# Patient Record
Sex: Female | Born: 1960 | Race: White | Hispanic: No | Smoking: Current every day smoker
Health system: Southern US, Community
[De-identification: ages and names within clinical notes are randomized; demographics above are authoritative.]

## PROBLEM LIST (undated history)

## (undated) DIAGNOSIS — J449 Chronic obstructive pulmonary disease, unspecified: Secondary | ICD-10-CM

## (undated) DIAGNOSIS — I639 Cerebral infarction, unspecified: Secondary | ICD-10-CM

## (undated) DIAGNOSIS — S2239XA Fracture of one rib, unspecified side, initial encounter for closed fracture: Secondary | ICD-10-CM

## (undated) HISTORY — PX: ESOPHAGEAL DILATION: SHX303

---

## 1997-09-11 ENCOUNTER — Emergency Department (HOSPITAL_COMMUNITY): Admission: EM | Admit: 1997-09-11 | Discharge: 1997-09-11 | Payer: Self-pay | Admitting: Emergency Medicine

## 1999-05-05 ENCOUNTER — Emergency Department (HOSPITAL_COMMUNITY): Admission: EM | Admit: 1999-05-05 | Discharge: 1999-05-05 | Payer: Self-pay | Admitting: Emergency Medicine

## 1999-08-30 ENCOUNTER — Ambulatory Visit (HOSPITAL_BASED_OUTPATIENT_CLINIC_OR_DEPARTMENT_OTHER): Admission: RE | Admit: 1999-08-30 | Discharge: 1999-08-30 | Payer: Self-pay | Admitting: *Deleted

## 1999-10-04 ENCOUNTER — Ambulatory Visit (HOSPITAL_BASED_OUTPATIENT_CLINIC_OR_DEPARTMENT_OTHER): Admission: RE | Admit: 1999-10-04 | Discharge: 1999-10-04 | Payer: Self-pay | Admitting: Anesthesiology

## 1999-12-14 ENCOUNTER — Emergency Department (HOSPITAL_COMMUNITY): Admission: EM | Admit: 1999-12-14 | Discharge: 1999-12-14 | Payer: Self-pay | Admitting: Emergency Medicine

## 2002-08-12 ENCOUNTER — Emergency Department (HOSPITAL_COMMUNITY): Admission: EM | Admit: 2002-08-12 | Discharge: 2002-08-12 | Payer: Self-pay | Admitting: Emergency Medicine

## 2003-12-07 ENCOUNTER — Emergency Department (HOSPITAL_COMMUNITY): Admission: EM | Admit: 2003-12-07 | Discharge: 2003-12-07 | Payer: Self-pay | Admitting: Emergency Medicine

## 2005-11-11 ENCOUNTER — Ambulatory Visit (HOSPITAL_COMMUNITY): Admission: RE | Admit: 2005-11-11 | Discharge: 2005-11-11 | Payer: Self-pay | Admitting: Family Medicine

## 2005-12-02 ENCOUNTER — Encounter: Admission: RE | Admit: 2005-12-02 | Discharge: 2005-12-02 | Payer: Self-pay | Admitting: Gastroenterology

## 2005-12-02 IMAGING — CT CT NECK W/ CM
2 of 4 series · 6 of 14 positions shown, 7 images · IV contrast ([ID] OMNI 300)
Comparison: None

CLINICAL DATA: Dysphagia

NECK CT WITH CONTRAST
TECHNIQUE: Multidetector CT imaging of the neck was performed following the
standard protocol during administration of intravenous contrast.
Contrast:  100 cc Omnipaque 300

[Series 2: neck · axial · 0.39mm/px · z∈[+4,+162]mm · 4 of 71 slices shown, 5 images]
[im 15/71  soft-tissue]
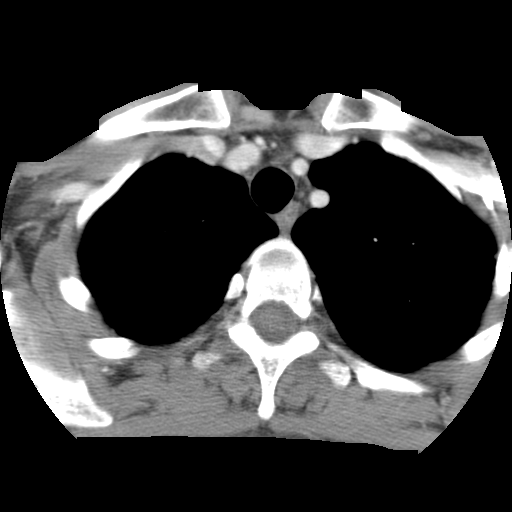
[im 15/71  bone]
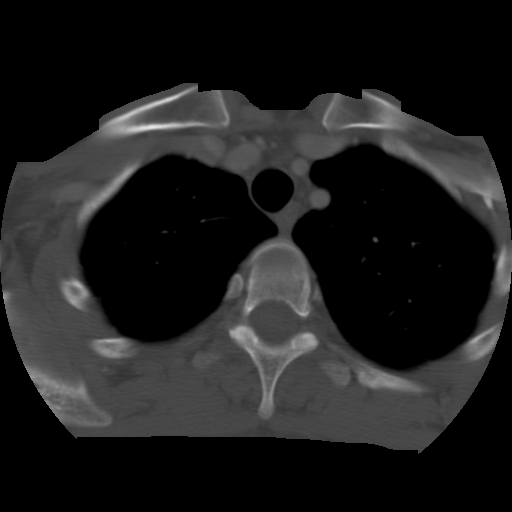
[im 29/71  bone]
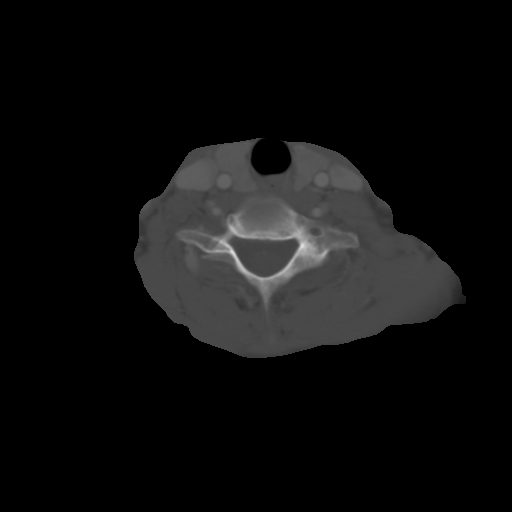
[im 43/71  bone]
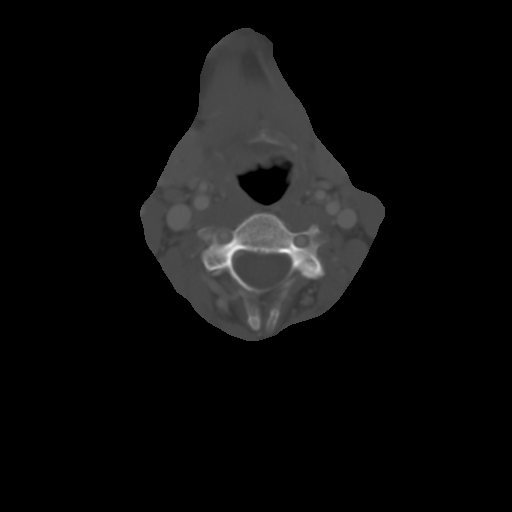
[im 57/71  bone]
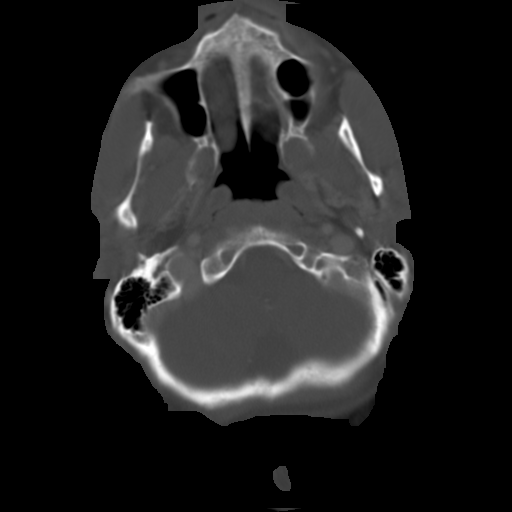

[Series 103: reformatted · sagittal · 0.53mm/px · 2 of 48 slices shown]
[im 16/48  bone]
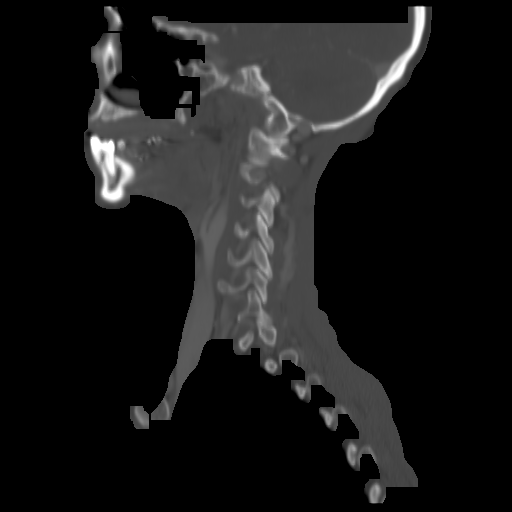
[im 32/48  bone]
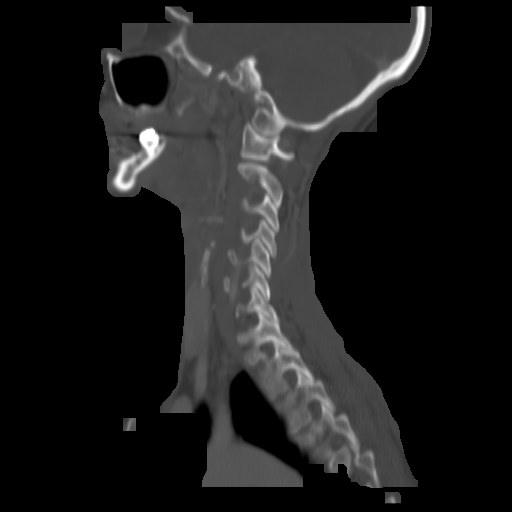

[6 of 14 positions shown; findings below may reference images not displayed]

FINDINGS: The airway is widely patent. The thyroid gland is normal. The upper
esophagus is unremarkable. No cervical adenopathy. Vascular structures are
unremarkable. Lung apices are clear.

IMPRESSION

Negative CT of the neck.

## 2010-04-30 ENCOUNTER — Emergency Department (HOSPITAL_COMMUNITY): Admission: EM | Admit: 2010-04-30 | Discharge: 2010-04-30 | Payer: Self-pay | Admitting: Emergency Medicine

## 2010-04-30 IMAGING — CR DG TOE 4TH 2+V*R*
4 series · 4 of 4 positions shown · non-contrast
Comparison: None.

CLINICAL DATA: Injury and pain.

RIGHT TOE - 2+ VIEW

[t toes ap right]
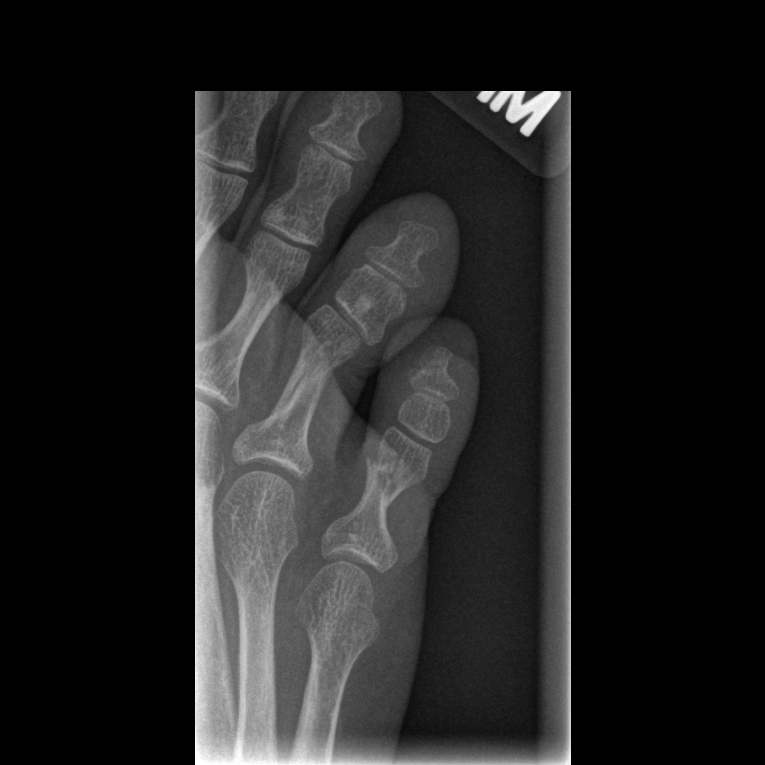

[t toes oblique right (1 of 2)]
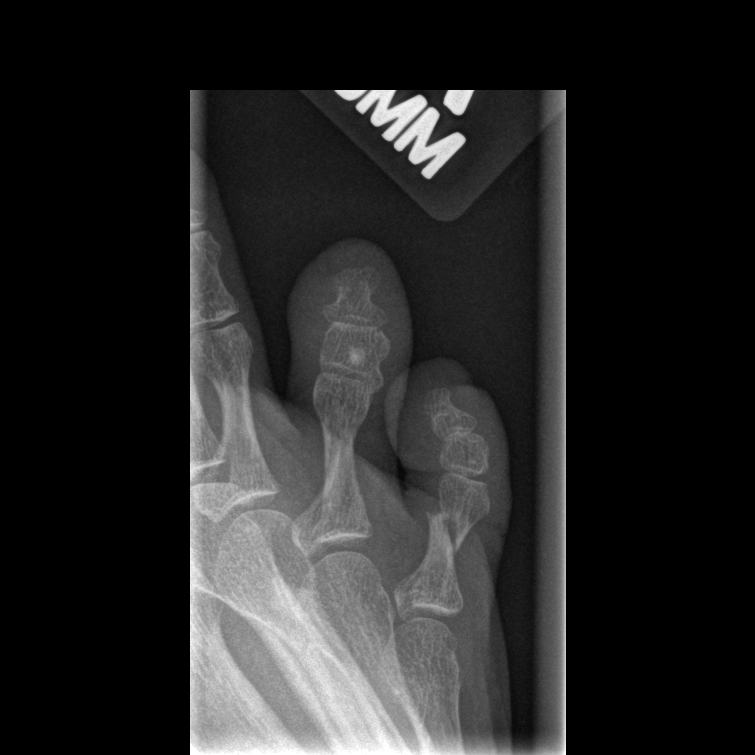

[t toes oblique right (2 of 2)]
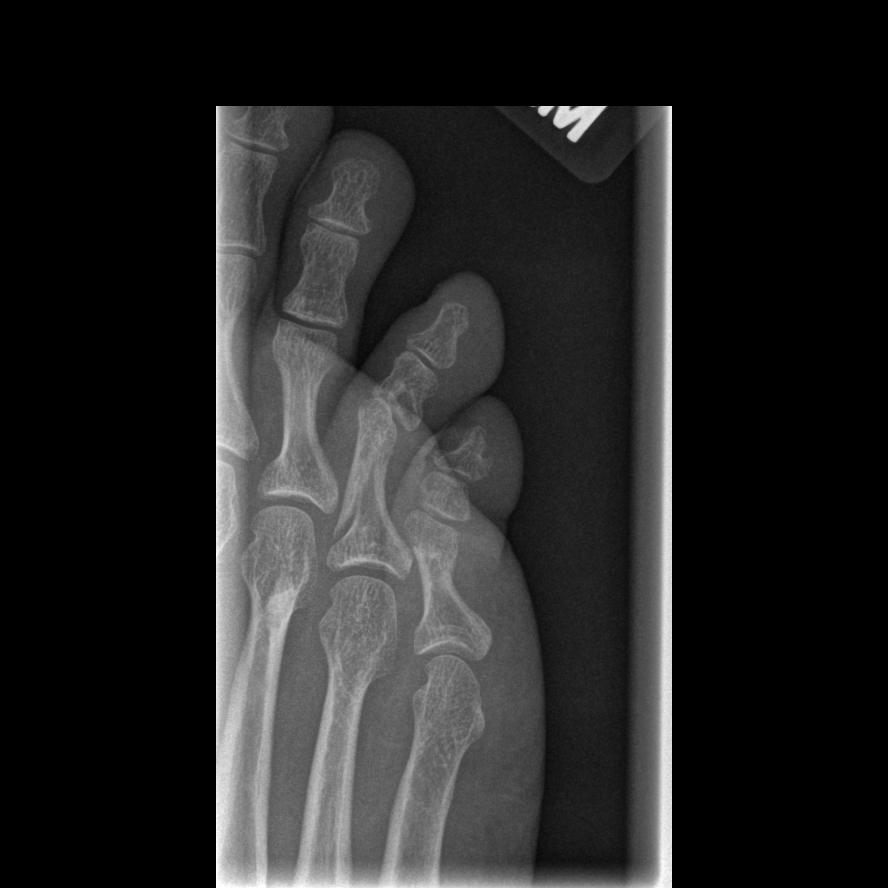

[t toes lateral right]
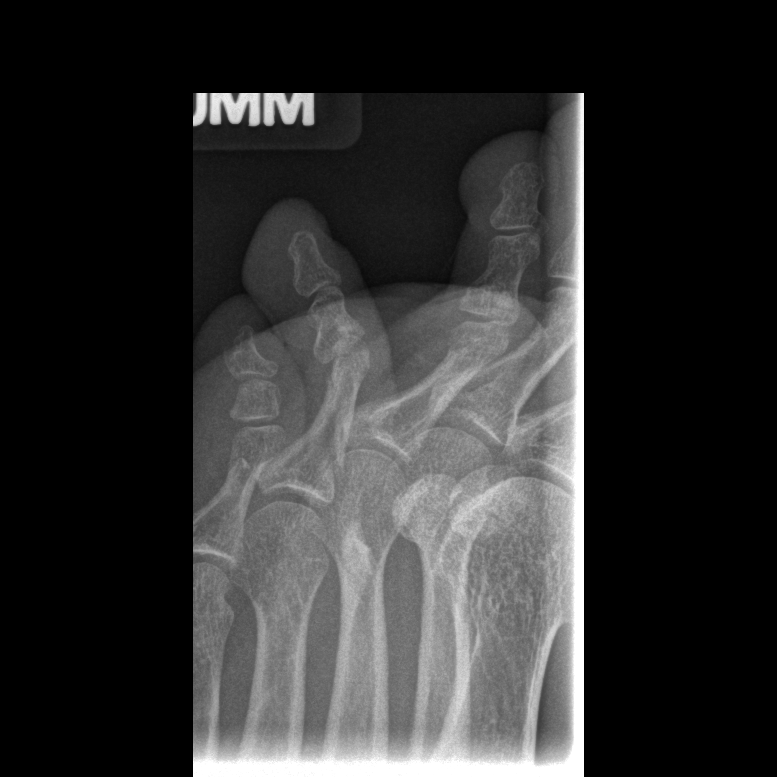

[4 of 4 positions shown; findings below may reference images not displayed]

FINDINGS: There is oblique fracture of the diaphysis of the
proximal phalanx of the right fourth toe.  There is slight medial
displacement and valgus angulation of the distal fracture fragment.
No comminution or dislocation is seen.

There is oblique fracture of the proximal phalanx of the fifth toe.
There is lateral displacement and valgus angulation of the distal
fracture fragment.  No comminution or dislocation is evident.
IMPRESSION: Fractures of the proximal phalanges of the fourth and fifth right
toes.

## 2010-06-27 ENCOUNTER — Encounter: Payer: Self-pay | Admitting: Family Medicine

## 2010-06-27 ENCOUNTER — Encounter: Payer: Self-pay | Admitting: Gastroenterology

## 2011-04-17 ENCOUNTER — Other Ambulatory Visit: Payer: Self-pay

## 2011-04-17 ENCOUNTER — Emergency Department (HOSPITAL_COMMUNITY): Payer: Self-pay

## 2011-04-17 ENCOUNTER — Encounter: Payer: Self-pay | Admitting: Emergency Medicine

## 2011-04-17 ENCOUNTER — Emergency Department (HOSPITAL_COMMUNITY)
Admission: EM | Admit: 2011-04-17 | Discharge: 2011-04-17 | Disposition: A | Discharge status: Home / Self Care | Payer: Self-pay | Attending: Emergency Medicine | Admitting: Emergency Medicine

## 2011-04-17 DIAGNOSIS — R059 Cough, unspecified: Secondary | ICD-10-CM | POA: Insufficient documentation

## 2011-04-17 DIAGNOSIS — R079 Chest pain, unspecified: Secondary | ICD-10-CM | POA: Insufficient documentation

## 2011-04-17 DIAGNOSIS — F17200 Nicotine dependence, unspecified, uncomplicated: Secondary | ICD-10-CM | POA: Insufficient documentation

## 2011-04-17 DIAGNOSIS — K219 Gastro-esophageal reflux disease without esophagitis: Secondary | ICD-10-CM | POA: Insufficient documentation

## 2011-04-17 DIAGNOSIS — S2239XA Fracture of one rib, unspecified side, initial encounter for closed fracture: Secondary | ICD-10-CM | POA: Insufficient documentation

## 2011-04-17 DIAGNOSIS — J029 Acute pharyngitis, unspecified: Secondary | ICD-10-CM | POA: Insufficient documentation

## 2011-04-17 DIAGNOSIS — X58XXXA Exposure to other specified factors, initial encounter: Secondary | ICD-10-CM | POA: Insufficient documentation

## 2011-04-17 DIAGNOSIS — R0989 Other specified symptoms and signs involving the circulatory and respiratory systems: Secondary | ICD-10-CM | POA: Insufficient documentation

## 2011-04-17 DIAGNOSIS — R05 Cough: Secondary | ICD-10-CM | POA: Insufficient documentation

## 2011-04-17 LAB — GLUCOSE, CAPILLARY

## 2011-04-17 IMAGING — CR DG CHEST 2V
2 series · 2 of 2 positions shown · non-contrast
Comparison: None.

CLINICAL DATA: Cough and left-sided chest pain.  Chest congestion.
Fever.

CHEST - 2 VIEW

[w chest pa]
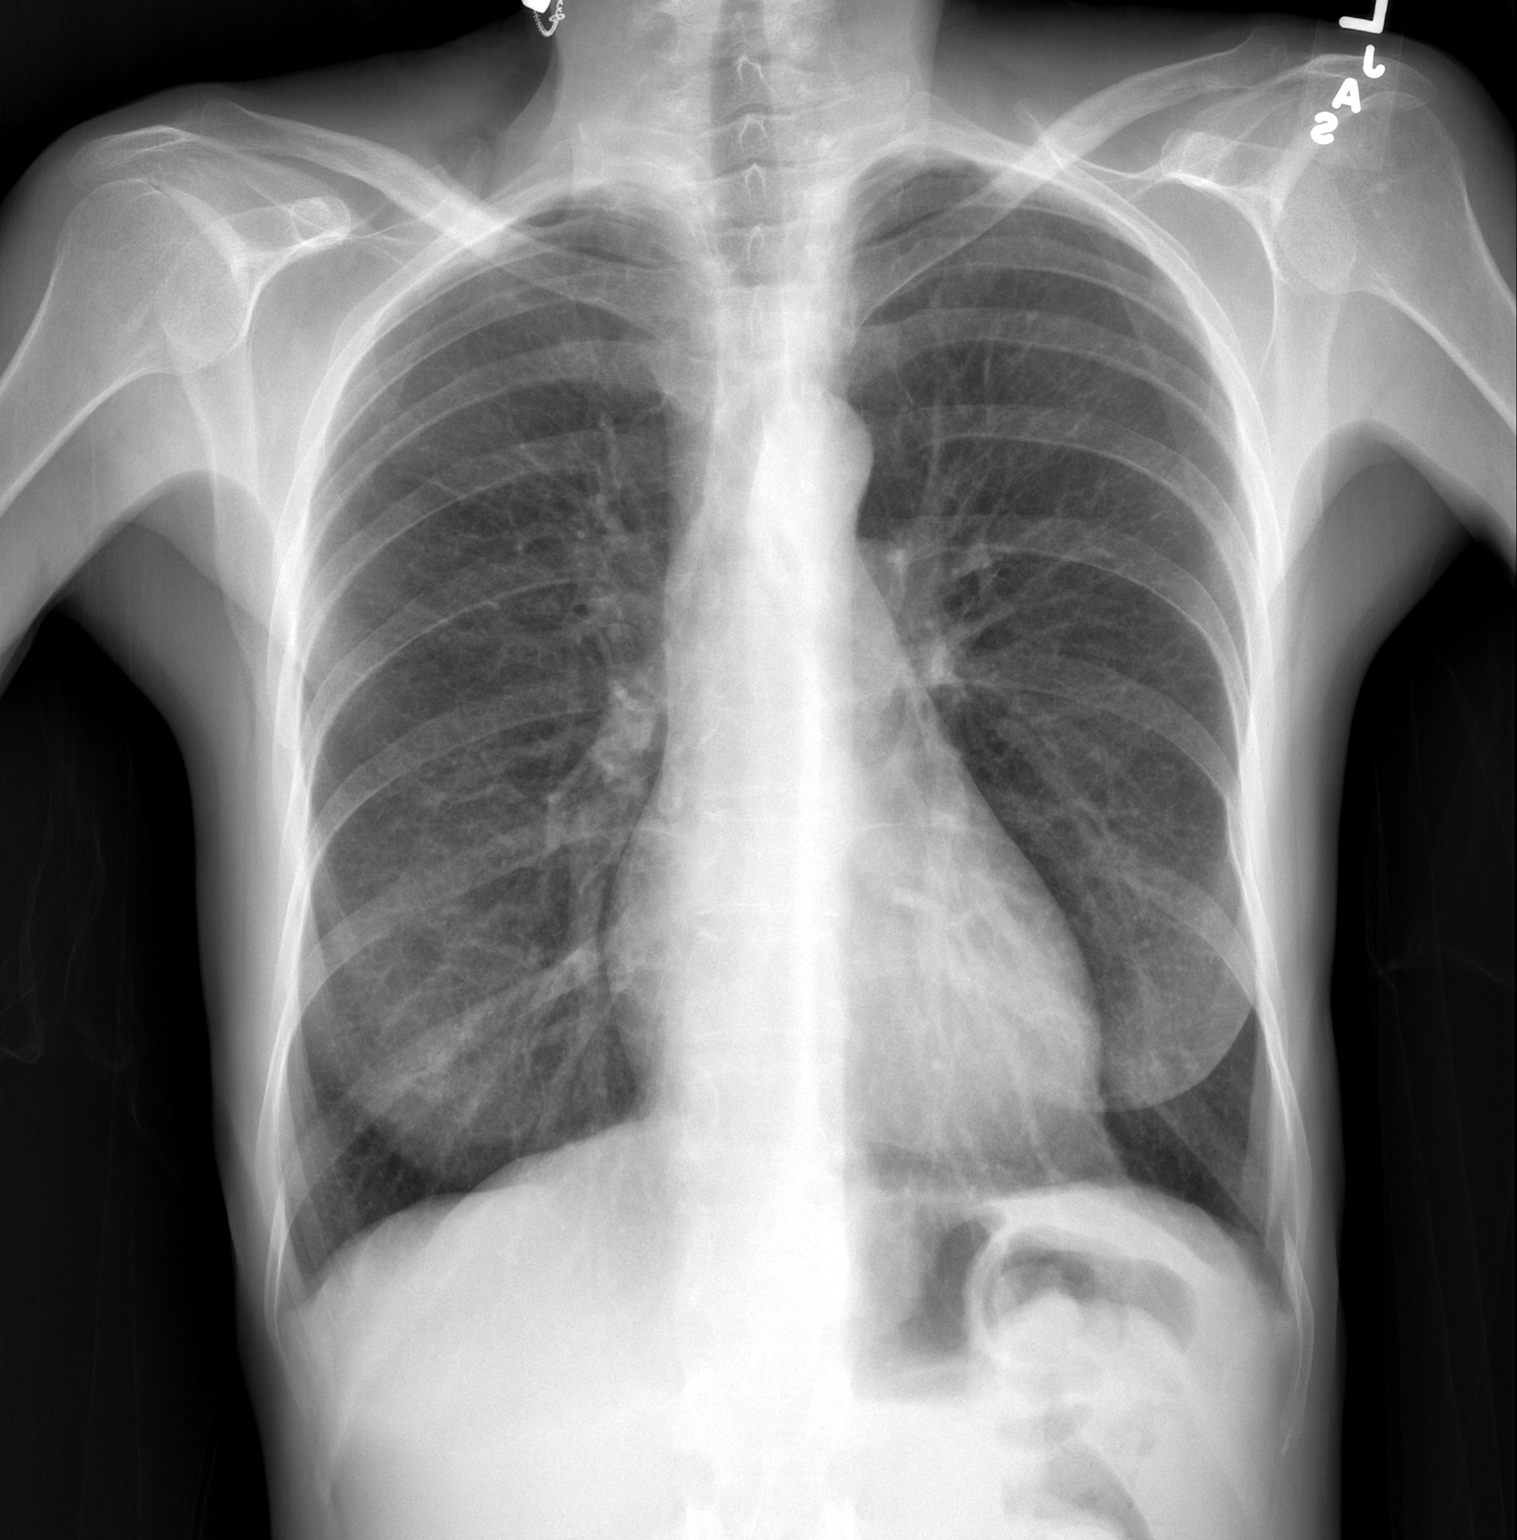

[w chest lat]
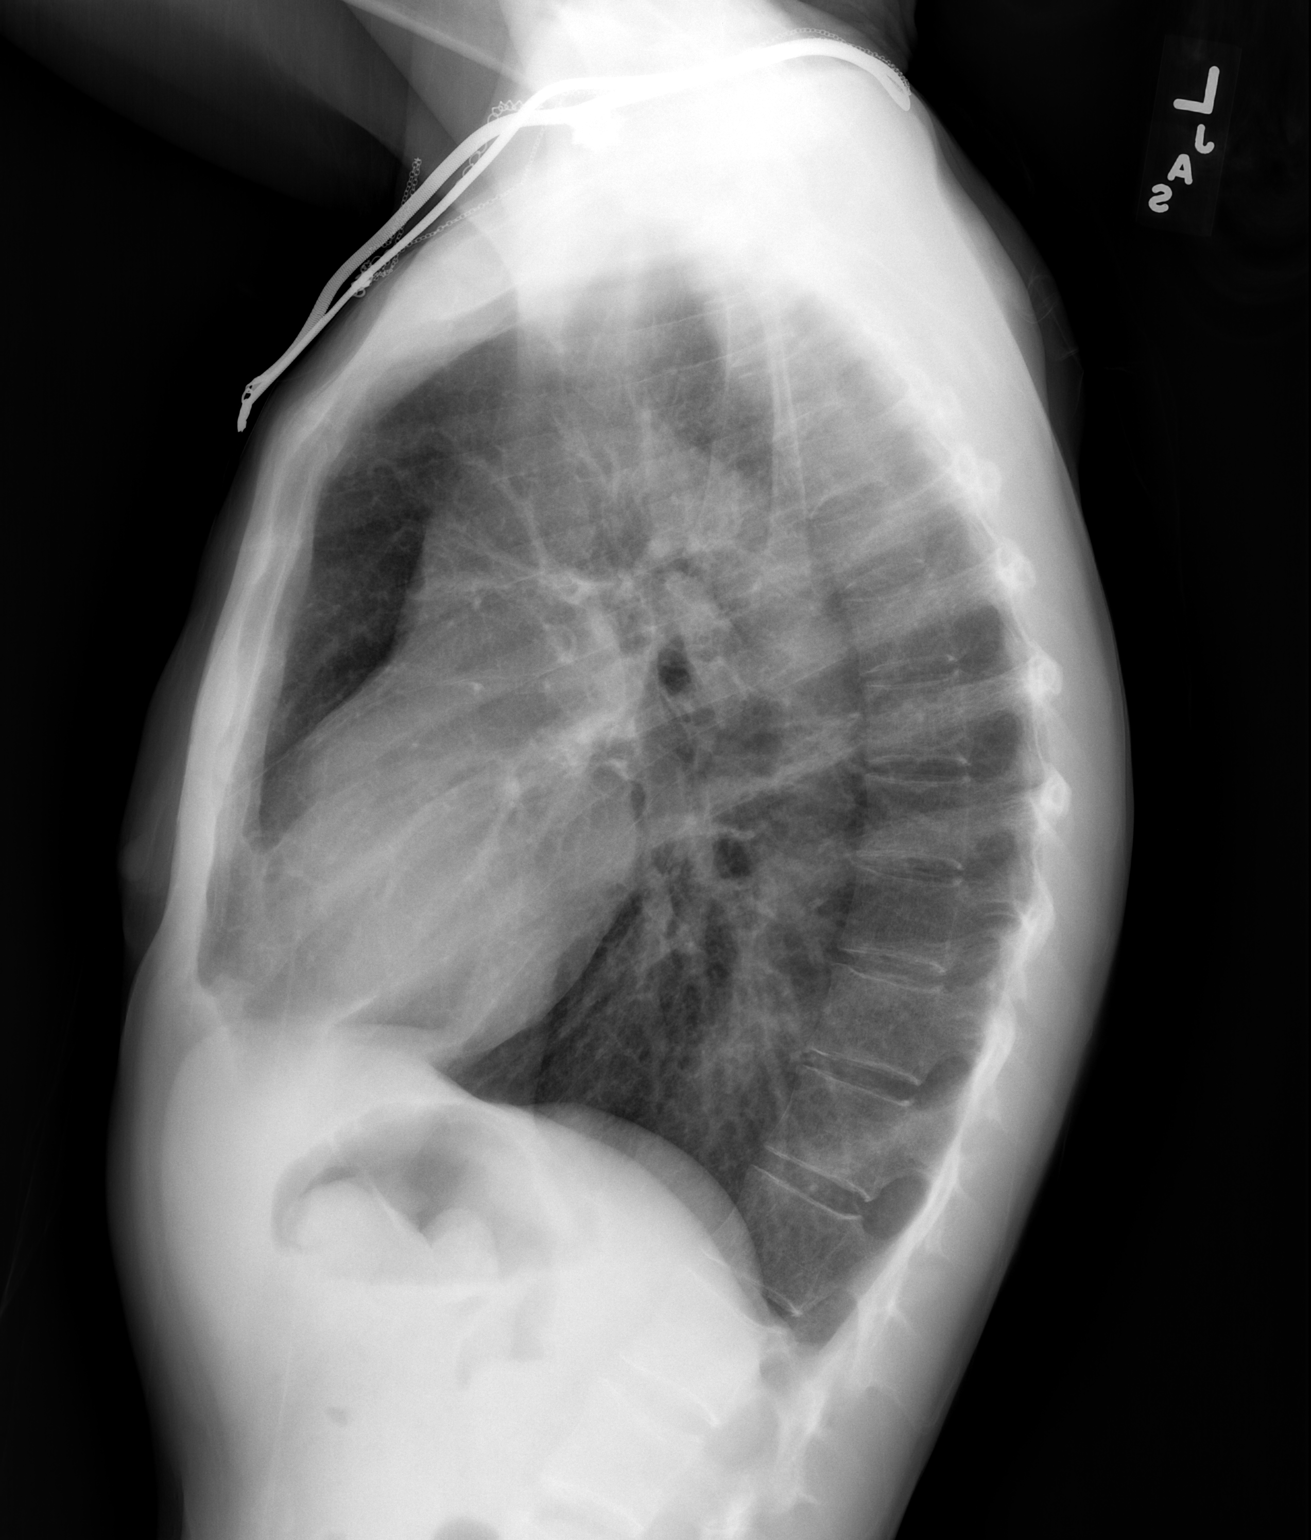

[2 of 2 positions shown; findings below may reference images not displayed]

FINDINGS: The heart size and vascularity are normal and the lungs
are clear although somewhat hyperinflated.

There is a deformity of the lateral aspect of the left eighth rib
which could be due to an acute or old rib fracture but I do not see
any callus formation.

No pneumothorax.
IMPRESSION: Deformity of the left eighth rib laterally which may represent an
acute fracture.

Hyperinflated lungs.

## 2011-04-17 MED ORDER — HYDROCODONE-ACETAMINOPHEN 5-325 MG PO TABS: 2.0000 | ORAL_TABLET | ORAL | Status: AC | PRN

## 2011-04-17 MED ORDER — OMEPRAZOLE 20 MG PO CPDR: 20.0000 mg | DELAYED_RELEASE_CAPSULE | Freq: Every day | ORAL | Status: DC

## 2011-04-17 NOTE — Discharge Instructions (Signed)
Below is a list of medical providers for people who are not currently connected with a physician.  Please utilize it to find a doctor.   RESOURCE GUIDE  Dental Problems  Patients with Medicaid: [facility] [phone]                                           [phone] [address] Phone:  [phone]                                                  Phone:  [phone]  If unable to pay or uninsured, contact:  [facility] or [facility]. to become qualified for the adult dental clinic.  Chronic Pain Problems Contact [facility]  [phone] Patients need to be referred by their primary care doctor.  Insufficient Money for Medicine Contact [employer]:  call "211" or [facility] [phone].  No Primary Care Doctor Call [facility]  [phone] Other agencies that provide inexpensive medical care    [facility]  [phone]    [facility]  [phone]    [facility]  [phone]    [facility]  [phone]    [facility]  [phone]    [facility]  [phone]  Psychological Services [facility]  [phone] [facility] Services  [phone] [facility]   [phone] (emergency services [phone])  Substance Abuse Resources [facility]  [phone] [facility] [phone] [facility] [phone] Floydene Flock [phone] Residential & Outpatient Substance Abuse Program  [phone]  Abuse/Neglect [facility] Child Abuse Hotline [phone] [facility] Child Abuse Hotline (657)595-7708 (After Hours)  Emergency Shelter [facility] 458 600 2011  Maternity Homes [facility] at the [place] [facility] [place] [phone] [facility] [phone]  MRSA Hotline #:   405-324-5944    [facility] Resources  [facility] [place]      [employer]                          [place] Dept. [address] [place]                       [address]      [address]  [place]                                                Cristobal Goldmann Phone:  [phone]                                   Phone:  [phone]  Phone:  [place] Phone:  [place] [phone] (After Hours)

## 2011-04-17 NOTE — ED Provider Notes (Signed)
History     CSN: [id number] Arrival date & time: 04/17/2011  1:03 PM   First MD Initiated Contact with Patient 04/17/11 1636      Chief Complaint  Patient presents with  . Sore Throat    (Consider location/radiation/quality/duration/timing/severity/associated sxs/prior treatment) Patient is a 50 y.o. female presenting with pharyngitis. The history is provided by the patient.  Sore Throat The current episode started more than 2 days ago. The problem has been gradually worsening. Associated symptoms include chest pain. Pertinent negatives include no shortness of breath. The symptoms are aggravated by coughing and smoking. The symptoms are relieved by nothing.    History reviewed. No pertinent past medical history.  Past Surgical History  Procedure Date  . Esophageal dilation     No family history on file.  History  Substance Use Topics  . Smoking status: Current Everyday Smoker  . Smokeless tobacco: Not on file  . Alcohol Use: Yes    OB History    Grav Para Term Preterm Abortions TAB SAB Ect Mult Living                  Review of Systems  Constitutional: Negative for fever.  Eyes: Negative.   Respiratory: Positive for cough. Negative for shortness of breath.   Cardiovascular: Positive for chest pain.  Genitourinary: Negative.   All other systems reviewed and are negative.    Allergies  Review of patient's allergies indicates no known allergies.  Home Medications   Current Outpatient Rx  Name Route Sig Dispense Refill  . THERA M PLUS PO TABS Oral Take 1 tablet by mouth daily.      Marland Kitchen VITAMIN B-12 100 MCG PO TABS Oral Take 50 mcg by mouth daily.        BP 102/73  Pulse 79  Temp(Src) 98.9 F (37.2 C) (Oral)  Resp 16  SpO2 100%  Physical Exam  Nursing note and vitals reviewed. Constitutional: She is oriented to person, place, and time. She appears well-developed and well-nourished. No distress.  HENT:  Head: Normocephalic and atraumatic.  Eyes:  Pupils are equal, round, and reactive to light.  Neck: Normal range of motion.  Cardiovascular: Normal rate and intact distal pulses.          Date: 04/17/2011  Rate: 85  Rhythm: normal sinus rhythm  QRS Axis: normal  Intervals: normal  ST/T Wave abnormalities: normal  Conduction Disutrbances:none  Narrative Interpretation: Normal EKG  Old EKG Reviewed: none available     Pulmonary/Chest: No respiratory distress.  Abdominal: Normal appearance. She exhibits no distension.  Musculoskeletal: Normal range of motion.  Neurological: She is alert and oriented to person, place, and time. No cranial nerve deficit.  Skin: Skin is warm and dry. No rash noted.  Psychiatric: She has a normal mood and affect. Her behavior is normal.    ED Course  Procedures (including critical care time)  Labs Reviewed  GLUCOSE, CAPILLARY - Abnormal; Notable for the following:    Glucose-Capillary 128 (*)    All other components within normal limits  POCT CBG MONITORING   Dg Chest 2 View  04/17/2011  *RADIOLOGY REPORT*  Clinical Data: Cough and left-sided chest pain.  Chest congestion. Fever.  CHEST - 2 VIEW  Comparison: None.  Findings: The heart size and vascularity are normal and the lungs are clear although somewhat hyperinflated.  There is a deformity of the lateral aspect of the left eighth rib which could be due to an acute or old rib  fracture but I do not see any callus formation.  No pneumothorax.  IMPRESSION: Deformity of the left eighth rib laterally which may represent an acute fracture.  Hyperinflated lungs.  Original Report Authenticated By: Gwynn Burly, M.D.     1. GERD (gastroesophageal reflux disease)   2. Rib fracture       MDM          Nelia Shi, MD 04/17/11 [phone]

## 2011-04-17 NOTE — ED Notes (Signed)
Pt reports sore throat, chest pain and shortness of breath.

## 2011-08-10 ENCOUNTER — Emergency Department (HOSPITAL_COMMUNITY)
Admission: EM | Admit: 2011-08-10 | Discharge: 2011-08-10 | Disposition: A | Discharge status: Home Health | Payer: Self-pay | Attending: Emergency Medicine | Admitting: Emergency Medicine

## 2011-08-10 ENCOUNTER — Emergency Department (HOSPITAL_COMMUNITY): Payer: Self-pay

## 2011-08-10 ENCOUNTER — Encounter (HOSPITAL_COMMUNITY): Payer: Self-pay | Admitting: *Deleted

## 2011-08-10 DIAGNOSIS — Z720 Tobacco use: Secondary | ICD-10-CM

## 2011-08-10 DIAGNOSIS — R059 Cough, unspecified: Secondary | ICD-10-CM | POA: Insufficient documentation

## 2011-08-10 DIAGNOSIS — R42 Dizziness and giddiness: Secondary | ICD-10-CM | POA: Insufficient documentation

## 2011-08-10 DIAGNOSIS — R0602 Shortness of breath: Secondary | ICD-10-CM | POA: Insufficient documentation

## 2011-08-10 DIAGNOSIS — R5381 Other malaise: Secondary | ICD-10-CM | POA: Insufficient documentation

## 2011-08-10 DIAGNOSIS — R6883 Chills (without fever): Secondary | ICD-10-CM | POA: Insufficient documentation

## 2011-08-10 DIAGNOSIS — R05 Cough: Secondary | ICD-10-CM | POA: Insufficient documentation

## 2011-08-10 DIAGNOSIS — F17200 Nicotine dependence, unspecified, uncomplicated: Secondary | ICD-10-CM | POA: Insufficient documentation

## 2011-08-10 DIAGNOSIS — N390 Urinary tract infection, site not specified: Secondary | ICD-10-CM | POA: Insufficient documentation

## 2011-08-10 DIAGNOSIS — J40 Bronchitis, not specified as acute or chronic: Secondary | ICD-10-CM | POA: Insufficient documentation

## 2011-08-10 HISTORY — DX: Fracture of one rib, unspecified side, initial encounter for closed fracture: S22.39XA

## 2011-08-10 LAB — URINE MICROSCOPIC-ADD ON

## 2011-08-10 LAB — POCT I-STAT, CHEM 8
Calcium, Ion: 1.25 mmol/L (ref 1.12–1.32)
Chloride: 107 mEq/L (ref 96–112)
HCT: 34 % — ABNORMAL LOW (ref 36.0–46.0)
Potassium: 3.3 mEq/L — ABNORMAL LOW (ref 3.5–5.1)

## 2011-08-10 LAB — URINALYSIS, ROUTINE W REFLEX MICROSCOPIC
Leukocytes, UA: NEGATIVE
Nitrite: POSITIVE — AB
Specific Gravity, Urine: 1.028 (ref 1.005–1.030)
pH: 5.5 (ref 5.0–8.0)

## 2011-08-10 IMAGING — CR DG CHEST 2V
2 series · 2 of 2 positions shown · non-contrast
Comparison: Chest x-ray [DATE].

CLINICAL DATA: Cough, shortness of breath.  History of smoking.

CHEST - 2 VIEW

[w chest pa]
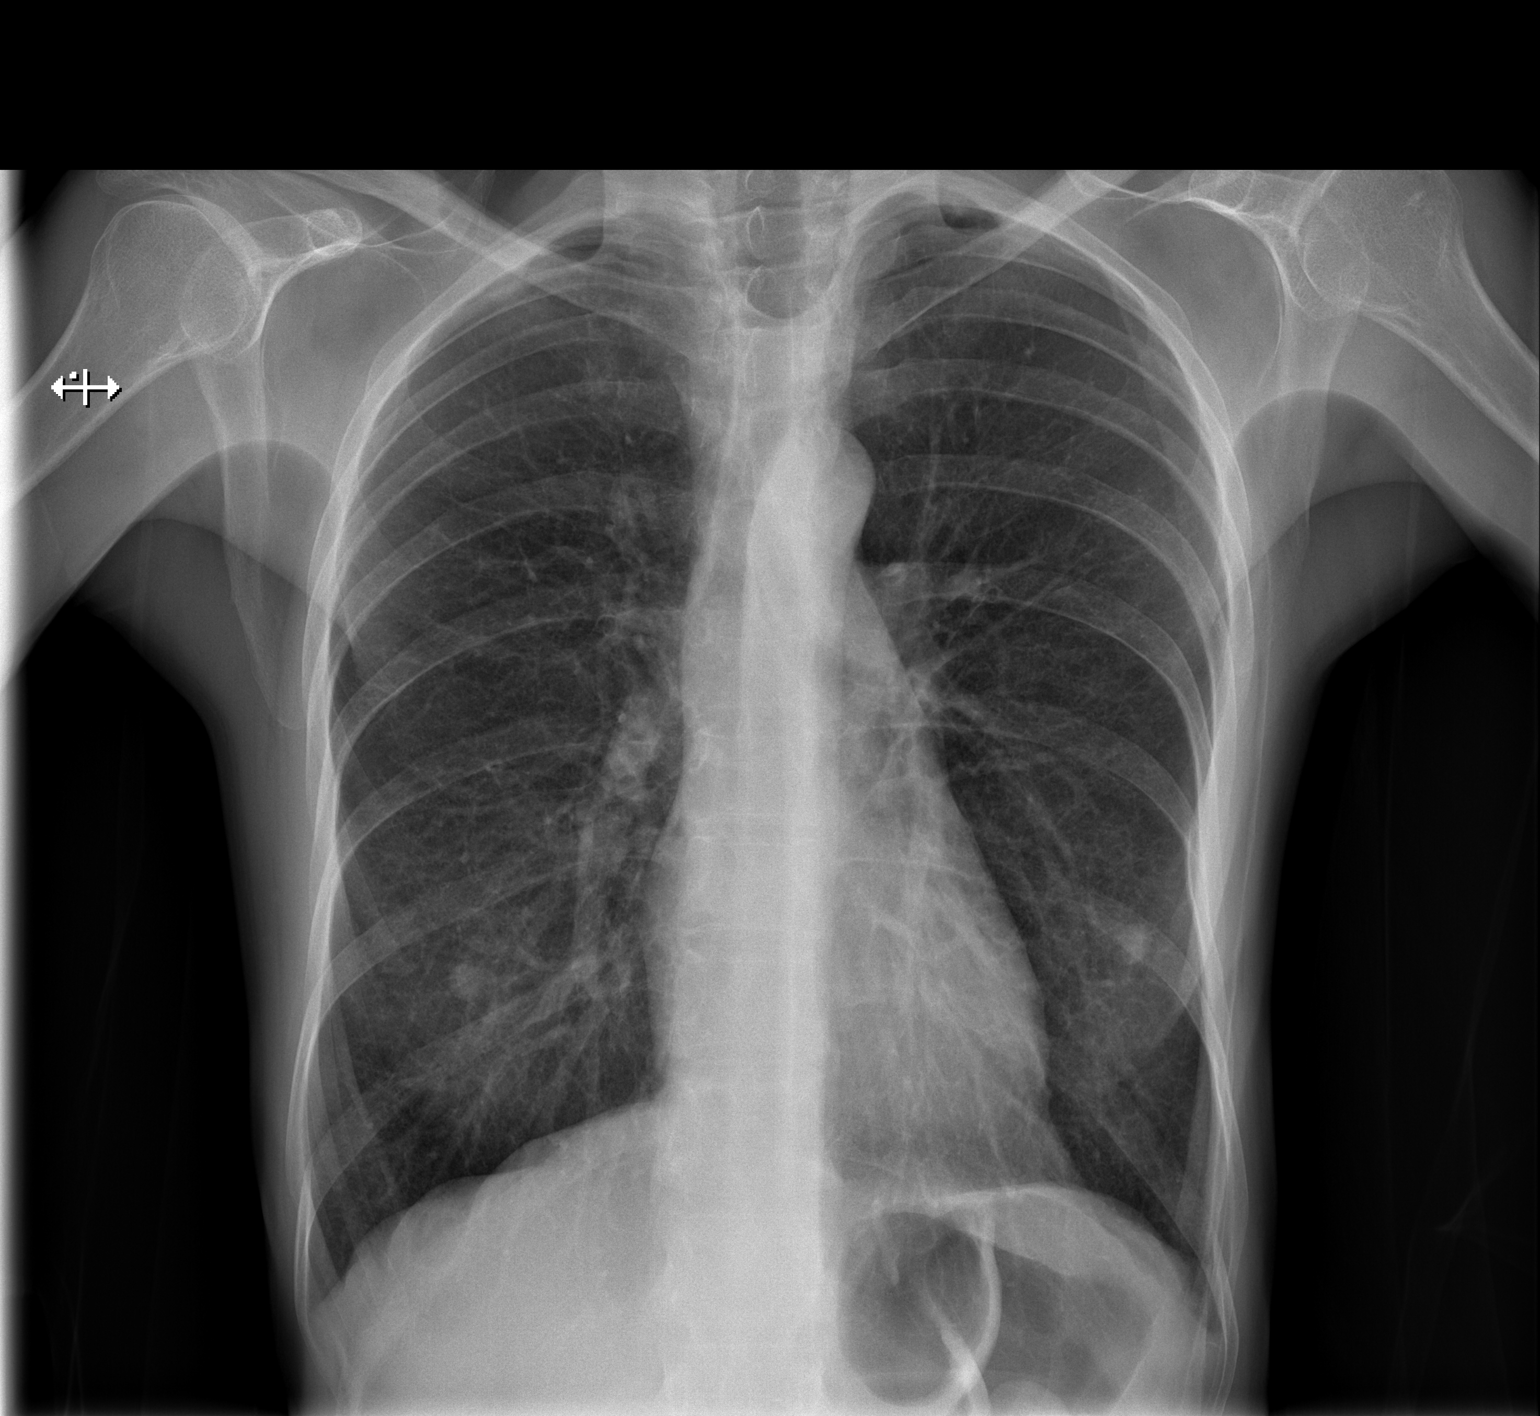

[w chest lat]
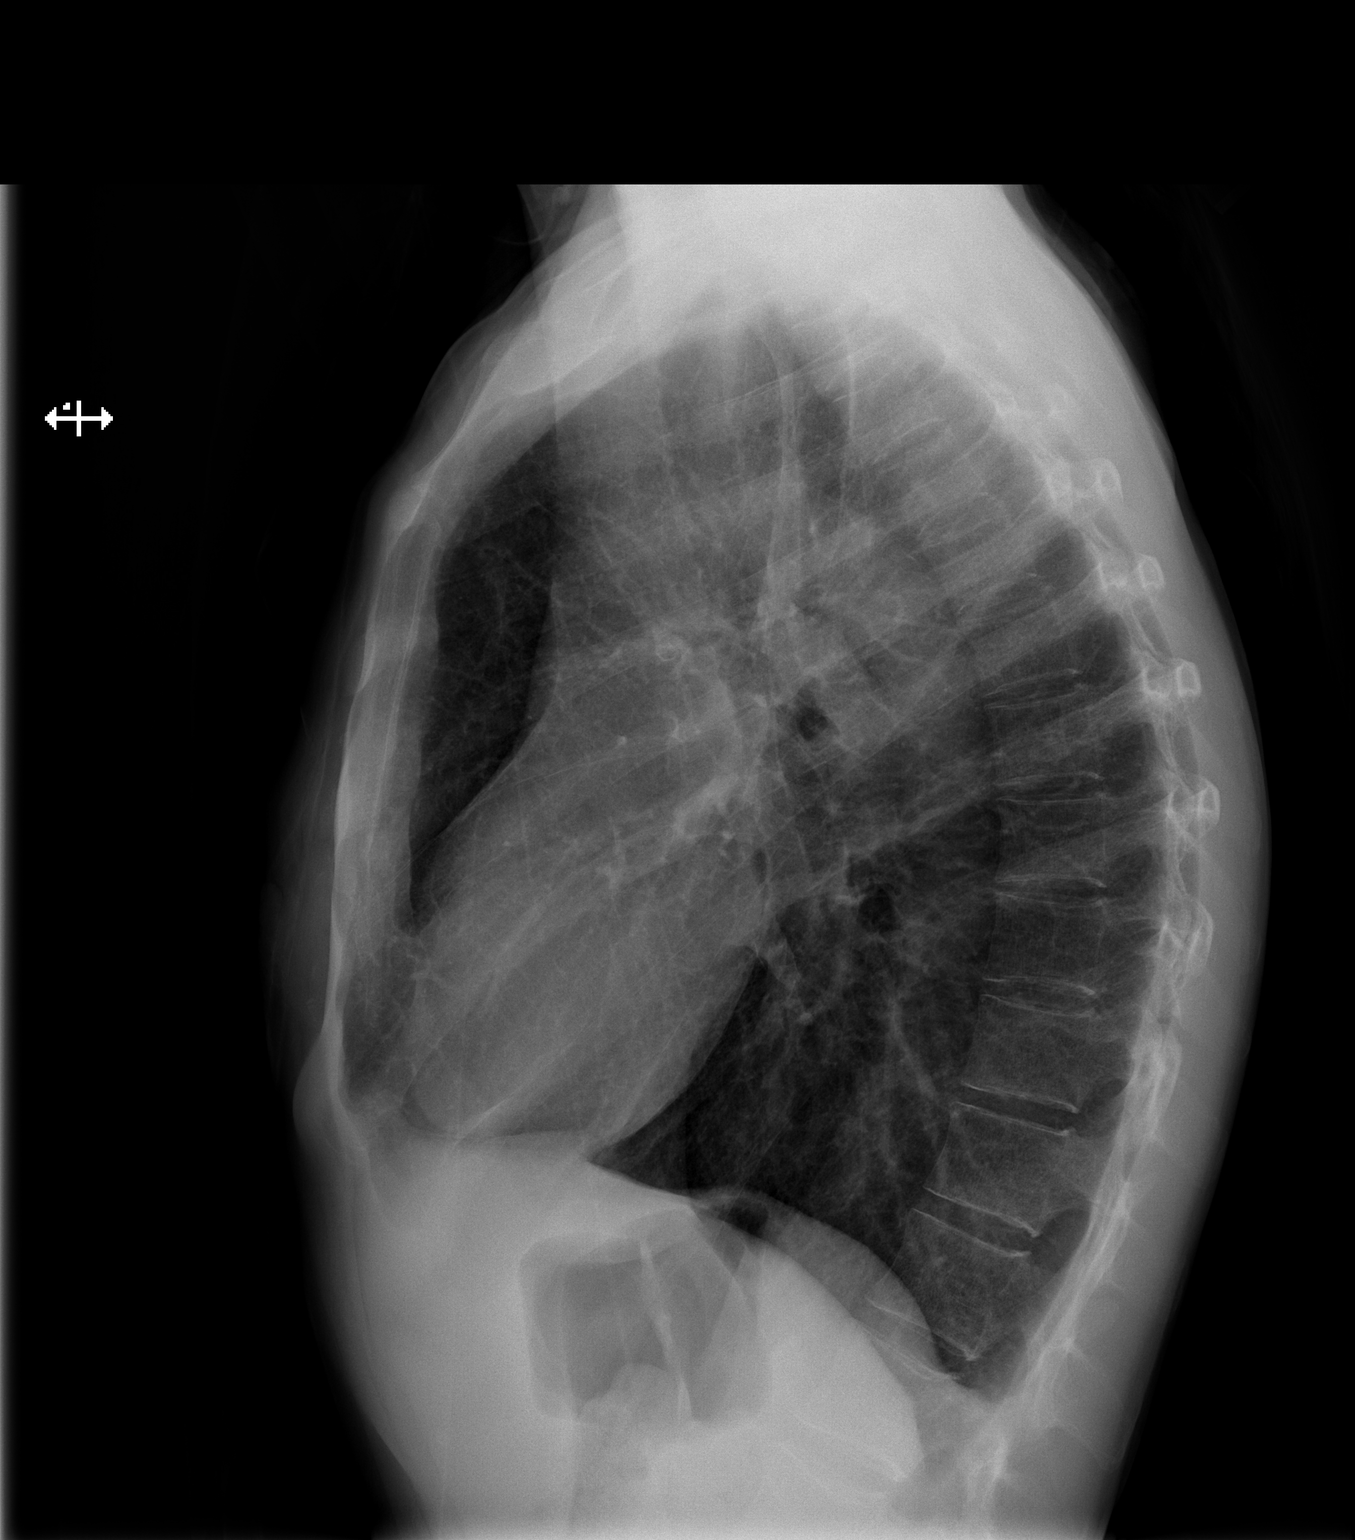

[2 of 2 positions shown; findings below may reference images not displayed]

FINDINGS: Lungs appear hyperexpanded with flattening of the
hemidiaphragms, increased retrosternal air space and pruning of the
pulmonary vasculature in the periphery, suggestive of underlying
COPD.  No consolidative airspace disease.  No pleural effusions.
Prominent symmetric nodular opacities project over the mid - lower
lungs bilaterally, most consistent with nipple shadows.  No other
suspicious appearing pulmonary nodules or masses are otherwise
identified.  Cardiomediastinal silhouette is within normal limits.
IMPRESSION: 1.  Changes of COPD redemonstrated, as above, without radiographic
evidence of acute cardiopulmonary disease.

## 2011-08-10 MED ORDER — CEPHALEXIN 250 MG PO CAPS: 250.0000 mg | ORAL_CAPSULE | Freq: Four times a day (QID) | ORAL | Status: AC

## 2011-08-10 MED ORDER — ALBUTEROL SULFATE HFA 108 (90 BASE) MCG/ACT IN AERS
1.0000 | INHALATION_SPRAY | Freq: Four times a day (QID) | RESPIRATORY_TRACT | Status: DC | PRN
Start: 2011-08-10 — End: 2013-03-21

## 2011-08-10 NOTE — ED Provider Notes (Signed)
Medical screening examination/treatment/procedure(s) were performed by non-physician practitioner and as supervising physician I was immediately available for consultation/collaboration.    Celene Kras, MD 08/10/11 [phone]

## 2011-08-10 NOTE — ED Notes (Signed)
Pt has had a productive (yellow) cough since Friday night.  Pt states that she has had some sob with this and dizziness.  Pt also would like to be checked for a UTI due to malodorous urine.  No pain with this at this time

## 2011-08-10 NOTE — Discharge Instructions (Signed)
Use inhaler as directed, as needed for cough and quit smoking. Take antibiotic as directed for full course. Stay well hydrated. Establishment with a Primary Care provider is Very important for general health care concerns, minor illness and minor injury and for recheck of urine for ongoing symptoms Return to ER for emergent changing or worsening of symptoms.  Bronchitis Bronchitis is a problem of the air tubes leading to your lungs. This problem makes it hard for air to get in and out of the lungs. You may cough a lot because your air tubes are narrow. Going without care can cause lasting (chronic) bronchitis. HOME CARE   Drink enough fluids to keep your pee (urine) clear or pale yellow.   Use a cool mist humidifier.   Quit smoking if you smoke. If you keep smoking, the bronchitis might not get better.   Only take medicine as told by your doctor.  GET HELP RIGHT AWAY IF:   Coughing keeps you awake.   You start to wheeze.   You become more sick or weak.   You have a hard time breathing or get short of breath.   You cough up blood.   Coughing lasts more than 2 weeks.   You have a fever.   Your baby is older than 3 months with a rectal temperature of 102 F (38.9 C) or higher.   Your baby is 62 months old or younger with a rectal temperature of 100.4 F (38 C) or higher.  MAKE SURE YOU:  Understand these instructions.   Will watch your condition.   Will get help right away if you are not doing well or get worse.  Document Released: 11/09/2007 Document Revised: 05/12/2011 Document Reviewed: 04/24/2009 [facility] Patient Information 2012 [place], [place].  Chronic Obstructive Pulmonary Disease Chronic obstructive pulmonary disease (COPD) is a lung disease. The lungs become damaged, making it hard to get air in and out of your lungs. The damage to your lungs cannot be changed.  HOME CARE  Stop smoking if you smoke. Avoid secondhand smoke.   Only take medicine as told by your  doctor.   Talk to your doctor about using cough syrup or over-the-counter medicines.   Drink enough fluids to keep your pee (urine) clear or pale yellow.   Use a humidifier or vaporizer. This may help loosen the thick spit (mucus).   Talk to your doctor about vaccines that help prevent other lung problems (pneumonia and flu vaccines).   Use home oxygen as told by your doctor.   Stay active and exercise.   Eat healthy foods.  GET HELP RIGHT AWAY IF:   Your heart is beating fast.   You become disturbed, confused, shake, or are dazed.   You have trouble breathing.   You have chest pain.   You have a fever.   You cough up thick spit that is yellowish-white or green.   Your breathing becomes worse when you exercise.   You are running out of the medicine you take for your breathing.  MAKE SURE YOU:   Understand these instructions.   Will watch your condition.   Will get help right away if you are not doing well or get worse.  Document Released: 11/09/2007 Document Revised: 05/12/2011 Document Reviewed: 07/23/2010 [facility] Patient Information 2012 [place], [place].  Urinary Tract Infection A urinary tract infection (UTI) is often caused by a germ (bacteria). A UTI is usually helped with medicine (antibiotics) that kills germs. Take all the medicine until it  is gone. Do this even if you are feeling better. You are usually better in 7 to 10 days. HOME CARE   Drink enough water and fluids to keep your pee (urine) clear or pale yellow. Drink:   Cranberry juice.   Water.   Avoid:   Caffeine.   Tea.   Bubbly (carbonated) drinks.   Alcohol.   Only take medicine as told by your doctor.   To prevent further infections:   Pee often.   After pooping (bowel movement), women should wipe from front to back. Use each tissue only once.   Pee before and after having sex (intercourse).  Ask your doctor when your test results will be ready. Make sure you follow up and get  your test results.  GET HELP RIGHT AWAY IF:   There is very bad back pain or lower belly (abdominal) pain.   You get the chills.   You have a fever.   Your baby is older than 3 months with a rectal temperature of 102 F (38.9 C) or higher.   Your baby is 41 months old or younger with a rectal temperature of 100.4 F (38 C) or higher.   You feel sick to your stomach (nauseous) or throw up (vomit).   There is continued burning with peeing.   Your problems are not better in 3 days. Return sooner if you are getting worse.  MAKE SURE YOU:   Understand these instructions.   Will watch your condition.   Will get help right away if you are not doing well or get worse.  Document Released: 11/09/2007 Document Revised: 05/12/2011 Document Reviewed: 11/09/2007 [facility] Patient Information 2012 [place], [place].

## 2011-08-10 NOTE — ED Provider Notes (Signed)
History     CSN: [id number]  Arrival date & time 08/10/11  8119   First MD Initiated Contact with Patient 08/10/11 [phone]      Chief Complaint  Patient presents with  . 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 She is post menopausal x 1 year. She denies known fevers but has felt chilled. She denies HA, visual changes, neck stiffness, sore throat, earache, CP, hemoptysis, abdominal pain, rash, dysuria, hematuria, blood in stool. She smokes tobacco which she states has aggravated cough. She has taken no medication PTA to treat symptoms.   Past Medical History  Diagnosis Date  . Rib fracture     4 months ago    Past Surgical History  Procedure Date  . Esophageal dilation     No family history on file.  History  Substance Use Topics  . Smoking status: Current Everyday Smoker -- 1.0 packs/day  . Smokeless tobacco: Not on file  . Alcohol Use: Yes    OB History    Grav Para Term Preterm Abortions TAB SAB Ect Mult Living                  Review of Systems  All other systems reviewed and are negative.    Allergies  Codeine  Home Medications   Current Outpatient Rx  Name Route Sig Dispense Refill  . THERA M PLUS PO TABS Oral Take 1 tablet by mouth daily.      Marland Kitchen VITAMIN B-12 100 MCG PO TABS Oral Take 50 mcg by mouth daily.        BP 109/66  Pulse 99  Temp(Src) 98.5 F (36.9 C) (Oral)  Resp 16  SpO2 99%  Physical Exam  Nursing note and vitals reviewed. Constitutional: She is oriented to person, place,  and time. She appears well-developed and well-nourished. No distress.       Thin appearing.   HENT:  Head: Normocephalic and atraumatic.  Mouth/Throat: No oropharyngeal exudate.       Chapped dry lips but moist mucus membranes  Eyes: Conjunctivae and EOM are normal. Pupils are equal, round, and reactive to light.  Neck: Normal range of motion. Neck supple.  Cardiovascular: Normal rate, regular rhythm, normal heart sounds and intact distal pulses.  Exam reveals no gallop and no friction rub.   No murmur heard. Pulmonary/Chest: Effort normal and breath sounds normal. No respiratory distress. She has no wheezes. She has no rales. She exhibits no tenderness.  Abdominal: Soft. Bowel sounds are normal. She exhibits no distension and no mass. There is no tenderness. There is no rebound and no guarding.       No CVA TTP  Musculoskeletal: Normal range of motion. She exhibits no edema and no tenderness.  Neurological: She is alert and oriented to person, place, and time.  Skin: Skin is warm and dry. No rash noted. She is not diaphoretic. No erythema.  Psychiatric: She has a normal mood and affect.    ED Course  Procedures (including critical care time)  Labs Reviewed  URINALYSIS, ROUTINE  W REFLEX MICROSCOPIC - Abnormal; Notable for the following:    Color, Urine AMBER (*) BIOCHEMICALS MAY BE AFFECTED BY COLOR   APPearance CLOUDY (*)    Bilirubin Urine SMALL (*)    Ketones, ur 15 (*)    Nitrite POSITIVE (*)    All other components within normal limits  POCT I-STAT, CHEM 8 - Abnormal; Notable for the following:    Potassium 3.3 (*)    Glucose, Bld 100 (*)    Hemoglobin 11.6 (*)    HCT 34.0 (*)    All other components within normal limits  URINE MICROSCOPIC-ADD ON - Abnormal; Notable for the following:    Squamous Epithelial / LPF FEW (*)    Bacteria, UA MANY (*)    All other components within normal limits   Dg Chest 2 View  08/10/2011  *RADIOLOGY REPORT*  Clinical Data: Cough, shortness  of breath.  History of smoking.  CHEST - 2 VIEW  Comparison: Chest x-ray 06/16/2010.  Findings: Lungs appear hyperexpanded with flattening of the hemidiaphragms, increased retrosternal air space and pruning of the pulmonary vasculature in the periphery, suggestive of underlying COPD.  No consolidative airspace disease.  No pleural effusions. Prominent symmetric nodular opacities project over the mid - lower lungs bilaterally, most consistent with nipple shadows.  No other suspicious appearing pulmonary nodules or masses are otherwise identified.  Cardiomediastinal silhouette is within normal limits.  IMPRESSION: 1.  Changes of COPD redemonstrated, as above, without radiographic evidence of acute cardiopulmonary disease.  Original Report Authenticated By: Florencia Reasons, M.D.     1. Bronchitis   2. Urinary tract infection   3. Tobacco abuse       MDM  Non toxic appearing. Ambulating without difficulty. NAD. Abdomen soft and non tender. Pulse ox 99% on room air. Spoke at length with patient about importance of PCP. She voices understanding.         [place], [place] 08/10/11 1009

## 2013-03-21 ENCOUNTER — Emergency Department (HOSPITAL_COMMUNITY): Payer: Self-pay

## 2013-03-21 ENCOUNTER — Emergency Department (HOSPITAL_COMMUNITY)
Admission: EM | Admit: 2013-03-21 | Discharge: 2013-03-21 | Disposition: A | Discharge status: Home / Self Care | Payer: Self-pay | Attending: Emergency Medicine | Admitting: Emergency Medicine

## 2013-03-21 ENCOUNTER — Encounter (HOSPITAL_COMMUNITY): Payer: Self-pay | Admitting: Emergency Medicine

## 2013-03-21 DIAGNOSIS — Z8781 Personal history of (healed) traumatic fracture: Secondary | ICD-10-CM | POA: Insufficient documentation

## 2013-03-21 DIAGNOSIS — J3489 Other specified disorders of nose and nasal sinuses: Secondary | ICD-10-CM | POA: Insufficient documentation

## 2013-03-21 DIAGNOSIS — R51 Headache: Secondary | ICD-10-CM | POA: Insufficient documentation

## 2013-03-21 DIAGNOSIS — R630 Anorexia: Secondary | ICD-10-CM | POA: Insufficient documentation

## 2013-03-21 DIAGNOSIS — J441 Chronic obstructive pulmonary disease with (acute) exacerbation: Secondary | ICD-10-CM | POA: Insufficient documentation

## 2013-03-21 DIAGNOSIS — F17200 Nicotine dependence, unspecified, uncomplicated: Secondary | ICD-10-CM | POA: Insufficient documentation

## 2013-03-21 DIAGNOSIS — Z79899 Other long term (current) drug therapy: Secondary | ICD-10-CM | POA: Insufficient documentation

## 2013-03-21 DIAGNOSIS — R509 Fever, unspecified: Secondary | ICD-10-CM | POA: Insufficient documentation

## 2013-03-21 IMAGING — CR DG CHEST 2V
2 series · 2 of 2 positions shown · non-contrast
Comparison: [DATE]

CLINICAL DATA: Chest pain and shortness of breath

EXAM:
CHEST  2 VIEW

[w chest pa]
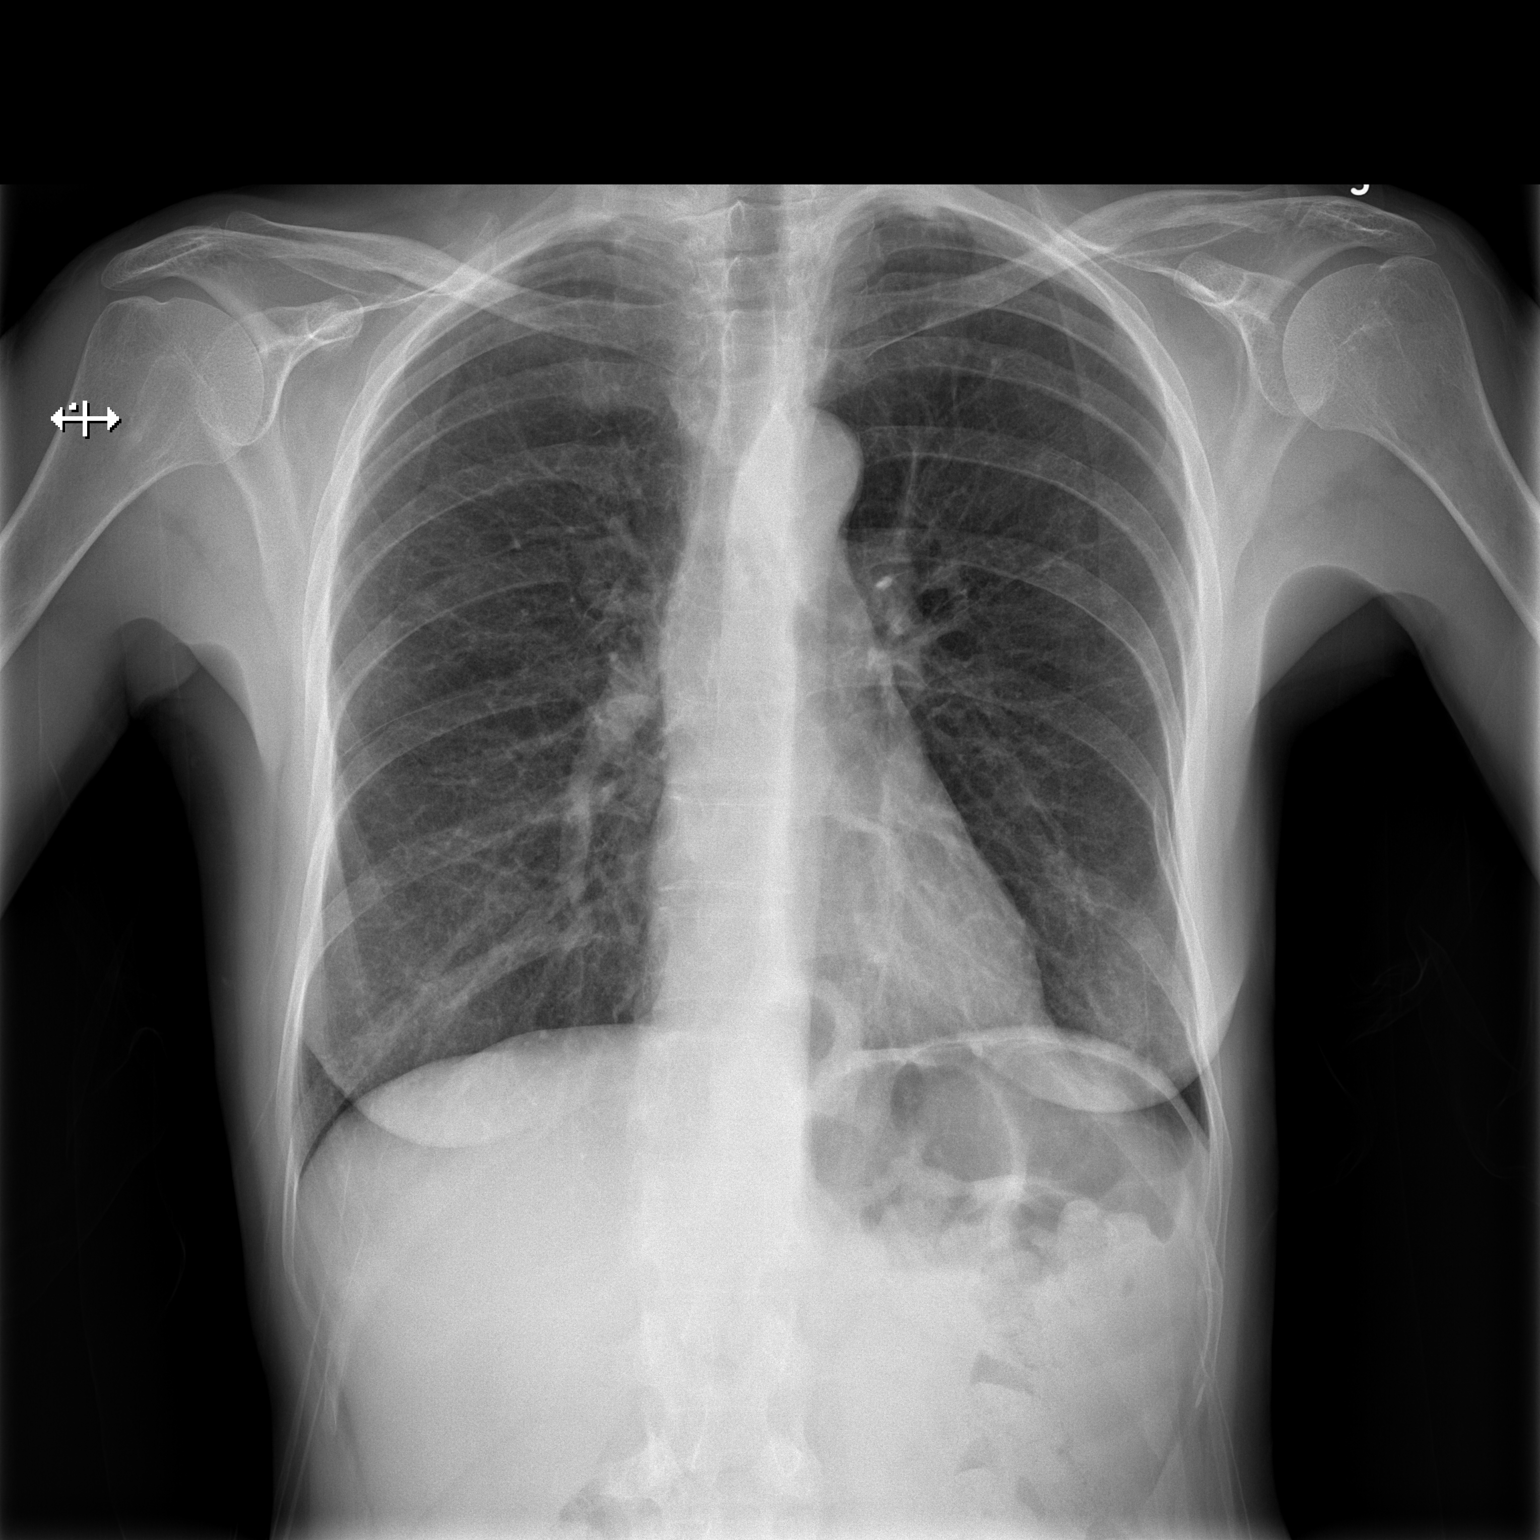

[w chest lat]
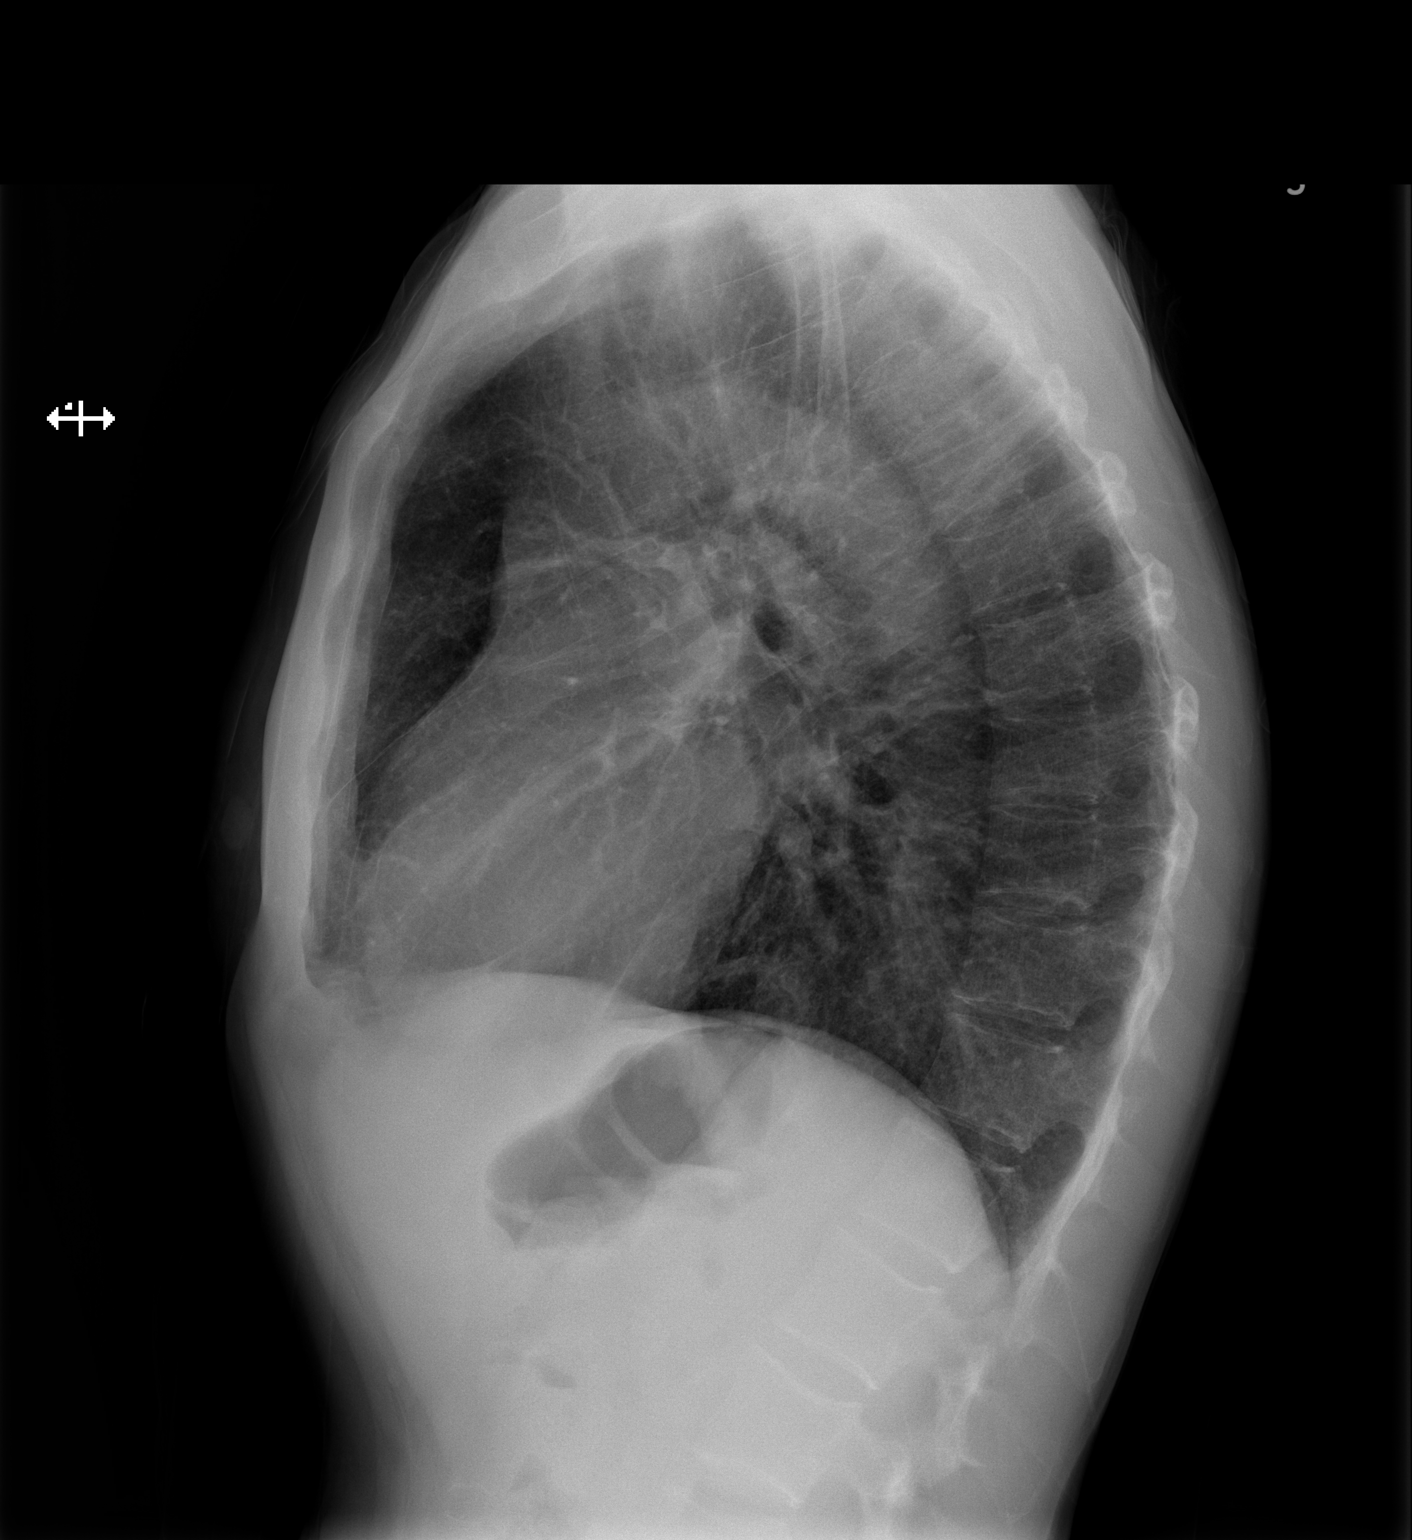

[2 of 2 positions shown; findings below may reference images not displayed]

FINDINGS: The lungs are mildly hyperexpanded, suggesting a degree of
underlying emphysema. There is no edema or consolidation. The heart
size is normal. Pulmonary vascularity suggests underlying emphysema.

There is a small hiatal hernia.

There is evidence of an old rib fracture involving the left lateral
8th rib.
IMPRESSION: There is evidence of a degree of underlying emphysematous change. No
edema or consolidation. Small hiatal hernia.

## 2013-03-21 MED ORDER — IBUPROFEN 400 MG PO TABS
800.0000 mg | ORAL_TABLET | Freq: Once | ORAL | Status: AC
  Administered 2013-03-21: 800 mg via ORAL
  Filled 2013-03-21: qty 2

## 2013-03-21 MED ORDER — AZITHROMYCIN 250 MG PO TABS: ORAL_TABLET | ORAL | Status: DC

## 2013-03-21 MED ORDER — AZITHROMYCIN 250 MG PO TABS
500.0000 mg | ORAL_TABLET | Freq: Once | ORAL | Status: AC
  Administered 2013-03-21: 500 mg via ORAL
  Filled 2013-03-21: qty 2

## 2013-03-21 MED ORDER — PREDNISONE 10 MG PO TABS: 60.0000 mg | ORAL_TABLET | Freq: Every day | ORAL | Status: AC

## 2013-03-21 MED ORDER — ALBUTEROL SULFATE HFA 108 (90 BASE) MCG/ACT IN AERS: 1.0000 | INHALATION_SPRAY | Freq: Four times a day (QID) | RESPIRATORY_TRACT | Status: DC | PRN

## 2013-03-21 MED ORDER — ALBUTEROL SULFATE HFA 108 (90 BASE) MCG/ACT IN AERS
2.0000 | INHALATION_SPRAY | Freq: Once | RESPIRATORY_TRACT | Status: AC
  Administered 2013-03-21: 2 via RESPIRATORY_TRACT
  Filled 2013-03-21: qty 6.7

## 2013-03-21 NOTE — ED Notes (Signed)
Pt has had URI since last Friday.  Pt has had cough (dry) and congestion.  Pt has had fever and chills with this.

## 2013-03-21 NOTE — ED Notes (Signed)
Sore throat, cough (non-productive), headache x 4-5 days. C/o chest pain with cough. No acute distress.

## 2013-03-21 NOTE — ED Provider Notes (Signed)
CSN: [id number]     Arrival date & time 03/21/13  7846 History  This chart was scribed for Isaias Sakai, NP, working with Gavin Pound. Oletta Lamas, MD by Blanchard Kelch, ED Scribe. This patient was seen in room TR05C/TR05C and the patient's care was started at 10:03 AM.    Chief Complaint  Patient presents with  . URI    Patient is a 52 y.o. female presenting with URI. The history is provided by the patient. No language interpreter was used.  URI Presenting symptoms: congestion, cough, fever, rhinorrhea and sore throat   Onset quality:  Gradual Duration:  3 days Timing:  Constant Progression:  Worsening Ineffective treatments: Alkaseltzer. Associated symptoms: headaches     HPI Comments: Monica Miranda is a 51 y.o. female who presents to the Emergency Department complaining of gradual onset, gradually worsening upper respiratory infection that began three days ago. She is complaining of associated dry cough, intermittent shortness of breath, headache, congestion, appetite loss, sore throat and rhinorrhea. She states that she has had an intermittent fever with temperatures ranging from 101-102.7. She used an alka-seltzer tablet this morning without relief. Not tried inhalers. She reports a past medical history of COPD. Denies chest pain,  N/V.   Past Medical History  Diagnosis Date  . Rib fracture     4 months ago   Past Surgical History  Procedure Laterality Date  . Esophageal dilation     No family history on file. History  Substance Use Topics  . Smoking status: Current Every Day Smoker -- 1.00 packs/day  . Smokeless tobacco: Not on file  . Alcohol Use: Yes   OB History   Grav Para Term Preterm Abortions TAB SAB Ect Mult Living                 Review of Systems  Constitutional: Positive for fever and appetite change.  HENT: Positive for congestion, rhinorrhea and sore throat.   Eyes: Negative for discharge.  Respiratory: Positive for cough and shortness of breath.    Gastrointestinal: Negative for vomiting.  Musculoskeletal: Negative for back pain.  Skin: Negative for rash.  Neurological: Positive for headaches.  Hematological: Does not bruise/bleed easily.  Psychiatric/Behavioral: The patient is not nervous/anxious.     Allergies  Codeine  Home Medications   Current Outpatient Rx  Name  Route  Sig  Dispense  Refill  . cyanocobalamin 500 MCG tablet   Oral   Take 500 mcg by mouth daily.         . Multiple Vitamins-Minerals (MULTIVITAMINS THER. W/MINERALS) TABS   Oral   Take 1 tablet by mouth daily.            Triage Vitals: BP 139/87  Pulse 90  Temp(Src) 98.4 F (36.9 C) (Oral)  SpO2 97%  Physical Exam  Nursing note and vitals reviewed. Constitutional: She is oriented to person, place, and time. She appears well-developed and well-nourished.  HENT:  Head: Normocephalic and atraumatic.  Right Ear: External ear normal.  Left Ear: External ear normal.  Nose: Mucosal edema and rhinorrhea present.  Mouth/Throat: Mucous membranes are normal. Posterior oropharyngeal erythema present. No oropharyngeal exudate.  Eyes: Conjunctivae and EOM are normal. Pupils are equal, round, and reactive to light.  Neck: Normal range of motion. Neck supple.  Cardiovascular: Normal rate, regular rhythm and normal heart sounds.   Pulmonary/Chest: Effort normal. No respiratory distress. She has wheezes ( occasional expiratory). She has no rales.  Abdominal: Soft. Bowel sounds are normal.  Musculoskeletal: Normal range of motion.  Lymphadenopathy:    She has no cervical adenopathy.  Neurological: She is alert and oriented to person, place, and time.  Skin: Skin is warm and dry.  Psychiatric: She has a normal mood and affect.    ED Course  Procedures (including critical care time)  DIAGNOSTIC STUDIES: Oxygen Saturation is 97% on room air, adequate by my interpretation.    COORDINATION OF CARE: 10:10 AM -Will order chest x-ray. Patient verbalizes  understanding and agrees with treatment plan.  52 yo female with hx of COPD presents with cough, fever, dyspnea. Afebrile and nontoxic here with stable VS. O2 sat 97% on RA indicating excellent oxygenation. CXR reveals no pneumonia, but does reveal emphysematous changes. Clinical picture supportive of COPD exacerbation secondary to viral URI. Will treat with short course of steroids, albuterol MDI, and zithromax and recommend close f/u with PCP. Strict return instructions discussed and provided to the patient in writing at time of d/c.   Labs Review Labs Reviewed - No data to display Imaging Review Dg Chest 2 View  03/21/2013   CLINICAL DATA:  Chest pain and shortness of breath  EXAM: CHEST  2 VIEW  COMPARISON:  August 10, 2011  FINDINGS: The lungs are mildly hyperexpanded, suggesting a degree of underlying emphysema. There is no edema or consolidation. The heart size is normal. Pulmonary vascularity suggests underlying emphysema.  There is a small hiatal hernia.  There is evidence of an old rib fracture involving the left lateral 8th rib.  IMPRESSION: There is evidence of a degree of underlying emphysematous change. No edema or consolidation. Small hiatal hernia.   Electronically Signed   By: Bretta Bang M.D.   On: 03/21/2013 10:27    EKG Interpretation   None       MDM   1. COPD exacerbation      I personally performed the services described in this documentation, which was scribed in my presence. The recorded information has been reviewed and is accurate.     Simmie Davies, NP 03/21/13 [phone]

## 2013-03-21 NOTE — Discharge Planning (Signed)
[name]- [employer]   Patient is to follow up with the [facility] 04/03/13 at 10:15am to establish primary care, patient will also be obtaining the orange card. Patient is aware of appointment and was given the application and my contact information for any questions or concerns

## 2013-03-26 NOTE — ED Provider Notes (Signed)
Medical screening examination/treatment/procedure(s) were performed by non-physician practitioner and as supervising physician I was immediately available for consultation/collaboration.  Anetha Slagel Y. Fredderick Swanger, MD 03/26/13 0130 

## 2013-04-03 ENCOUNTER — Ambulatory Visit: Payer: Self-pay

## 2013-04-03 ENCOUNTER — Inpatient Hospital Stay: Payer: Self-pay

## 2013-09-16 ENCOUNTER — Emergency Department (HOSPITAL_COMMUNITY): Payer: Self-pay

## 2013-09-16 ENCOUNTER — Emergency Department (HOSPITAL_COMMUNITY)
Admission: EM | Admit: 2013-09-16 | Discharge: 2013-09-16 | Disposition: A | Discharge status: Home / Self Care | Payer: Self-pay | Attending: Emergency Medicine | Admitting: Emergency Medicine

## 2013-09-16 ENCOUNTER — Encounter (HOSPITAL_COMMUNITY): Payer: Self-pay | Admitting: Emergency Medicine

## 2013-09-16 DIAGNOSIS — Z79899 Other long term (current) drug therapy: Secondary | ICD-10-CM | POA: Insufficient documentation

## 2013-09-16 DIAGNOSIS — F17200 Nicotine dependence, unspecified, uncomplicated: Secondary | ICD-10-CM | POA: Insufficient documentation

## 2013-09-16 DIAGNOSIS — Z8781 Personal history of (healed) traumatic fracture: Secondary | ICD-10-CM | POA: Insufficient documentation

## 2013-09-16 DIAGNOSIS — R071 Chest pain on breathing: Secondary | ICD-10-CM | POA: Insufficient documentation

## 2013-09-16 DIAGNOSIS — J209 Acute bronchitis, unspecified: Secondary | ICD-10-CM

## 2013-09-16 DIAGNOSIS — R0789 Other chest pain: Secondary | ICD-10-CM

## 2013-09-16 DIAGNOSIS — J441 Chronic obstructive pulmonary disease with (acute) exacerbation: Secondary | ICD-10-CM | POA: Insufficient documentation

## 2013-09-16 DIAGNOSIS — R509 Fever, unspecified: Secondary | ICD-10-CM | POA: Insufficient documentation

## 2013-09-16 HISTORY — DX: Chronic obstructive pulmonary disease, unspecified: J44.9

## 2013-09-16 LAB — CBC
HEMATOCRIT: 36.3 % (ref 36.0–46.0)
Hemoglobin: 10.7 g/dL — ABNORMAL LOW (ref 12.0–15.0)
MCH: 20.1 pg — ABNORMAL LOW (ref 26.0–34.0)
MCHC: 29.5 g/dL — ABNORMAL LOW (ref 30.0–36.0)
MCV: 68.2 fL — ABNORMAL LOW (ref 78.0–100.0)
PLATELETS: 355 10*3/uL (ref 150–400)
RBC: 5.32 MIL/uL — AB (ref 3.87–5.11)
RDW: 20 % — AB (ref 11.5–15.5)
WBC: 7.1 10*3/uL (ref 4.0–10.5)

## 2013-09-16 LAB — BASIC METABOLIC PANEL
BUN: 11 mg/dL (ref 6–23)
CHLORIDE: 102 meq/L (ref 96–112)
CO2: 23 meq/L (ref 19–32)
Calcium: 9.3 mg/dL (ref 8.4–10.5)
Creatinine, Ser: 0.59 mg/dL (ref 0.50–1.10)
GFR calc non Af Amer: >90 mL/min (ref >90)
Glucose, Bld: 92 mg/dL (ref 70–99)
POTASSIUM: 4.3 meq/L (ref 3.7–5.3)
Sodium: 141 mEq/L (ref 137–147)

## 2013-09-16 LAB — I-STAT TROPONIN, ED: TROPONIN I, POC: 0 ng/mL (ref 0.00–0.08)

## 2013-09-16 IMAGING — CR DG CHEST 2V
2 series · 2 of 2 positions shown · non-contrast
Comparison: DG CHEST 2 VIEW dated [DATE]

CLINICAL DATA: Chest pain and shortness of breath.

EXAM:
CHEST - 2 VIEW

[w chest pa]
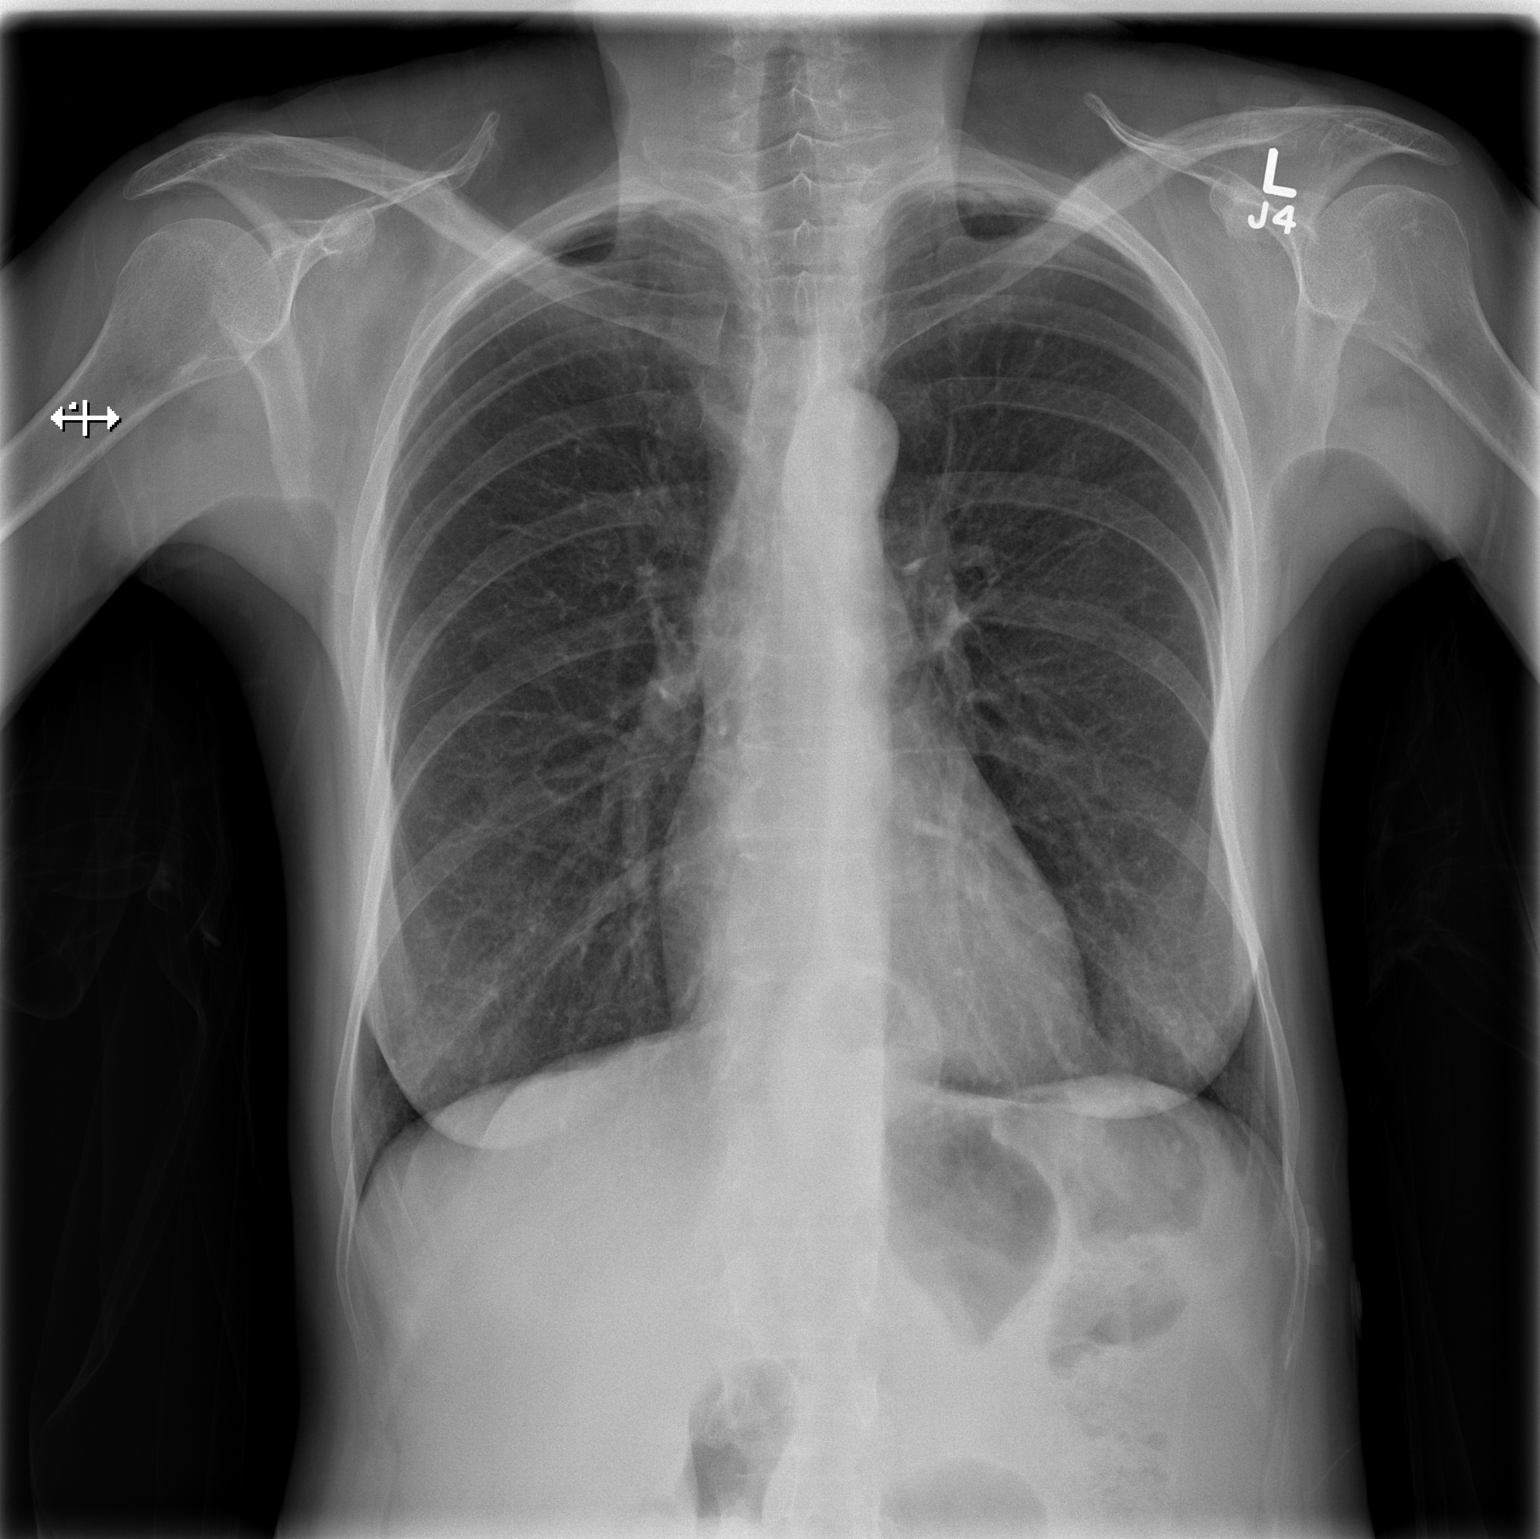

[w chest lat]
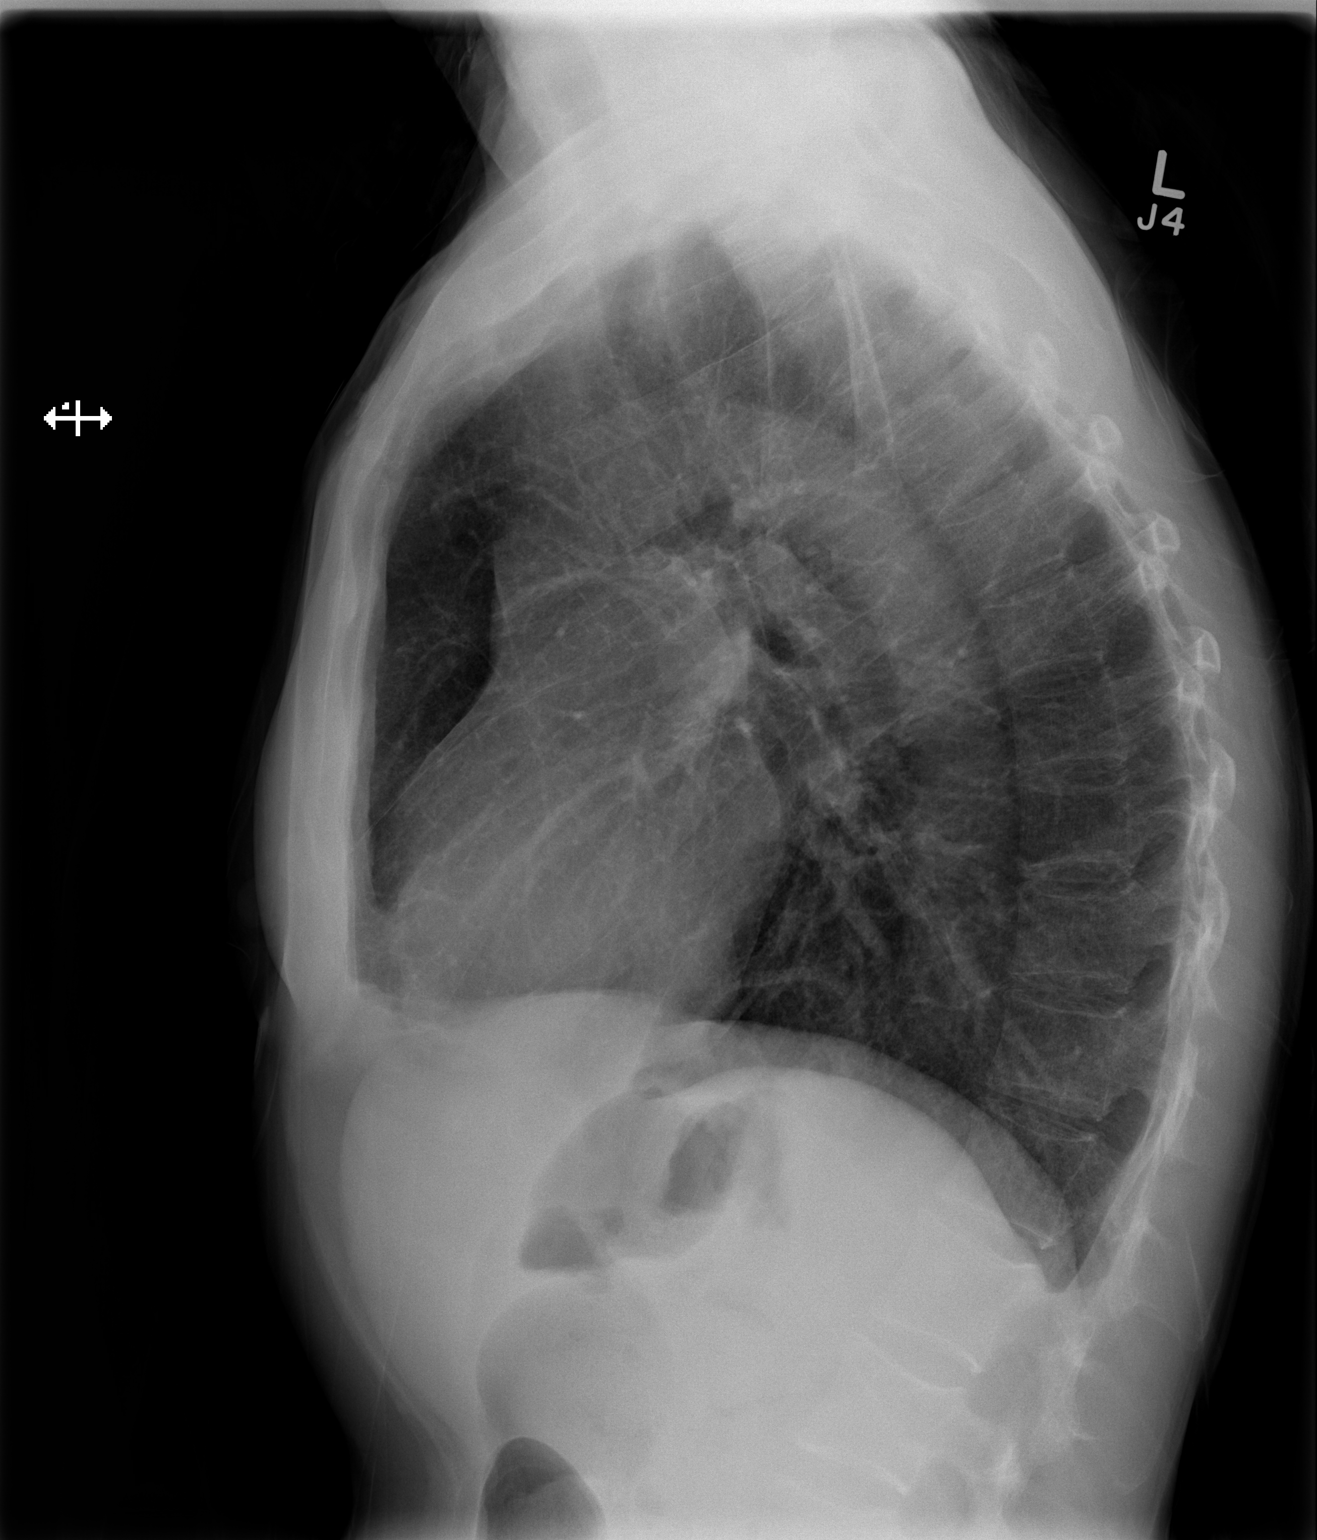

[2 of 2 positions shown; findings below may reference images not displayed]

FINDINGS: The heart size and mediastinal contours are within normal limits.
Stable COPD/chronic lung disease. There is no evidence of pulmonary
edema, consolidation, pneumothorax, nodule or pleural fluid. Bony
structures show stable osteopenia without evidence of fracture.
IMPRESSION: Stable COPD.  No acute findings.

## 2013-09-16 MED ORDER — OXYCODONE-ACETAMINOPHEN 5-325 MG PO TABS
2.0000 | ORAL_TABLET | Freq: Once | ORAL | Status: AC
  Administered 2013-09-16: 2 via ORAL
  Filled 2013-09-16: qty 2

## 2013-09-16 MED ORDER — PREDNISONE 20 MG PO TABS
60.0000 mg | ORAL_TABLET | Freq: Once | ORAL | Status: AC
  Administered 2013-09-16: 60 mg via ORAL
  Filled 2013-09-16: qty 3

## 2013-09-16 MED ORDER — ALBUTEROL SULFATE HFA 108 (90 BASE) MCG/ACT IN AERS
4.0000 | INHALATION_SPRAY | Freq: Once | RESPIRATORY_TRACT | Status: AC
  Administered 2013-09-16: 4 via RESPIRATORY_TRACT
  Filled 2013-09-16: qty 6.7

## 2013-09-16 MED ORDER — DOXYCYCLINE HYCLATE 100 MG PO CAPS: 100.0000 mg | ORAL_CAPSULE | Freq: Two times a day (BID) | ORAL | Status: DC

## 2013-09-16 MED ORDER — OXYCODONE-ACETAMINOPHEN 5-325 MG PO TABS: 1.0000 | ORAL_TABLET | Freq: Four times a day (QID) | ORAL | Status: DC | PRN

## 2013-09-16 MED ORDER — ALBUTEROL SULFATE HFA 108 (90 BASE) MCG/ACT IN AERS: 1.0000 | INHALATION_SPRAY | Freq: Four times a day (QID) | RESPIRATORY_TRACT | Status: DC | PRN

## 2013-09-16 NOTE — ED Notes (Signed)
Discharge inst and prescriptions given   Voiced understanding.

## 2013-09-16 NOTE — Discharge Instructions (Signed)
Acute Bronchitis Bronchitis is inflammation of the airways that extend from the windpipe into the lungs (bronchi). The inflammation often causes mucus to develop. This leads to a cough, which is the most common symptom of bronchitis.  In acute bronchitis, the condition usually develops suddenly and goes away over time, usually in a couple weeks. Smoking, allergies, and asthma can make bronchitis worse. Repeated episodes of bronchitis may cause further lung problems.  CAUSES Acute bronchitis is most often caused by the same virus that causes a cold. The virus can spread from person to person (contagious).  SIGNS AND SYMPTOMS   Cough.   Fever.   Coughing up mucus.   Body aches.   Chest congestion.   Chills.   Shortness of breath.   Sore throat.  DIAGNOSIS  Acute bronchitis is usually diagnosed through a physical exam. Tests, such as chest X-rays, are sometimes done to rule out other conditions.  TREATMENT  Acute bronchitis usually goes away in a couple weeks. Often times, no medical treatment is necessary. Medicines are sometimes given for relief of fever or cough. Antibiotics are usually not needed but may be prescribed in certain situations. In some cases, an inhaler may be recommended to help reduce shortness of breath and control the cough. A cool mist vaporizer may also be used to help thin bronchial secretions and make it easier to clear the chest.  HOME CARE INSTRUCTIONS  Get plenty of rest.   Drink enough fluids to keep your urine clear or pale yellow (unless you have a medical condition that requires fluid restriction). Increasing fluids may help thin your secretions and will prevent dehydration.   Only take over-the-counter or prescription medicines as directed by your health care provider.   Avoid smoking and secondhand smoke. Exposure to cigarette smoke or irritating chemicals will make bronchitis worse. If you are a smoker, consider using nicotine gum or skin  patches to help control withdrawal symptoms. Quitting smoking will help your lungs heal faster.   Reduce the chances of another bout of acute bronchitis by washing your hands frequently, avoiding people with cold symptoms, and trying not to touch your hands to your mouth, nose, or eyes.   Follow up with your health care provider as directed.  SEEK MEDICAL CARE IF: Your symptoms do not improve after 1 week of treatment.  SEEK IMMEDIATE MEDICAL CARE IF:  You develop an increased fever or chills.   You have chest pain.   You have severe shortness of breath.  You have bloody sputum.   You develop dehydration.  You develop fainting.  You develop repeated vomiting.  You develop a severe headache. MAKE SURE YOU:   Understand these instructions.  Will watch your condition.  Will get help right away if you are not doing well or get worse. Document Released: 06/30/2004 Document Revised: 01/23/2013 Document Reviewed: 11/13/2012 Exit[employer] Patient Information 2014 [employer].  

## 2013-09-16 NOTE — ED Provider Notes (Signed)
CSN: [id number]     Arrival date & time 09/16/13  1512 History   First MD Initiated Contact with Patient 09/16/13 1717     Chief Complaint  Patient presents with  . Chest Pain     (Consider location/radiation/quality/duration/timing/severity/associated sxs/prior Treatment) Patient is a 52 y.o. female presenting with chest pain. The history is provided by the patient. No language interpreter was used.  Chest Pain Pain location:  L lateral chest Pain quality: sharp   Pain radiates to:  Does not radiate Pain radiates to the back: no   Pain severity:  Severe Onset quality:  Sudden Duration: yesterday. Timing:  Intermittent Progression:  Waxing and waning Chronicity:  New Context: at rest   Relieved by:  Rest Worsened by:  Coughing, certain positions and deep breathing Ineffective treatments:  None tried Associated symptoms: cough, fever and shortness of breath   Associated symptoms: no abdominal pain, no back pain, no diaphoresis, no fatigue, no headache, no nausea, no numbness, no palpitations, not vomiting and no weakness   Cough:    Cough characteristics:  Productive   Sputum characteristics:  Green   Severity:  Moderate   Onset quality:  Gradual   Duration:  3 days   Timing:  Constant   Progression:  Worsening Fever:    Temp source:  Subjective Shortness of breath:    Severity:  Moderate   Onset quality:  Gradual   Duration:  3 days   Timing:  Intermittent   Progression:  Waxing and waning Risk factors: smoking   Risk factors: no birth control, no coronary artery disease, no diabetes mellitus, no high cholesterol, no hypertension, no immobilization, not obese, not pregnant, no prior DVT/PE and no surgery   Risk factors comment:  COPD   Past Medical History  Diagnosis Date  . Rib fracture     4 months ago  . COPD (chronic obstructive pulmonary disease)    Past Surgical History  Procedure Laterality Date  . Esophageal dilation     No family history on  file. History  Substance Use Topics  . Smoking status: Current Every Day Smoker -- 1.00 packs/day  . Smokeless tobacco: Not on file  . Alcohol Use: Yes     Comment: occ   OB History   Grav Para Term Preterm Abortions TAB SAB Ect Mult Living                 Review of Systems  Constitutional: Positive for fever. Negative for chills, diaphoresis, activity change, appetite change and fatigue.  HENT: Negative for congestion, facial swelling, rhinorrhea and sore throat.   Eyes: Negative for photophobia and discharge.  Respiratory: Positive for cough and shortness of breath. Negative for chest tightness.   Cardiovascular: Positive for chest pain. Negative for palpitations and leg swelling.  Gastrointestinal: Negative for nausea, vomiting, abdominal pain and diarrhea.  Endocrine: Negative for polydipsia and polyuria.  Genitourinary: Negative for dysuria, frequency, difficulty urinating and pelvic pain.  Musculoskeletal: Negative for arthralgias, back pain, neck pain and neck stiffness.  Skin: Negative for color change and wound.  Allergic/Immunologic: Negative for immunocompromised state.  Neurological: Negative for facial asymmetry, weakness, numbness and headaches.  Hematological: Does not bruise/bleed easily.  Psychiatric/Behavioral: Negative for confusion and agitation.      Allergies  Codeine  Home Medications   Current Outpatient Rx  Name  Route  Sig  Dispense  Refill  . albuterol (PROVENTIL HFA;VENTOLIN HFA) 108 (90 BASE) MCG/ACT inhaler   Inhalation   Inhale  1-2 puffs into the lungs every 6 (six) hours as needed for wheezing.   1 Inhaler   0   . cyanocobalamin 500 MCG tablet   Oral   Take 500 mcg by mouth daily.         . Multiple Vitamins-Minerals (MULTIVITAMINS THER. W/MINERALS) TABS   Oral   Take 1 tablet by mouth daily.           Marland Kitchen. albuterol (PROVENTIL HFA;VENTOLIN HFA) 108 (90 BASE) MCG/ACT inhaler   Inhalation   Inhale 1-2 puffs into the lungs every 6  (six) hours as needed for wheezing or shortness of breath.   1 Inhaler   0   . doxycycline (VIBRAMYCIN) 100 MG capsule   Oral   Take 1 capsule (100 mg total) by mouth 2 (two) times daily. One po bid x 7 days   14 capsule   0   . oxyCODONE-acetaminophen (PERCOCET) 5-325 MG per tablet   Oral   Take 1 tablet by mouth every 6 (six) hours as needed.   15 tablet   0    BP 133/79  Pulse 68  Temp(Src) 98.2 F (36.8 C) (Oral)  Resp 16  Ht 5\' 1"  (1.549 m)  Wt 110 lb (49.896 kg)  BMI 20.80 kg/m2  SpO2 98% Physical Exam  Constitutional: She is oriented to person, place, and time. She appears well-developed and well-nourished. No distress.  HENT:  Head: Normocephalic and atraumatic.  Mouth/Throat: No oropharyngeal exudate.  Eyes: Pupils are equal, round, and reactive to light.  Neck: Normal range of motion. Neck supple.  Cardiovascular: Normal rate, regular rhythm and normal heart sounds.  Exam reveals no gallop and no friction rub.   No murmur heard. Pulmonary/Chest: Effort normal and breath sounds normal. No respiratory distress. She has no wheezes. She has no rales.  Abdominal: Soft. Bowel sounds are normal. She exhibits no distension and no mass. There is no tenderness. There is no rebound and no guarding.  Musculoskeletal: Normal range of motion. She exhibits no edema and no tenderness.  Neurological: She is alert and oriented to person, place, and time.  Skin: Skin is warm and dry.  Psychiatric: She has a normal mood and affect.    ED Course  Procedures (including critical care time) Labs Review Labs Reviewed  CBC - Abnormal; Notable for the following:    RBC 5.32 (*)    Hemoglobin 10.7 (*)    MCV 68.2 (*)    MCH 20.1 (*)    MCHC 29.5 (*)    RDW 20.0 (*)    All other components within normal limits  BASIC METABOLIC PANEL  I-STAT TROPOININ, ED   Imaging Review Dg Chest 2 View  09/16/2013   CLINICAL DATA:  Chest pain and shortness of breath.  EXAM: CHEST - 2 VIEW   COMPARISON:  DG CHEST 2 VIEW dated 03/21/2013  FINDINGS: The heart size and mediastinal contours are within normal limits. Stable COPD/chronic lung disease. There is no evidence of pulmonary edema, consolidation, pneumothorax, nodule or pleural fluid. Bony structures show stable osteopenia without evidence of fracture.  IMPRESSION: Stable COPD.  No acute findings.   Electronically Signed   By: Irish LackGlenn  Yamagata M.D.   On: 09/16/2013 16:48     EKG Interpretation   Date/Time:  Monday September 16 2013 15:23:06 EDT Ventricular Rate:  81 PR Interval:  160 QRS Duration: 76 QT Interval:  396 QTC Calculation: 460 R Axis:   63 Text Interpretation:  Normal sinus rhythm Cannot  rule out Anterior infarct  , age undetermined Abnormal ECG No significant change since last tracing  Confirmed by Talisa Petrak  MD, Aerilyn Slee [phone]([id number]) on 09/16/2013 6:07:49 PM      MDM   Final diagnoses:  Acute bronchitis  Chest wall pain    Pt is a 53 y.o. female with Pmhx as above who presents with about 3 days of increased productive cough, with green sputum, congestion, now with L lateral chest pain since last night, worse w/ cough, palpation & deep breathing. +subjective fever/chills. No PE, VSS, pt in NAD. No pain at rest, though is very tender to palpation of L lateral chest wall. Breath sounds diminished, but in resp distress, no wheezing. No LE pain/edema. Low risk for PE using Well's criteria. EKG w/o acute ischemic changes, trop not elevated which is reassuring given CP started yesterday. CXR w/o acute changes. Suspect acute bronchitis and MSK/cosochondritis.  Given hx of COPD, will start on PO doxycycline, 5D steroid burst, and PRN albuterol. Will d/c home w/ Return precautions given for new or worsening symptoms including worsening pain, SOB, fevers. Will request she est w/ local PCP.          Shanna CiscoMegan E Romani Wilbon, MD 09/17/13 1045

## 2013-09-16 NOTE — ED Notes (Signed)
Pt describes breathing as like breathing in humid weather on a hot day

## 2013-09-16 NOTE — ED Notes (Signed)
Pt reports congestion since last Thursday and states last nite started having hard time breathing and left under breast area chest pain.  Pt reports green sputum and has copd.

## 2014-08-21 ENCOUNTER — Emergency Department: Payer: Self-pay | Admitting: Emergency Medicine

## 2016-02-01 ENCOUNTER — Encounter (HOSPITAL_COMMUNITY): Payer: Self-pay

## 2016-02-01 ENCOUNTER — Emergency Department (HOSPITAL_COMMUNITY)
Admission: EM | Admit: 2016-02-01 | Discharge: 2016-02-01 | Disposition: A | Discharge status: Home / Self Care | Payer: Self-pay | Attending: Emergency Medicine | Admitting: Emergency Medicine

## 2016-02-01 DIAGNOSIS — F17200 Nicotine dependence, unspecified, uncomplicated: Secondary | ICD-10-CM | POA: Insufficient documentation

## 2016-02-01 DIAGNOSIS — Z79899 Other long term (current) drug therapy: Secondary | ICD-10-CM | POA: Insufficient documentation

## 2016-02-01 DIAGNOSIS — J449 Chronic obstructive pulmonary disease, unspecified: Secondary | ICD-10-CM | POA: Insufficient documentation

## 2016-02-01 DIAGNOSIS — K047 Periapical abscess without sinus: Secondary | ICD-10-CM | POA: Insufficient documentation

## 2016-02-01 MED ORDER — ONDANSETRON HCL 4 MG PO TABS: 4.0000 mg | ORAL_TABLET | Freq: Three times a day (TID) | ORAL | 0 refills | Status: DC | PRN

## 2016-02-01 MED ORDER — AMOXICILLIN 500 MG PO CAPS: 500.0000 mg | ORAL_CAPSULE | Freq: Three times a day (TID) | ORAL | 0 refills | Status: DC

## 2016-02-01 MED ORDER — ONDANSETRON 4 MG PO TBDP
4.0000 mg | ORAL_TABLET | Freq: Once | ORAL | Status: AC
  Administered 2016-02-01: 4 mg via ORAL
  Filled 2016-02-01: qty 1

## 2016-02-01 MED ORDER — OXYCODONE-ACETAMINOPHEN 5-325 MG PO TABS: 1.0000 | ORAL_TABLET | ORAL | 0 refills | Status: DC | PRN

## 2016-02-01 MED ORDER — OXYCODONE-ACETAMINOPHEN 5-325 MG PO TABS
2.0000 | ORAL_TABLET | Freq: Once | ORAL | Status: AC
  Administered 2016-02-01: 2 via ORAL
  Filled 2016-02-01: qty 2

## 2016-02-01 MED ORDER — AMOXICILLIN 500 MG PO CAPS
1000.0000 mg | ORAL_CAPSULE | Freq: Once | ORAL | Status: AC
  Administered 2016-02-01: 1000 mg via ORAL
  Filled 2016-02-01: qty 2

## 2016-02-01 NOTE — ED Notes (Signed)
Patient being picked up by family for transport to home.

## 2016-02-01 NOTE — ED Provider Notes (Signed)
[facility]-EMERGENCY DEPT Provider Note   CSN: [id number] Arrival date & time: 02/01/16  1906  By signing my name below, I, Monica Miranda, attest that this documentation has been prepared under the direction and in the presence of non-physician practitioner, Arthor CaptainAbigail Tavarion Babington, PA-C. Electronically Signed: Nelwyn SalisburyJoshua Miranda, Scribe. 02/01/2016. 9:10 PM.   History   Chief Complaint Chief Complaint  Patient presents with  . Dental Pain    The history is provided by the patient. No language interpreter was used.     HPI Comments:  Monica Miranda is a 55 y.o. female with PMHx of COPD who presents to the Emergency Department complaining of worsening constant left sided dental pain occuring last night. Pt states that she broke her tooth a month ago but the pain worsened last night. She reports that he pain is worsened by palpation. No alleviating factors indicated.  She reports associated loss of appetite. She denies and fever or chills.    First left premolar.  Past Medical History:  Diagnosis Date  . COPD (chronic obstructive pulmonary disease) (HCC)   . Rib fracture    4 months ago    There are no active problems to display for this patient.   Past Surgical History:  Procedure Laterality Date  . ESOPHAGEAL DILATION      OB History    No data available       Home Medications    Prior to Admission medications   Medication Sig Start Date End Date Taking? Authorizing Provider  albuterol (PROVENTIL HFA;VENTOLIN HFA) 108 (90 BASE) MCG/ACT inhaler Inhale 1-2 puffs into the lungs every 6 (six) hours as needed for wheezing. 03/21/13   Isaias SakaiAngela Muller, NP  albuterol (PROVENTIL HFA;VENTOLIN HFA) 108 (90 BASE) MCG/ACT inhaler Inhale 1-2 puffs into the lungs every 6 (six) hours as needed for wheezing or shortness of breath. 09/16/13   Toy CookeyMegan Docherty, MD  cyanocobalamin 500 MCG tablet Take 500 mcg by mouth daily.    Historical Provider, MD  doxycycline (VIBRAMYCIN) 100 MG capsule Take 1 capsule  (100 mg total) by mouth 2 (two) times daily. One po bid x 7 days 09/16/13   Toy CookeyMegan Docherty, MD  Multiple Vitamins-Minerals (MULTIVITAMINS THER. W/MINERALS) TABS Take 1 tablet by mouth daily.      Historical Provider, MD  oxyCODONE-acetaminophen (PERCOCET) 5-325 MG per tablet Take 1 tablet by mouth every 6 (six) hours as needed. 09/16/13   Toy CookeyMegan Docherty, MD    Family History History reviewed. No pertinent family history.  Social History Social History  Substance Use Topics  . Smoking status: Current Every Day Smoker    Packs/day: 1.00  . Smokeless tobacco: Never Used  . Alcohol use Yes     Comment: occ     Allergies   Codeine   Review of Systems Review of Systems  Constitutional: Positive for appetite change. Negative for chills and fever.  HENT: Positive for dental problem.      Physical Exam Updated Vital Signs BP 142/93 (BP Location: Right Arm)   Pulse 87   Temp 98.3 F (36.8 C) (Oral)   Resp 16   Ht 5\' 1"  (1.549 m)   Wt 106 lb 9 oz (48.3 kg)   SpO2 97%   BMI 20.13 kg/m   Physical Exam  Constitutional: She is oriented to person, place, and time. She appears well-developed and well-nourished. No distress.  HENT:  Head: Normocephalic and atraumatic.  Left first premolar fractured with swelling over left mandible and submental region, no changes in  voice or stridor. No difficulty swallowing. Exquisitely tender to palpation along gumline with induration.   Eyes: EOM are normal.  Neck: Normal range of motion.  Cardiovascular: Normal rate, regular rhythm and normal heart sounds.   Pulmonary/Chest: Effort normal and breath sounds normal.  Abdominal: Soft. She exhibits no distension. There is no tenderness.  Musculoskeletal: Normal range of motion.  Neurological: She is alert and oriented to person, place, and time.  Skin: Skin is warm and dry.  Psychiatric: She has a normal mood and affect. Judgment normal.  Nursing note and vitals reviewed.    ED Treatments /  Results  DIAGNOSTIC STUDIES:    COORDINATION OF CARE:  9:05 PM Discussed treatment plan with pt at bedside and pt agreed to plan.  Labs (all labs ordered are listed, but only abnormal results are displayed) Labs Reviewed - No data to display  EKG  EKG Interpretation None       Radiology No results found.  Procedures Procedures (including critical care time)  Medications Ordered in ED Medications - No data to display  Patient with dentalgia.  No abscess requiring immediate incision and drainage.  Exam not concerning for Ludwig's angina or pharyngeal abscess.  Will treat with Amoxicillin. Pt instructed to follow-up with dentist.  Discussed return precautions. Pt safe for discharge.  Initial Impression / Assessment and Plan / ED Course  I have reviewed the triage vital signs and the nursing notes.  Pertinent labs & imaging results that were available during my care of the patient were reviewed by me and considered in my medical decision making (see chart for details).  Clinical Course    Patient with dental abscess.  Exam unconcerning for Ludwig's angina or spread of infection.  Will treat with penicillin and pain medicine.  Urged patient to follow-up with dentist.     Final Clinical Impressions(s) / ED Diagnoses   Final diagnoses:  Dental abscess    New Prescriptions New Prescriptions   No medications on file  I personally performed the services described in this documentation, which was scribed in my presence. The recorded information has been reviewed and is accurate.      Arthor Captain, PA-C 02/01/16 2121    Marily Memos, MD 02/04/16 [phone]

## 2016-02-01 NOTE — ED Triage Notes (Signed)
Pt complaining of R lower tooth pain since yesterday. Pt states unable to see dentist.

## 2016-07-12 ENCOUNTER — Emergency Department (HOSPITAL_COMMUNITY)
Admission: EM | Admit: 2016-07-12 | Discharge: 2016-07-12 | Disposition: A | Discharge status: Home / Self Care | Payer: Self-pay | Attending: Emergency Medicine | Admitting: Emergency Medicine

## 2016-07-12 ENCOUNTER — Encounter (HOSPITAL_COMMUNITY): Payer: Self-pay | Admitting: Emergency Medicine

## 2016-07-12 DIAGNOSIS — H9202 Otalgia, left ear: Secondary | ICD-10-CM

## 2016-07-12 DIAGNOSIS — H9222 Otorrhagia, left ear: Secondary | ICD-10-CM | POA: Insufficient documentation

## 2016-07-12 DIAGNOSIS — F17200 Nicotine dependence, unspecified, uncomplicated: Secondary | ICD-10-CM | POA: Insufficient documentation

## 2016-07-12 DIAGNOSIS — J449 Chronic obstructive pulmonary disease, unspecified: Secondary | ICD-10-CM | POA: Insufficient documentation

## 2016-07-12 MED ORDER — AMOXICILLIN 500 MG PO CAPS: 500.0000 mg | ORAL_CAPSULE | Freq: Three times a day (TID) | ORAL | 0 refills | Status: DC

## 2016-07-12 MED ORDER — CIPROFLOXACIN-DEXAMETHASONE 0.3-0.1 % OT SUSP
4.0000 [drp] | Freq: Two times a day (BID) | OTIC | Status: AC
Start: 2016-07-12 — End: 2016-07-12
  Administered 2016-07-12: 4 [drp] via OTIC
  Filled 2016-07-12: qty 7.5

## 2016-07-12 MED ORDER — TRAMADOL HCL 50 MG PO TABS: 50.0000 mg | ORAL_TABLET | Freq: Four times a day (QID) | ORAL | 0 refills | Status: DC | PRN

## 2016-07-12 NOTE — ED Notes (Signed)
Pt c/o chest pain 9/10 and states this is chronic for her with her COPD.

## 2016-07-12 NOTE — ED Triage Notes (Signed)
Pt sts bloody drainage from left ear starting yesterday; pt sts some pain and pressure in left eye with blurred vision

## 2016-07-12 NOTE — ED Notes (Signed)
Pt states she understands instructions. Home stable with family with steady gait.  

## 2016-07-12 NOTE — Discharge Instructions (Signed)
Put 4 drops of the ciprodex suspension in the left ear 2 times a day for 10 days. Take the amoxicllin for the same amount of time.  Use the pain medication for pain. You may also use tylenol. Do not drink alcohol with this medication.

## 2016-07-12 NOTE — ED Provider Notes (Signed)
[facility]-EMERGENCY DEPT Provider Note   CSN: [id number] Arrival date & time: 07/12/16  1226  By signing my name below, I, Teofilo Pod, attest that this documentation has been prepared under the direction and in the presence of Arthor Captain, PA-C. Electronically Signed: Teofilo Pod, ED Scribe. 07/12/2016. 3:28 PM.    History   Chief Complaint Chief Complaint  Patient presents with  . Otalgia    The history is provided by the patient. No language interpreter was used.   HPI Comments:  Monica Miranda is a 56 y.o. female with PMHx of COPD who presents to the Emergency Department complaining of constant left ear pain since yesterday. Pt complains of associated bleeding from the left ear, pain and pressure to her left eye, blurred vision. Pt denies trying to irrigate her ear herself. No alleviating factors noted. Pt denies other associated symptoms.   Past Medical History:  Diagnosis Date  . COPD (chronic obstructive pulmonary disease) (HCC)   . Rib fracture    4 months ago    There are no active problems to display for this patient.   Past Surgical History:  Procedure Laterality Date  . ESOPHAGEAL DILATION      OB History    No data available       Home Medications    Prior to Admission medications   Medication Sig Start Date End Date Taking? Authorizing Provider  albuterol (PROVENTIL HFA;VENTOLIN HFA) 108 (90 BASE) MCG/ACT inhaler Inhale 1-2 puffs into the lungs every 6 (six) hours as needed for wheezing. 03/21/13   Isaias Sakai, NP  albuterol (PROVENTIL HFA;VENTOLIN HFA) 108 (90 BASE) MCG/ACT inhaler Inhale 1-2 puffs into the lungs every 6 (six) hours as needed for wheezing or shortness of breath. 09/16/13   Toy Cookey, MD  amoxicillin (AMOXIL) 500 MG capsule Take 1 capsule (500 mg total) by mouth 3 (three) times daily. 07/12/16   Arthor Captain, PA-C  cyanocobalamin 500 MCG tablet Take 500 mcg by mouth daily.    Historical Provider, MD  doxycycline  (VIBRAMYCIN) 100 MG capsule Take 1 capsule (100 mg total) by mouth 2 (two) times daily. One po bid x 7 days 09/16/13   Toy Cookey, MD  Multiple Vitamins-Minerals (MULTIVITAMINS THER. W/MINERALS) TABS Take 1 tablet by mouth daily.      Historical Provider, MD  ondansetron (ZOFRAN) 4 MG tablet Take 1 tablet (4 mg total) by mouth every 8 (eight) hours as needed for nausea or vomiting. 02/01/16   Arthor Captain, PA-C  oxyCODONE-acetaminophen (PERCOCET) 5-325 MG tablet Take 1-2 tablets by mouth every 4 (four) hours as needed. 02/01/16   Arthor Captain, PA-C  traMADol (ULTRAM) 50 MG tablet Take 1 tablet (50 mg total) by mouth every 6 (six) hours as needed. 07/12/16   Arthor Captain, PA-C    Family History History reviewed. No pertinent family history.  Social History Social History  Substance Use Topics  . Smoking status: Current Every Day Smoker    Packs/day: 1.00  . Smokeless tobacco: Never Used  . Alcohol use Yes     Comment: occ     Allergies   Codeine   Review of Systems Review of Systems  HENT: Positive for ear discharge and ear pain.   Eyes: Positive for pain and visual disturbance.     Physical Exam Updated Vital Signs BP 151/99 (BP Location: Right Arm)   Pulse 64   Temp 98.3 F (36.8 C) (Oral)   Resp 18   SpO2 99%  Physical Exam  Constitutional: She appears well-developed and well-nourished. No distress.  HENT:  Head: Normocephalic and atraumatic.  Eyes: Conjunctivae are normal.  Cardiovascular: Normal rate.   Pulmonary/Chest: Effort normal.  Abdominal: She exhibits no distension.  Neurological: She is alert.  Skin: Skin is warm and dry.  Psychiatric: She has a normal mood and affect.  Nursing note and vitals reviewed.    ED Treatments / Results  DIAGNOSTIC STUDIES:  Oxygen Saturation is 100% on RA, normal by my interpretation.    COORDINATION OF CARE:  3:28 PM Discussed treatment plan with pt at bedside and pt agreed to plan.   Labs (all labs  ordered are listed, but only abnormal results are displayed) Labs Reviewed - No data to display  EKG  EKG Interpretation None       Radiology No results found.  Procedures Procedures (including critical care time)  Medications Ordered in ED Medications  ciprofloxacin-dexamethasone (CIPRODEX) 0.3-0.1 % otic suspension 4 drop (4 drops Left Ear Given 07/12/16 1626)     Initial Impression / Assessment and Plan / ED Course  I have reviewed the triage vital signs and the nursing notes.  Pertinent labs & imaging results that were available during my care of the patient were reviewed by me and considered in my medical decision making (see chart for details).    Patient with otitis. Treat with abx oral and topical. Patient will be discharged with ENT follow up   Final Clinical Impressions(s) / ED Diagnoses   Final diagnoses:  Bleeding from left ear  Left ear pain    New Prescriptions Discharge Medication List as of 07/12/2016  3:58 PM    START taking these medications   Details  traMADol (ULTRAM) 50 MG tablet Take 1 tablet (50 mg total) by mouth every 6 (six) hours as needed., Starting Tue 07/12/2016, Print      I personally performed the services described in this documentation, which was scribed in my presence. The recorded information has been reviewed and is accurate.       Arthor Captainbigail Kayle Correa, PA-C 07/17/16 2021    Linwood DibblesJon Knapp, MD 07/19/16 [phone]

## 2019-09-18 ENCOUNTER — Ambulatory Visit: Payer: Self-pay | Admitting: Dermatology

## 2019-09-19 ENCOUNTER — Ambulatory Visit: Payer: Self-pay | Admitting: Dermatology

## 2019-11-13 ENCOUNTER — Other Ambulatory Visit: Payer: Self-pay

## 2019-11-13 ENCOUNTER — Emergency Department (HOSPITAL_COMMUNITY)
Admission: EM | Admit: 2019-11-13 | Discharge: 2019-11-13 | Disposition: A | Discharge status: Home / Self Care | Payer: Self-pay | Attending: Emergency Medicine | Admitting: Emergency Medicine

## 2019-11-13 DIAGNOSIS — Z5321 Procedure and treatment not carried out due to patient leaving prior to being seen by health care provider: Secondary | ICD-10-CM | POA: Insufficient documentation

## 2019-11-13 DIAGNOSIS — S80872A Other superficial bite, left lower leg, initial encounter: Secondary | ICD-10-CM | POA: Insufficient documentation

## 2019-11-13 DIAGNOSIS — W540XXA Bitten by dog, initial encounter: Secondary | ICD-10-CM | POA: Insufficient documentation

## 2019-11-13 DIAGNOSIS — Y929 Unspecified place or not applicable: Secondary | ICD-10-CM | POA: Insufficient documentation

## 2019-11-13 DIAGNOSIS — Y999 Unspecified external cause status: Secondary | ICD-10-CM | POA: Insufficient documentation

## 2019-11-13 DIAGNOSIS — Y939 Activity, unspecified: Secondary | ICD-10-CM | POA: Insufficient documentation

## 2019-11-13 NOTE — ED Triage Notes (Signed)
Pt states she's going to [facility]. Pt advised to stay. Pt continues to say she wants to leave

## 2019-11-13 NOTE — ED Triage Notes (Addendum)
Pt presents to ED POV. Pt states that she was bit by dog. Pt has bite to L leg. Pt states that she has unknown dog and unknown tetanus. Bleeding controlled

## 2019-11-14 ENCOUNTER — Emergency Department (HOSPITAL_COMMUNITY)
Admission: EM | Admit: 2019-11-14 | Discharge: 2019-11-14 | Disposition: A | Discharge status: Home / Self Care | Payer: Self-pay | Attending: Emergency Medicine | Admitting: Emergency Medicine

## 2019-11-14 ENCOUNTER — Encounter (HOSPITAL_COMMUNITY): Payer: Self-pay | Admitting: Emergency Medicine

## 2019-11-14 DIAGNOSIS — Y998 Other external cause status: Secondary | ICD-10-CM | POA: Insufficient documentation

## 2019-11-14 DIAGNOSIS — S81812A Laceration without foreign body, left lower leg, initial encounter: Secondary | ICD-10-CM | POA: Insufficient documentation

## 2019-11-14 DIAGNOSIS — F17210 Nicotine dependence, cigarettes, uncomplicated: Secondary | ICD-10-CM | POA: Insufficient documentation

## 2019-11-14 DIAGNOSIS — Y929 Unspecified place or not applicable: Secondary | ICD-10-CM | POA: Insufficient documentation

## 2019-11-14 DIAGNOSIS — Z79899 Other long term (current) drug therapy: Secondary | ICD-10-CM | POA: Insufficient documentation

## 2019-11-14 DIAGNOSIS — Z2914 Encounter for prophylactic rabies immune globin: Secondary | ICD-10-CM | POA: Insufficient documentation

## 2019-11-14 DIAGNOSIS — Y939 Activity, unspecified: Secondary | ICD-10-CM | POA: Insufficient documentation

## 2019-11-14 DIAGNOSIS — Z23 Encounter for immunization: Secondary | ICD-10-CM | POA: Insufficient documentation

## 2019-11-14 DIAGNOSIS — W540XXA Bitten by dog, initial encounter: Secondary | ICD-10-CM | POA: Insufficient documentation

## 2019-11-14 DIAGNOSIS — Z203 Contact with and (suspected) exposure to rabies: Secondary | ICD-10-CM | POA: Insufficient documentation

## 2019-11-14 DIAGNOSIS — J449 Chronic obstructive pulmonary disease, unspecified: Secondary | ICD-10-CM | POA: Insufficient documentation

## 2019-11-14 MED ORDER — RABIES IMMUNE GLOBULIN 150 UNIT/ML IM INJ
975.0000 [IU] | INJECTION | Freq: Once | INTRAMUSCULAR | Status: AC
  Administered 2019-11-14: 975 [IU] via INTRAMUSCULAR
  Filled 2019-11-14: qty 8

## 2019-11-14 MED ORDER — TETANUS-DIPHTH-ACELL PERTUSSIS 5-2.5-18.5 LF-MCG/0.5 IM SUSP
0.5000 mL | Freq: Once | INTRAMUSCULAR | Status: AC
  Administered 2019-11-14: 0.5 mL via INTRAMUSCULAR
  Filled 2019-11-14: qty 0.5

## 2019-11-14 MED ORDER — AMOXICILLIN-POT CLAVULANATE 875-125 MG PO TABS
1.0000 | ORAL_TABLET | Freq: Once | ORAL | Status: AC
  Administered 2019-11-14: 1 via ORAL
  Filled 2019-11-14: qty 1

## 2019-11-14 MED ORDER — LIDOCAINE-EPINEPHRINE (PF) 2 %-1:200000 IJ SOLN
10.0000 mL | Freq: Once | INTRAMUSCULAR | Status: AC
  Administered 2019-11-14: 10 mL
  Filled 2019-11-14: qty 20

## 2019-11-14 MED ORDER — AMOXICILLIN-POT CLAVULANATE 875-125 MG PO TABS
1.0000 | ORAL_TABLET | Freq: Two times a day (BID) | ORAL | 0 refills | Status: AC
Start: 2019-11-14 — End: 2019-11-24

## 2019-11-14 MED ORDER — RABIES VACCINE, PCEC IM SUSR
1.0000 mL | Freq: Once | INTRAMUSCULAR | Status: AC
  Administered 2019-11-14: 1 mL via INTRAMUSCULAR
  Filled 2019-11-14: qty 1

## 2019-11-14 NOTE — ED Provider Notes (Addendum)
[facility] DEPT Provider Note   CSN: [id number] Arrival date & time: 11/14/19  1645     History Chief Complaint  Patient presents with  . Animal Bite    Monica Miranda is a 59 y.o. female.  HPI   Patient mentioned emergency department with chief complaint of dog bite to her left leg that happened yesterday.  Patient states a stray dog bit her leg and she is  Unaware of its  vaccination records.  She also admits that her last tetanus was 9 years ago.  She denies falling, hitting her head, losing consciousness denies fever, chills.  Patient able to ambulate on her left legt.  She tried to go to the emergency department yesterday but left due to a long wait.  She has no significant medical history.  She is not on any blood thinners, she does not take any medication on daily basis. Patient denies fever, chills, shortness of breath, abdominal pain.  Past Medical History:  Diagnosis Date  . COPD (chronic obstructive pulmonary disease) ([place])   . Rib fracture    4 months ago    There are no problems to display for this patient.   Past Surgical History:  Procedure Laterality Date  . ESOPHAGEAL DILATION       OB History   No obstetric history on file.     No family history on file.  Social History   Tobacco Use  . Smoking status: Current Every Day Smoker    Packs/day: 1.00  . Smokeless tobacco: Never Used  Substance Use Topics  . Alcohol use: Yes    Comment: occ  . Drug use: No    Home Medications Prior to Admission medications   Medication Sig Start Date End Date Taking? Authorizing Provider  albuterol (PROVENTIL HFA;VENTOLIN HFA) 108 (90 BASE) MCG/ACT inhaler Inhale 1-2 puffs into the lungs every 6 (six) hours as needed for wheezing. 03/21/13   Dorice Lamas, NP  albuterol (PROVENTIL HFA;VENTOLIN HFA) 108 (90 BASE) MCG/ACT inhaler Inhale 1-2 puffs into the lungs every 6 (six) hours as needed for wheezing or shortness of breath.  09/16/13   Ernestina Patches, MD  amoxicillin (AMOXIL) 500 MG capsule Take 1 capsule (500 mg total) by mouth 3 (three) times daily. 07/12/16   Margarita Mail, PA-C  amoxicillin-clavulanate (AUGMENTIN) 875-125 MG tablet Take 1 tablet by mouth every 12 (twelve) hours for 10 days. 11/14/19 11/24/19  Marcello Fennel, PA-C  cyanocobalamin 500 MCG tablet Take 500 mcg by mouth daily.    [provider]  doxycycline (VIBRAMYCIN) 100 MG capsule Take 1 capsule (100 mg total) by mouth 2 (two) times daily. One po bid x 7 days 09/16/13   Ernestina Patches, MD  Multiple Vitamins-Minerals (MULTIVITAMINS THER. W/MINERALS) TABS Take 1 tablet by mouth daily.      [provider]  ondansetron (ZOFRAN) 4 MG tablet Take 1 tablet (4 mg total) by mouth every 8 (eight) hours as needed for nausea or vomiting. 02/01/16   Margarita Mail, PA-C  oxyCODONE-acetaminophen (PERCOCET) 5-325 MG tablet Take 1-2 tablets by mouth every 4 (four) hours as needed. 02/01/16   Margarita Mail, PA-C  traMADol (ULTRAM) 50 MG tablet Take 1 tablet (50 mg total) by mouth every 6 (six) hours as needed. 07/12/16   Margarita Mail, PA-C    Allergies    Codeine  Review of Systems   Review of Systems  Constitutional: Negative for chills and fever.  HENT: Negative for congestion and sore throat.  Respiratory: Negative for shortness of breath.   Cardiovascular: Negative for chest pain.  Gastrointestinal: Negative for abdominal pain.  Genitourinary: Negative for enuresis.  Musculoskeletal: Negative for back pain.       Planes of lower left leg pain.  Has a laceration on the lateral side of her left leg as well as a laceration on the medial side of her left leg.  Skin: Negative for rash.  Neurological: Negative for dizziness.  Hematological: Does not bruise/bleed easily.    Physical Exam Updated Vital Signs BP (!) 160/106 (BP Location: Right Arm)   Pulse 80   Temp 98.3 F (36.8 C) (Oral)   Resp 16   Ht 5\' 1"  (1.549 m)   Wt  49.9 kg   SpO2 100%   BMI 20.78 kg/m   Physical Exam Vitals and nursing note reviewed.  Constitutional:      General: She is not in acute distress.    Appearance: Normal appearance. She is not ill-appearing or diaphoretic.  HENT:     Head: Normocephalic and atraumatic.     Nose: No congestion or rhinorrhea.  Eyes:     General: No scleral icterus.       Right eye: No discharge.        Left eye: No discharge.     Conjunctiva/sclera: Conjunctivae normal.  Pulmonary:     Effort: Pulmonary effort is normal. No respiratory distress.     Breath sounds: Normal breath sounds. No wheezing.  Musculoskeletal:     Cervical back: Neck supple.     Right lower leg: No edema.     Left lower leg: No edema.     Comments: Patient's lower left leg has a 8 cm laceration on the lateral side of her leg.  She also has a 5 cm laceration on the medial side of her leg.  There was no erythema, ecchymosis or drainage noted at the site.  It was tender to palpation, she has good pedal pulses, good cap refill, has good range of motion 5 out of 5 strength in her ankle and toes.  Skin:    General: Skin is warm and dry.     Coloration: Skin is not jaundiced or pale.  Neurological:     Mental Status: She is alert and oriented to person, place, and time.  Psychiatric:        Mood and Affect: Mood normal.     ED Results / Procedures / Treatments   Labs (all labs ordered are listed, but only abnormal results are displayed) Labs Reviewed - No data to display  EKG None  Radiology No results found.  Procedures . Laceration Repair  Date/Time: 11/14/2019 7:15 PM Performed by: 01/14/2020, PA-C Authorized by: Carroll Sage, PA-C   Consent:    Consent obtained:  Verbal   Consent given by:  Patient   Risks discussed:  Infection, pain, retained foreign body, need for additional repair, poor cosmetic result, tendon damage, nerve damage, poor wound healing and vascular damage   Alternatives  discussed:  No treatment Anesthesia (see MAR for exact dosages):    Anesthesia method:  Local infiltration   Local anesthetic:  Lidocaine 2% WITH epi Laceration details:    Location:  Leg   Leg location:  L lower leg   Length (cm):  8   Depth (mm):  1 Repair type:    Repair type:  Simple Pre-procedure details:    Preparation:  Patient was prepped and draped in usual sterile fashion  Exploration:    Wound exploration: wound explored through full range of motion and entire depth of wound probed and visualized     Contaminated: no   Treatment:    Area cleansed with:  Saline   Amount of cleaning:  Extensive   Irrigation method:  Syringe   Visualized foreign bodies/material removed: no   Skin repair:    Repair method:  Sutures   Suture size:  4-0   Suture material:  Prolene   Number of sutures:  3 Approximation:    Approximation:  Loose Post-procedure details:    Dressing:  Non-adherent dressing   Patient tolerance of procedure:  Tolerated well, no immediate complications .Marland KitchenLaceration Repair  Date/Time: 11/14/2019 7:17 PM Performed by: Carroll Sage, PA-C Authorized by: Carroll Sage, PA-C   Consent:    Consent obtained:  Verbal   Consent given by:  Patient   Risks discussed:  Infection, pain, retained foreign body, need for additional repair, tendon damage, poor cosmetic result, nerve damage, poor wound healing and vascular damage   Alternatives discussed:  No treatment Anesthesia (see MAR for exact dosages):    Anesthesia method:  Local infiltration   Local anesthetic:  Lidocaine 2% WITH epi Laceration details:    Location:  Leg   Leg location:  L lower leg   Length (cm):  5   Depth (mm):  1 Repair type:    Repair type:  Simple Pre-procedure details:    Preparation:  Patient was prepped and draped in usual sterile fashion Exploration:    Wound exploration: wound explored through full range of motion and entire depth of wound probed and visualized      Contaminated: no   Treatment:    Area cleansed with:  Saline   Amount of cleaning:  Extensive   Irrigation solution:  Sterile water   Irrigation method:  Syringe   Visualized foreign bodies/material removed: no   Skin repair:    Repair method:  Sutures   Suture size:  4-0   Suture material:  Prolene   Number of sutures:  1 Approximation:    Approximation:  Loose Post-procedure details:    Dressing:  Non-adherent dressing   Patient tolerance of procedure:  Tolerated well, no immediate complications   (including critical care time)  Medications Ordered in ED Medications  rabies vaccine (RABAVERT) injection 1 mL (has no administration in time range)  rabies immune globulin (HYPERAB/KEDRAB) injection 975 Units (975 Units Intramuscular Given 11/14/19 1916)  Tdap (BOOSTRIX) injection 0.5 mL (0.5 mLs Intramuscular Given 11/14/19 1823)  amoxicillin-clavulanate (AUGMENTIN) 875-125 MG per tablet 1 tablet (1 tablet Oral Given 11/14/19 1820)  lidocaine-EPINEPHrine (XYLOCAINE W/EPI) 2 %-1:200000 (PF) injection 10 mL (10 mLs Infiltration Given 11/14/19 1828)    ED Course  I have reviewed the triage vital signs and the nursing notes.  Pertinent labs & imaging results that were available during my care of the patient were reviewed by me and considered in my medical decision making (see chart for details).    MDM Rules/Calculators/A&P                          I have personally reviewed all imaging, labs and have interpreted them.  I am most concerned for cellulitis versus fracture versus foreign object.  I have low suspicion that the patient has suffered a fracture in her leg as she was able to ambulate foot without difficulty.  She has equal strength and good range  of motion bilaterally in her knee, ankle, toes.  Low suspicion of  foreign object in the wound as I was able to irrigate the wound and did not find any foreign bodies in it.  There is no indication for imaging at this time.  I have low  suspicion for cellulitis as there is no overlying erythema around the wound, no drainage, no pustulant noted inside the wound.  Patient is afebrile and nontachycardic.  Generally animal bite wounds are not sutured closed due to high risk of  Infection.  Due to the large size of the wound it was decided that the wound be close partially with sutures.  As this will help reduce risk of infection and aid in the healing process.  The wound was approximated and left loose, leaving enough room for drainage if it occurs.  Risks and benefits were discussed with the attending about this procedure and he agreed with plan.   Patient appears to be resting comfortably, is not in any acute distress, not having any difficulty breathing. Her vitals have remained stable. Patient does not meet emergent criteria to be admitted to the hospital. It is likely that the patient has suffered a laceration from a dog bite and will be placed on antibiotics and the given rabies vaccine as well as follow up for the rabies vaccination schedule. She was given strict at home instructions as well as return precautions. Patient was discussed with attending who agrees with assessment and plan. Patient was explained the results and plan, patient verbalized that she understood and agrees with above plan. Final Clinical Impression(s) / ED Diagnoses Final diagnoses:  Dog bite, initial encounter  Leg laceration, left, initial encounter    Rx / DC Orders ED Discharge Orders         Ordered    amoxicillin-clavulanate (AUGMENTIN) 875-125 MG tablet  Every 12 hours     Discontinue  Reprint     11/14/19 1905           Carroll Sage, PA-C 11/14/19 1927    Melene Plan, DO 11/14/19 1931    Carroll Sage, PA-C 11/15/19 1438    Melene Plan, DO 11/15/19 1508

## 2019-11-14 NOTE — ED Triage Notes (Signed)
Per pt, states she was bit by stray dog yesterday-open wound, bleeding controlled to left lower leg, above ankle

## 2019-11-14 NOTE — Discharge Instructions (Addendum)
You have been treated for a laceration on your left leg.  I have placed you on antibiotics please take as prescribed.  I have placed stitches in your leg I want you to follow-up with your primary care doctor or urgent care or emergency room for stitches removal in 7 to 10 days as well as a wound check.  You can alternate between taking ibuprofen and Tylenol for pain.  For example take ibuprofen wait 6 hours then take Tylenol wait another 6 hours and repeat.  Follow dosage and on the bottle.  Apply ice to the area and keep it elevated as this will help with inflammation and swelling.  Do not shower or get the wound wet for the first 24 hours after that you may run clean water over the wound and apply a new dressing to it twice a day.  Please follow-up at an urgent care for your rabies vaccine it is imperative that you do this.  Please follow-up at a urgent care, family care, emergency room for stitches removal in 7 to 10 days. Follow-up at urgent care for rabies vaccine.  I want you to come back to the emergency department if you develop fever, increased redness or swelling around your wound, drainage, discharge, severe pain in your leg as the symptoms require further evaluation and management.

## 2021-05-25 ENCOUNTER — Inpatient Hospital Stay (HOSPITAL_COMMUNITY): Payer: Medicaid Other

## 2021-05-25 ENCOUNTER — Encounter: Payer: Self-pay | Admitting: Intensive Care

## 2021-05-25 ENCOUNTER — Emergency Department: Payer: Medicaid Other

## 2021-05-25 ENCOUNTER — Emergency Department
Admission: EM | Admit: 2021-05-25 | Discharge: 2021-05-25 | Disposition: A | Discharge status: Other Acute Inpatient Hospital | Payer: Medicaid Other | Attending: Emergency Medicine | Admitting: Emergency Medicine

## 2021-05-25 ENCOUNTER — Inpatient Hospital Stay (HOSPITAL_COMMUNITY)
Admission: AD | Admit: 2021-05-25 | Discharge: 2021-05-29 | DRG: 057 | Disposition: A | Discharge status: Rehabilitation Facility | Payer: Medicaid Other | Source: Other Acute Inpatient Hospital | Attending: Internal Medicine | Admitting: Internal Medicine

## 2021-05-25 ENCOUNTER — Other Ambulatory Visit: Payer: Self-pay

## 2021-05-25 DIAGNOSIS — F1990 Other psychoactive substance use, unspecified, uncomplicated: Secondary | ICD-10-CM | POA: Diagnosis not present

## 2021-05-25 DIAGNOSIS — I610 Nontraumatic intracerebral hemorrhage in hemisphere, subcortical: Secondary | ICD-10-CM | POA: Diagnosis not present

## 2021-05-25 DIAGNOSIS — I6389 Other cerebral infarction: Secondary | ICD-10-CM | POA: Diagnosis not present

## 2021-05-25 DIAGNOSIS — J449 Chronic obstructive pulmonary disease, unspecified: Secondary | ICD-10-CM | POA: Insufficient documentation

## 2021-05-25 DIAGNOSIS — F1410 Cocaine abuse, uncomplicated: Secondary | ICD-10-CM | POA: Diagnosis present

## 2021-05-25 DIAGNOSIS — F1510 Other stimulant abuse, uncomplicated: Secondary | ICD-10-CM | POA: Diagnosis present

## 2021-05-25 DIAGNOSIS — Z79899 Other long term (current) drug therapy: Secondary | ICD-10-CM | POA: Diagnosis not present

## 2021-05-25 DIAGNOSIS — I10 Essential (primary) hypertension: Secondary | ICD-10-CM | POA: Diagnosis present

## 2021-05-25 DIAGNOSIS — R569 Unspecified convulsions: Secondary | ICD-10-CM | POA: Diagnosis not present

## 2021-05-25 DIAGNOSIS — S06369A Traumatic hemorrhage of cerebrum, unspecified, with loss of consciousness of unspecified duration, initial encounter: Secondary | ICD-10-CM | POA: Insufficient documentation

## 2021-05-25 DIAGNOSIS — E785 Hyperlipidemia, unspecified: Secondary | ICD-10-CM | POA: Diagnosis present

## 2021-05-25 DIAGNOSIS — M7989 Other specified soft tissue disorders: Secondary | ICD-10-CM | POA: Diagnosis not present

## 2021-05-25 DIAGNOSIS — F17210 Nicotine dependence, cigarettes, uncomplicated: Secondary | ICD-10-CM | POA: Diagnosis present

## 2021-05-25 DIAGNOSIS — H5347 Heteronymous bilateral field defects: Secondary | ICD-10-CM | POA: Diagnosis present

## 2021-05-25 DIAGNOSIS — G441 Vascular headache, not elsewhere classified: Secondary | ICD-10-CM | POA: Diagnosis not present

## 2021-05-25 DIAGNOSIS — W07XXXA Fall from chair, initial encounter: Secondary | ICD-10-CM | POA: Insufficient documentation

## 2021-05-25 DIAGNOSIS — R4182 Altered mental status, unspecified: Secondary | ICD-10-CM

## 2021-05-25 DIAGNOSIS — Z20822 Contact with and (suspected) exposure to covid-19: Secondary | ICD-10-CM | POA: Diagnosis not present

## 2021-05-25 DIAGNOSIS — I69154 Hemiplegia and hemiparesis following nontraumatic intracerebral hemorrhage affecting left non-dominant side: Secondary | ICD-10-CM | POA: Diagnosis present

## 2021-05-25 DIAGNOSIS — I69254 Hemiplegia and hemiparesis following other nontraumatic intracranial hemorrhage affecting left non-dominant side: Secondary | ICD-10-CM | POA: Diagnosis not present

## 2021-05-25 DIAGNOSIS — F1910 Other psychoactive substance abuse, uncomplicated: Secondary | ICD-10-CM | POA: Diagnosis not present

## 2021-05-25 DIAGNOSIS — Z885 Allergy status to narcotic agent status: Secondary | ICD-10-CM | POA: Diagnosis not present

## 2021-05-25 DIAGNOSIS — K5901 Slow transit constipation: Secondary | ICD-10-CM | POA: Diagnosis not present

## 2021-05-25 DIAGNOSIS — L8990 Pressure ulcer of unspecified site, unspecified stage: Secondary | ICD-10-CM | POA: Insufficient documentation

## 2021-05-25 DIAGNOSIS — Z9114 Patient's other noncompliance with medication regimen: Secondary | ICD-10-CM | POA: Diagnosis not present

## 2021-05-25 DIAGNOSIS — I619 Nontraumatic intracerebral hemorrhage, unspecified: Secondary | ICD-10-CM | POA: Diagnosis present

## 2021-05-25 DIAGNOSIS — S0631AD Contusion and laceration of right cerebrum with loss of consciousness status unknown, subsequent encounter: Secondary | ICD-10-CM

## 2021-05-25 DIAGNOSIS — S0990XA Unspecified injury of head, initial encounter: Secondary | ICD-10-CM | POA: Diagnosis present

## 2021-05-25 HISTORY — DX: Cerebral infarction, unspecified: I63.9

## 2021-05-25 LAB — CBC WITH DIFFERENTIAL/PLATELET
Abs Immature Granulocytes: 0.02 10*3/uL (ref 0.00–0.07)
Basophils Absolute: 0 10*3/uL (ref 0.0–0.1)
Basophils Relative: 1 %
Eosinophils Absolute: 0.8 10*3/uL — ABNORMAL HIGH (ref 0.0–0.5)
Eosinophils Relative: 11 %
HCT: 44.2 % (ref 36.0–46.0)
Hemoglobin: 14.6 g/dL (ref 12.0–15.0)
Immature Granulocytes: 0 %
Lymphocytes Relative: 12 %
Lymphs Abs: 0.9 10*3/uL (ref 0.7–4.0)
MCH: 28.5 pg (ref 26.0–34.0)
MCHC: 33 g/dL (ref 30.0–36.0)
MCV: 86.3 fL (ref 80.0–100.0)
Monocytes Absolute: 0.9 10*3/uL (ref 0.1–1.0)
Monocytes Relative: 11 %
Neutro Abs: 5.1 10*3/uL (ref 1.7–7.7)
Neutrophils Relative %: 65 %
Platelets: 384 10*3/uL (ref 150–400)
RBC: 5.12 MIL/uL — ABNORMAL HIGH (ref 3.87–5.11)
RDW: 15 % (ref 11.5–15.5)
WBC: 7.7 10*3/uL (ref 4.0–10.5)
nRBC: 0 % (ref 0.0–0.2)

## 2021-05-25 LAB — TSH: TSH: 1.312 u[IU]/mL (ref 0.350–4.500)

## 2021-05-25 LAB — RESP PANEL BY RT-PCR (FLU A&B, COVID) ARPGX2
Influenza A by PCR: NEGATIVE
Influenza B by PCR: NEGATIVE
SARS Coronavirus 2 by RT PCR: NEGATIVE

## 2021-05-25 LAB — COMPREHENSIVE METABOLIC PANEL
ALT: 39 U/L (ref 0–44)
AST: 36 U/L (ref 15–41)
Albumin: 3.5 g/dL (ref 3.5–5.0)
Alkaline Phosphatase: 138 U/L — ABNORMAL HIGH (ref 38–126)
Anion gap: 6 (ref 5–15)
BUN: 15 mg/dL (ref 8–23)
CO2: 25 mmol/L (ref 22–32)
Calcium: 9.2 mg/dL (ref 8.9–10.3)
Chloride: 108 mmol/L (ref 98–111)
Creatinine, Ser: 0.5 mg/dL (ref 0.44–1.00)
GFR, Estimated: >60 mL/min (ref >60)
Glucose, Bld: 92 mg/dL (ref 70–99)
Potassium: 3.7 mmol/L (ref 3.5–5.1)
Sodium: 139 mmol/L (ref 135–145)
Total Bilirubin: 0.4 mg/dL (ref 0.3–1.2)
Total Protein: 6 g/dL — ABNORMAL LOW (ref 6.5–8.1)

## 2021-05-25 LAB — PROTIME-INR
INR: 0.9 (ref 0.8–1.2)
Prothrombin Time: 11.8 seconds (ref 11.4–15.2)

## 2021-05-25 LAB — TROPONIN I (HIGH SENSITIVITY): Troponin I (High Sensitivity): 16 ng/L (ref <18)

## 2021-05-25 IMAGING — DX DG CHEST 1V PORT
1 series · 1 of 1 positions shown · non-contrast
Comparison: Chest radiograph dated [DATE].

CLINICAL DATA: Altered mental status.

EXAM:
PORTABLE CHEST 1 VIEW

[chest ap]
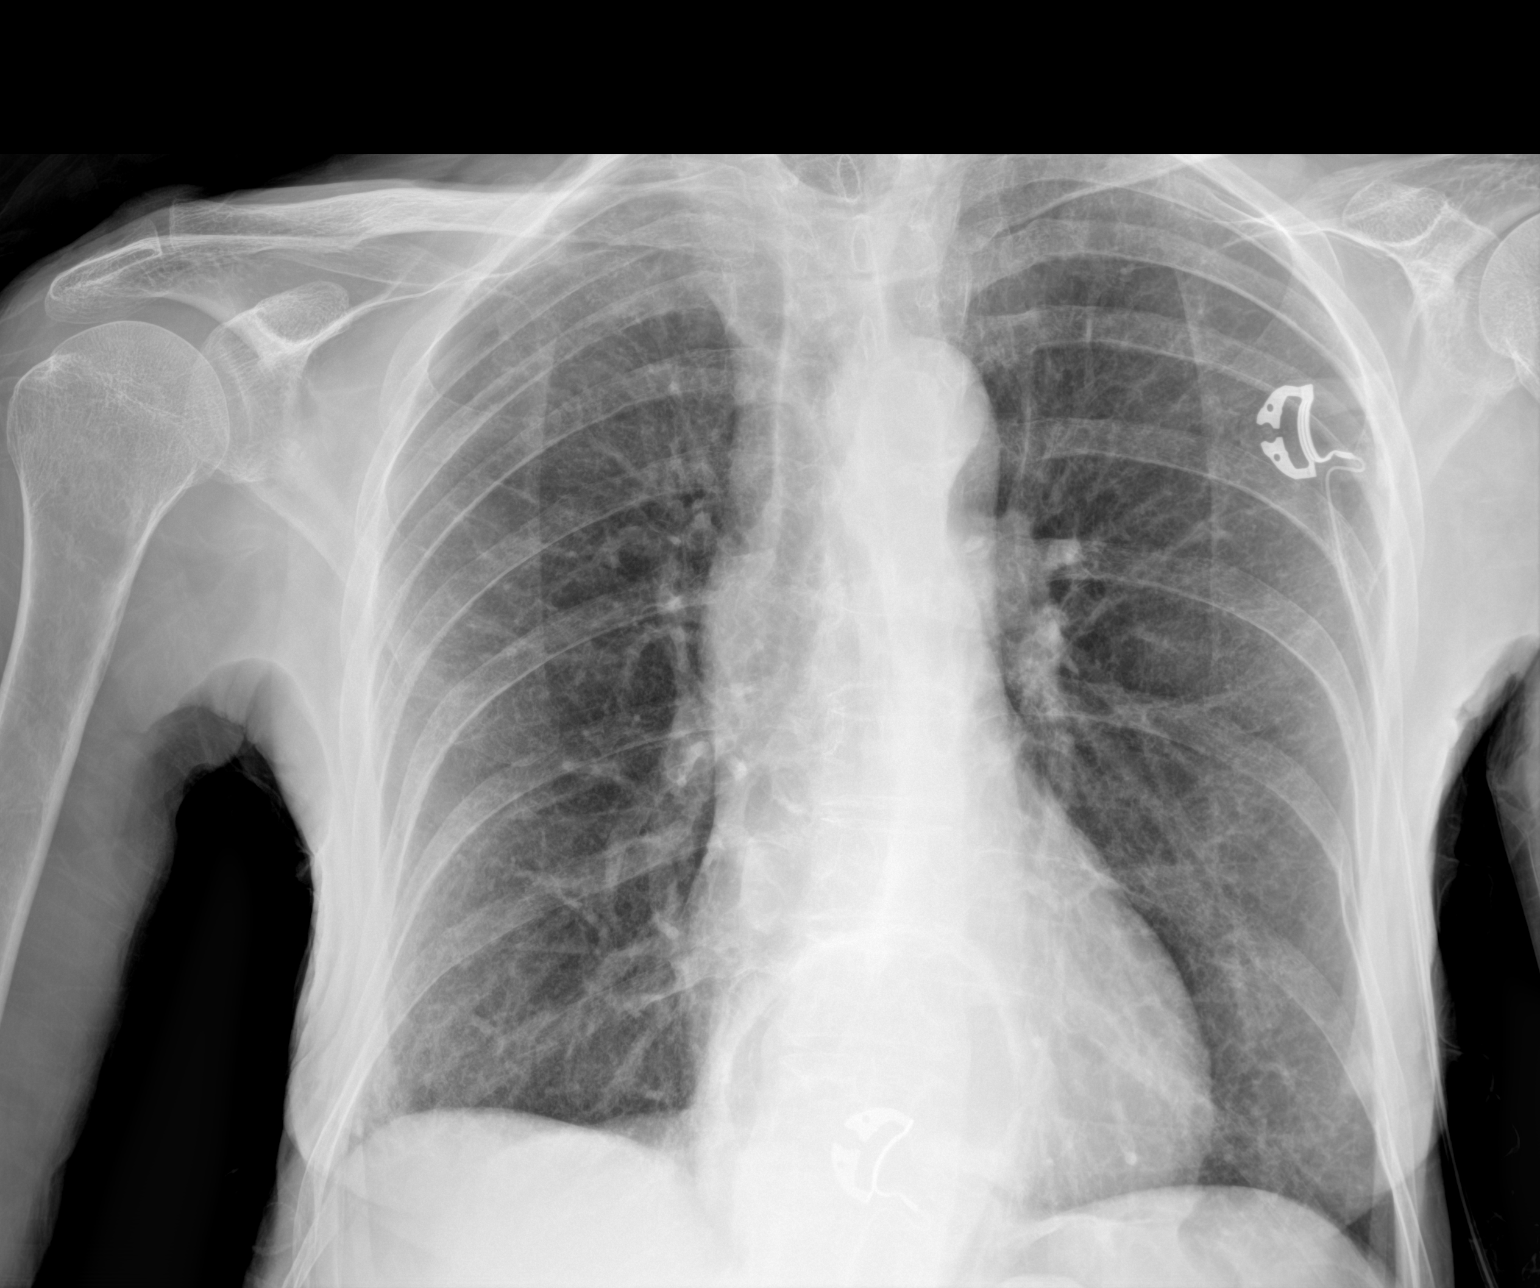

[1 of 1 positions shown; findings below may reference images not displayed]

FINDINGS: There is chronic interstitial coarsening. No focal consolidation,
pleural effusion, pneumothorax. The cardiac silhouette is within
limits atherosclerotic calcification of the aorta. Moderate size
hiatal hernia increased since the prior radiograph. No acute osseous
pathology.
IMPRESSION: No acute cardiopulmonary process.

## 2021-05-25 IMAGING — DX DG PORTABLE PELVIS
1 series · 1 of 1 positions shown · non-contrast
Comparison: None.

CLINICAL DATA: Altered mental status and recent fall with pelvic
pain, initial encounter

EXAM:
PORTABLE PELVIS 1-2 VIEWS

[pelvis ap]
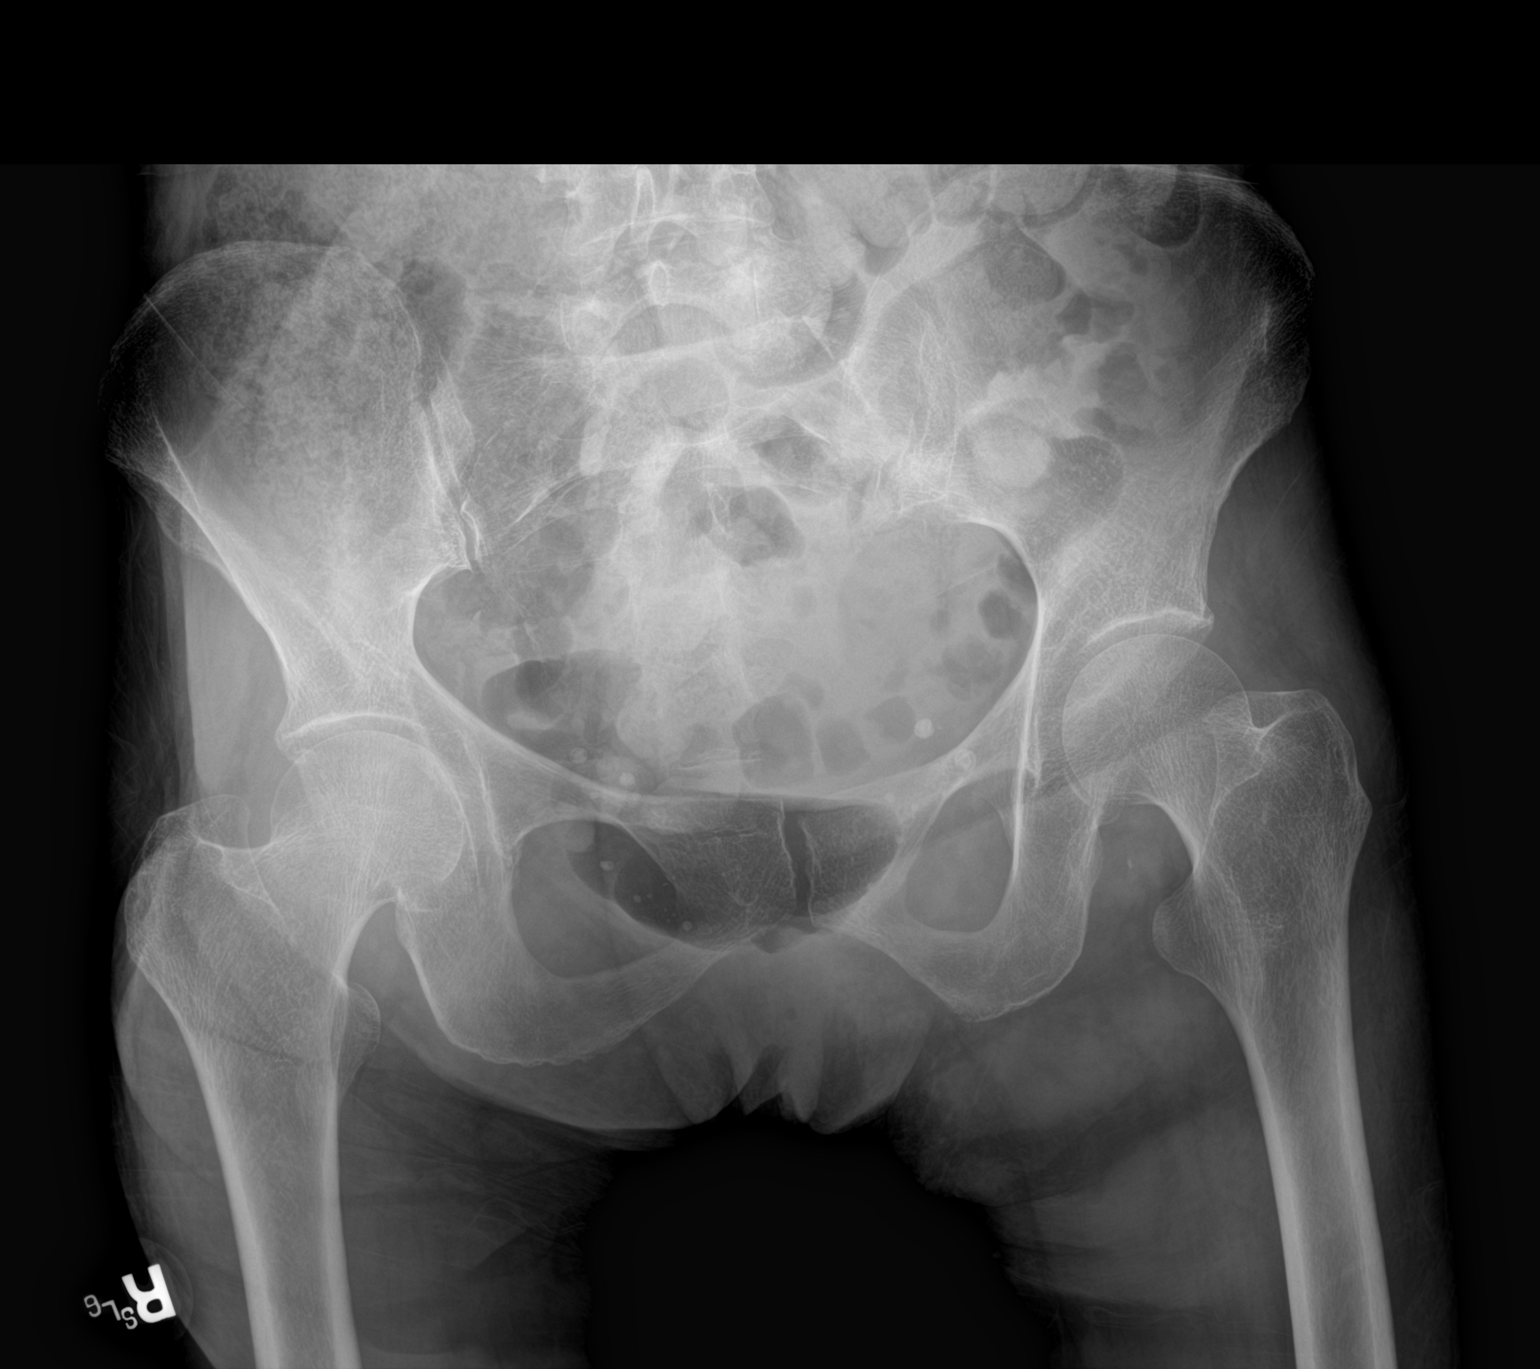

[1 of 1 positions shown; findings below may reference images not displayed]

FINDINGS: There is no evidence of pelvic fracture or diastasis. No pelvic bone
lesions are seen. Mild colonic constipation is noted.
IMPRESSION: No acute bony abnormality noted.

Changes suggestive of mild colonic constipation.

## 2021-05-25 IMAGING — CT CT HEAD W/O CM
4 series · 15 of 47 positions shown, 17 images · non-contrast
Comparison: None.

CLINICAL DATA: Altered mental status.

EXAM:
CT HEAD WITHOUT CONTRAST
CT CERVICAL SPINE WITHOUT CONTRAST
TECHNIQUE: Multidetector CT imaging of the head and cervical spine was
performed following the standard protocol without intravenous
contrast. Multiplanar CT image reconstructions of the cervical spine
were also generated.

[Series 2: head wo · axial · 0.41mm/px · z∈[+692,+797]mm · 7 of 29 slices shown, 9 images]
[im 4/29  brain]
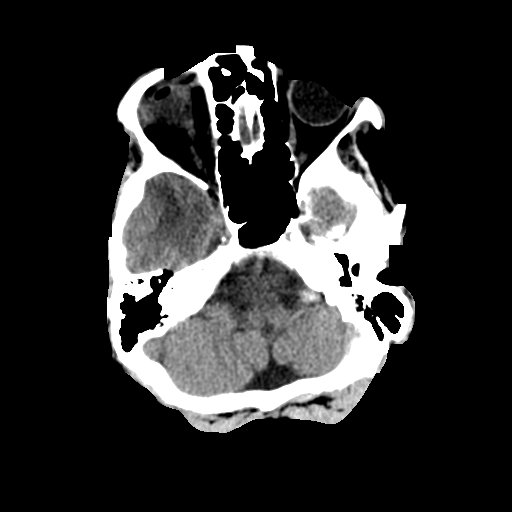
[im 4/29  bone]
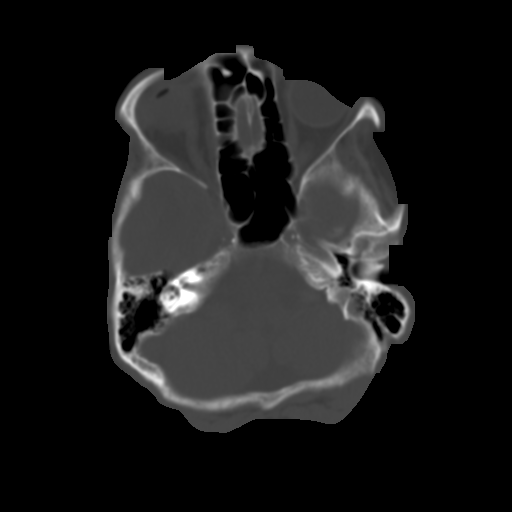
[im 8/29  brain]
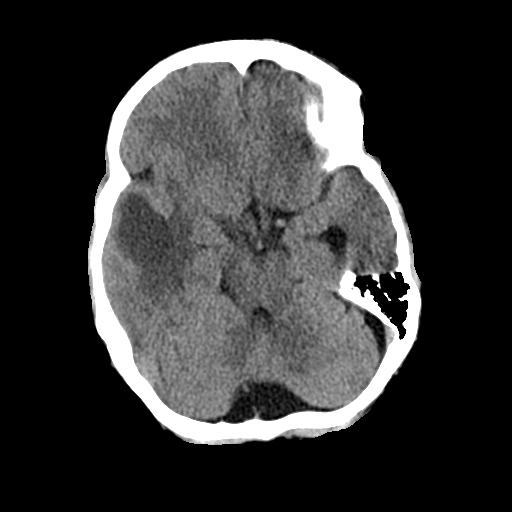
[im 11/29  brain]
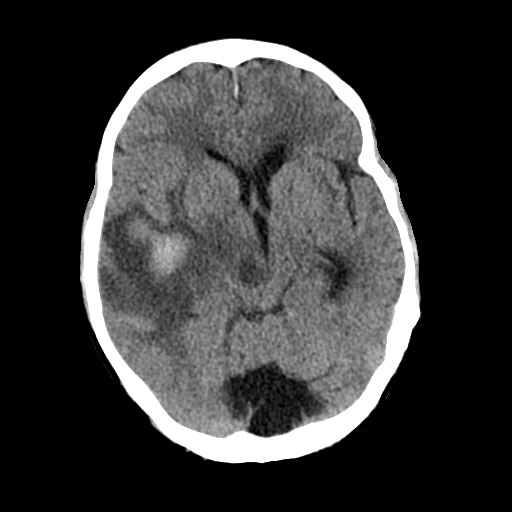
[im 15/29  brain]
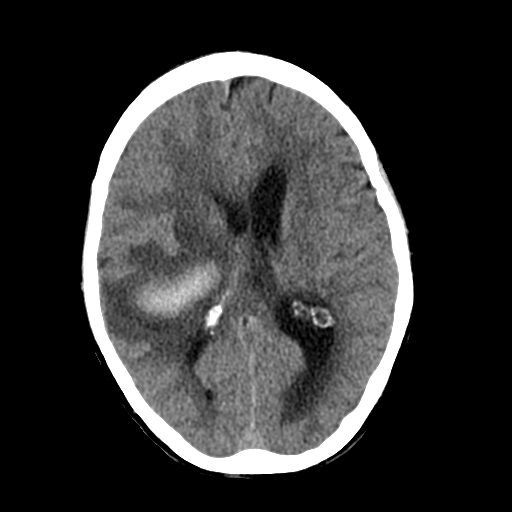
[im 18/29  brain]
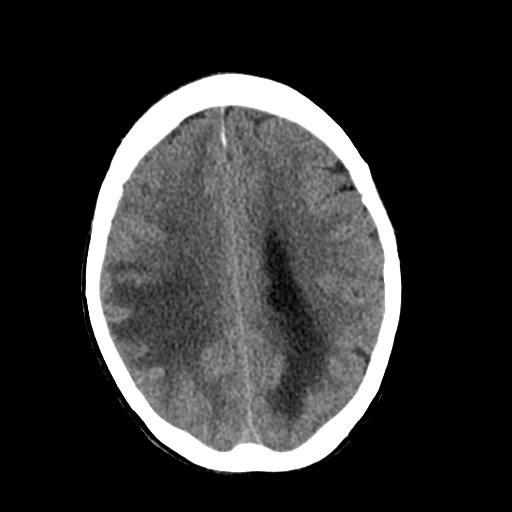
[im 18/29  bone]
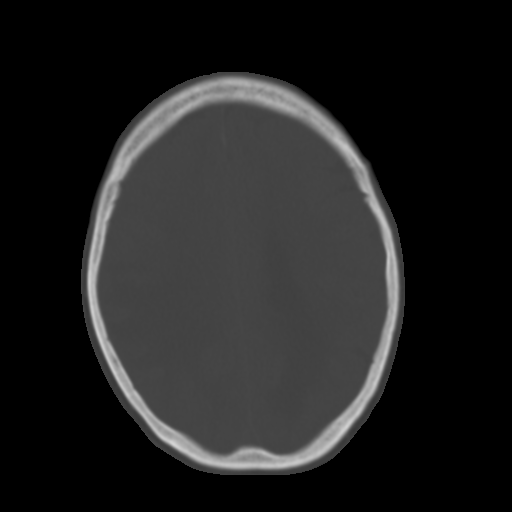
[im 22/29  brain]
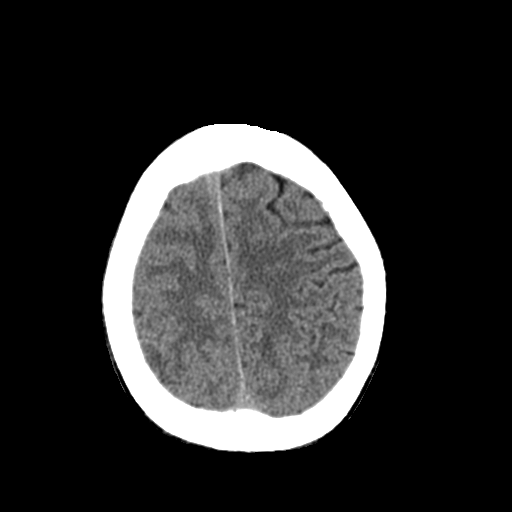
[im 25/29  brain]
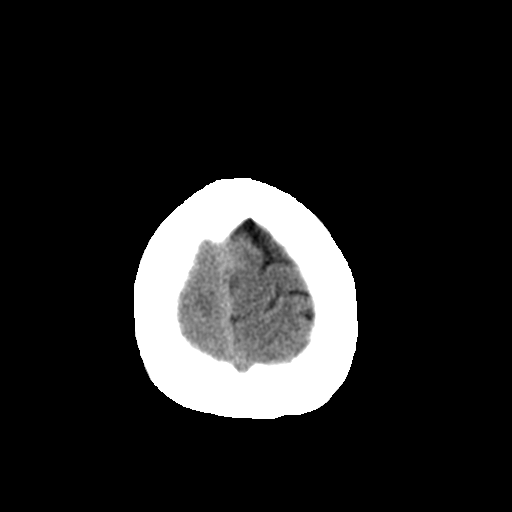

[Series 3: head bone · axial · 0.41mm/px · z∈[+691,+705]mm · 2 of 72 slices shown]
[im 8/72  bone]
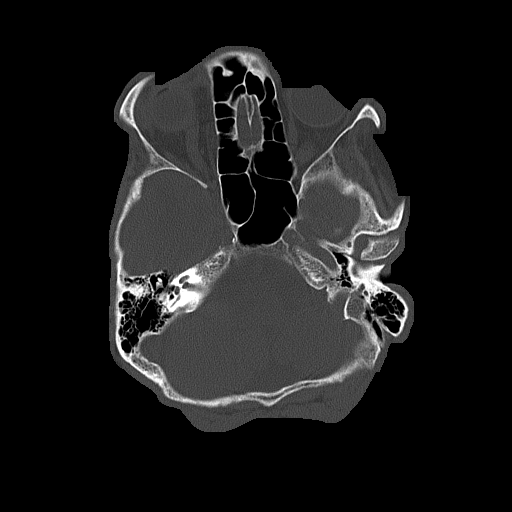
[im 15/72  bone]
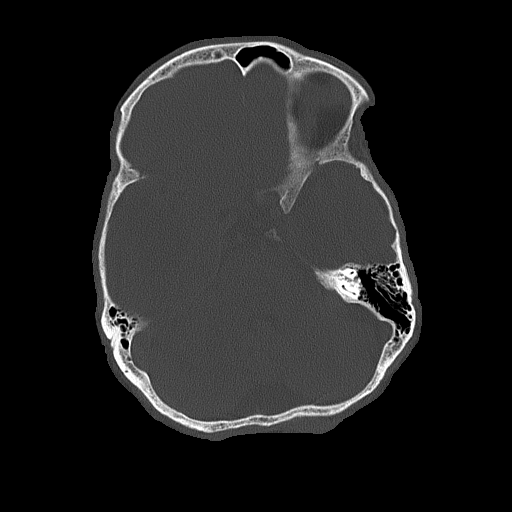

[Series 4: coronal soft tissue · coronal · 0.29mm/px · 3 of 60 slices shown]
[im 20/60  brain]
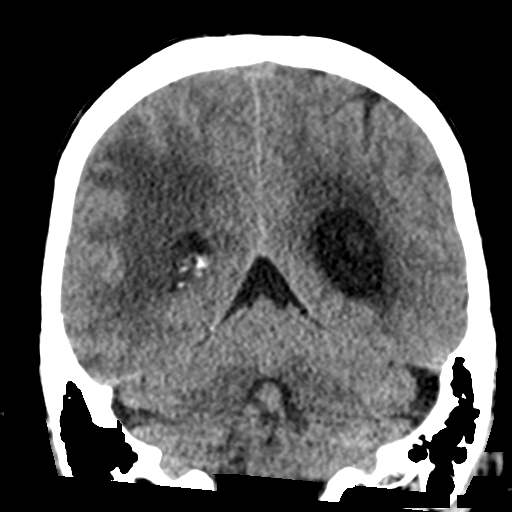
[im 27/60  brain]
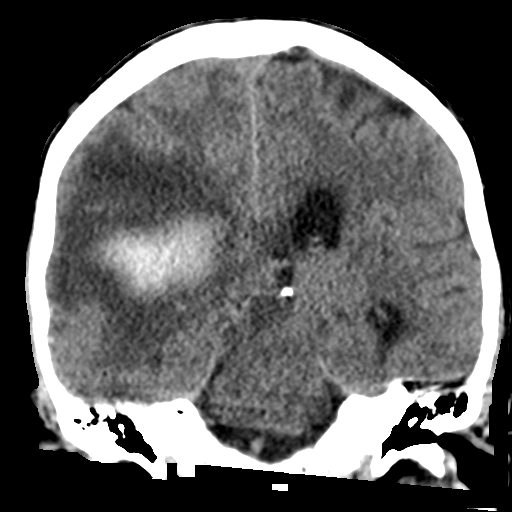
[im 33/60  brain]
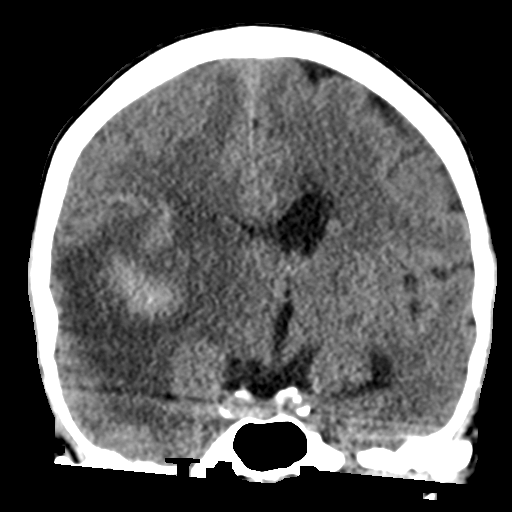

[Series 5: sagittal soft tissue · sagittal · 0.28mm/px · 3 of 48 slices shown]
[im 16/48  brain]
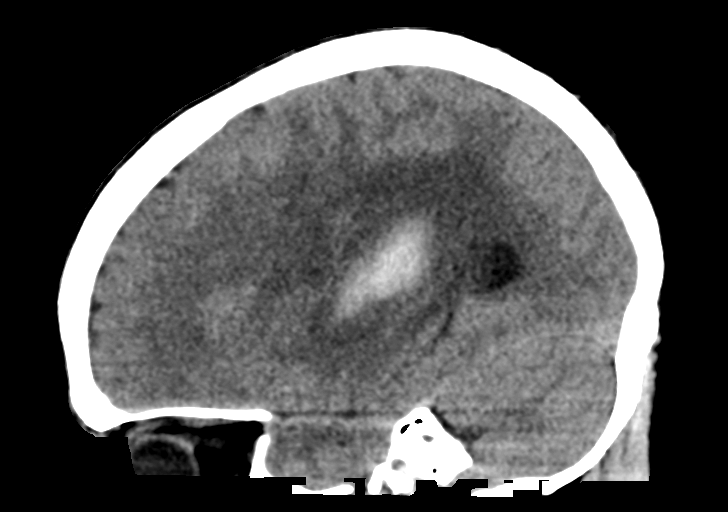
[im 24/48  brain]
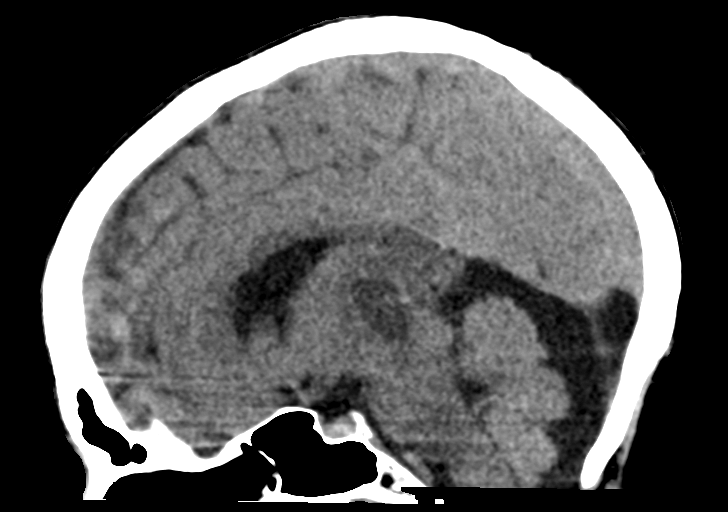
[im 32/48  brain]
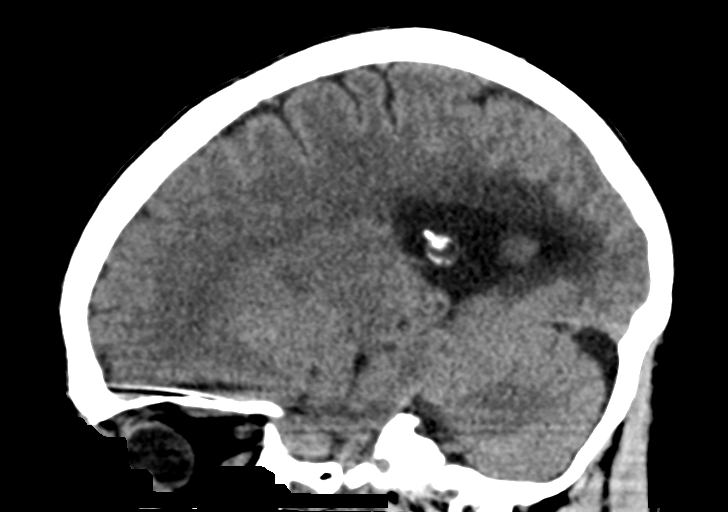

[15 of 47 positions shown; findings below may reference images not displayed]

FINDINGS: CT HEAD FINDINGS

Brain: There is a large intraparenchymal hemorrhage involving the
posterior right frontal lobe extending into the temporal and
measuring approximately 3.6 x 2.7 cm in greatest axial dimensions.
There is moderate amount of surrounding edema with associated mass
effect on the right lateral ventricle and proximally 11 mm right to
left midline shift. There may be extension of a small amount of
blood into the right thalamus.

There is a 1.5 x 1.1 cm ill-defined hypodense area in the right
thalamus which may represent edema or metastatic disease.
Additionally there is some edema surrounding the occipital horn of
the left lateral ventricle. Findings may represent metastatic
disease. Further evaluation with MRI without and with contrast
recommended.

There is mild dilatation of the left lateral ventricle which may
represent a degree of entrapment.

Vascular: No hyperdense vessel or unexpected calcification.

Skull: Normal. Negative for fracture or focal lesion.

Sinuses/Orbits: No acute finding.

Other: None

CT CERVICAL SPINE FINDINGS

Alignment: No acute subluxation. There is reversal of normal
cervical lordosis which may be positional or due to muscle spasm.

Skull base and vertebrae: No acute fracture.

Soft tissues and spinal canal: No prevertebral fluid or swelling. No
visible canal hematoma.

Disc levels: Multilevel degenerative changes with disc space
narrowing and endplate irregularity and spurring.

Upper chest: Emphysema.  Biapical subpleural scarring.

Other: Bilateral carotid bulb calcified plaques.
IMPRESSION: 1. Large intraparenchymal hemorrhage involving the posterior right
frontal lobe with moderate amount of surrounding edema and
associated mass effect on the right lateral ventricle and proximally
11 mm right to left midline shift.
2. Ill-defined hypodense area in the right thalamus may represent
edema or metastatic disease. Further evaluation with MRI without and
with contrast recommended.
3. No acute/traumatic cervical spine pathology. Multilevel
degenerative changes.

These results were called by telephone at the time of interpretation
on [DATE] at [DATE] to Dr. REISUKE, who verbally
acknowledged these results.

## 2021-05-25 IMAGING — CT CT CERVICAL SPINE W/O CM
3 of 5 series · 11 of 33 positions shown, 13 images · non-contrast
Comparison: None.

CLINICAL DATA: Altered mental status.

EXAM:
CT HEAD WITHOUT CONTRAST
CT CERVICAL SPINE WITHOUT CONTRAST
TECHNIQUE: Multidetector CT imaging of the head and cervical spine was
performed following the standard protocol without intravenous
contrast. Multiplanar CT image reconstructions of the cervical spine
were also generated.

[Series 2: ax c spine bone · axial · 0.38mm/px · z∈[+554,+646]mm · 3 of 92 slices shown, 4 images]
[im 23/92  soft-tissue]
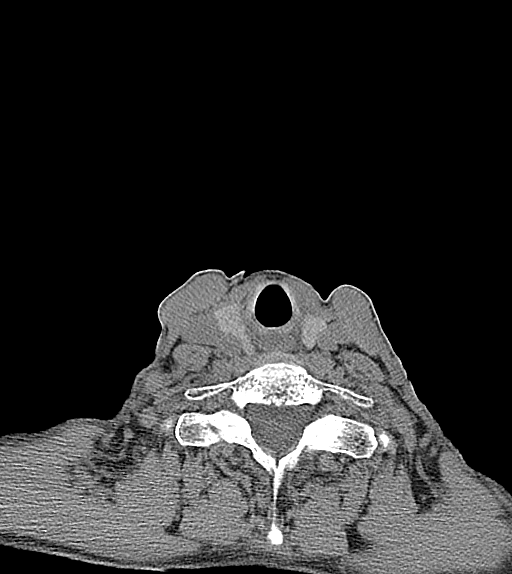
[im 23/92  bone]
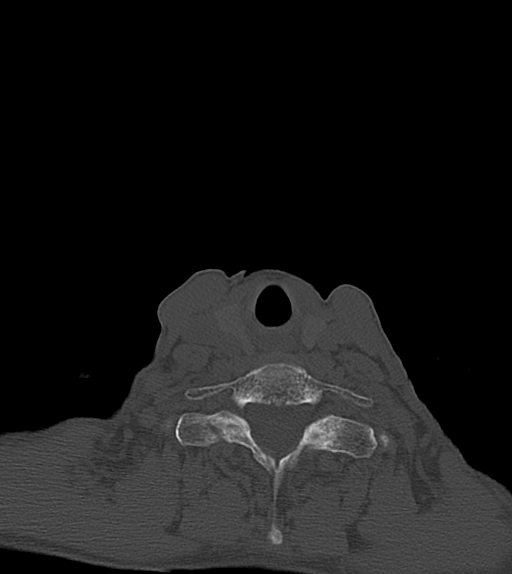
[im 46/92  bone]
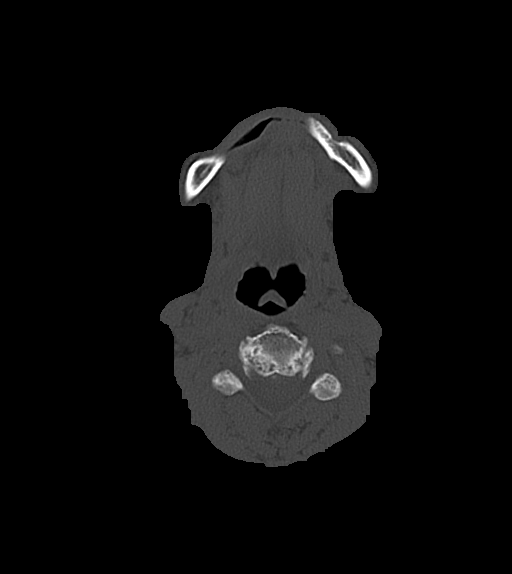
[im 69/92  bone]
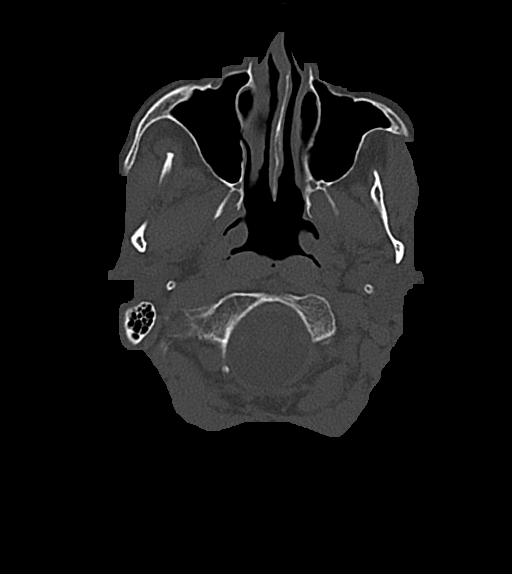

[Series 4: sagittal bone · sagittal · 0.28mm/px · 5 of 57 slices shown, 6 images]
[im 19/57  bone]
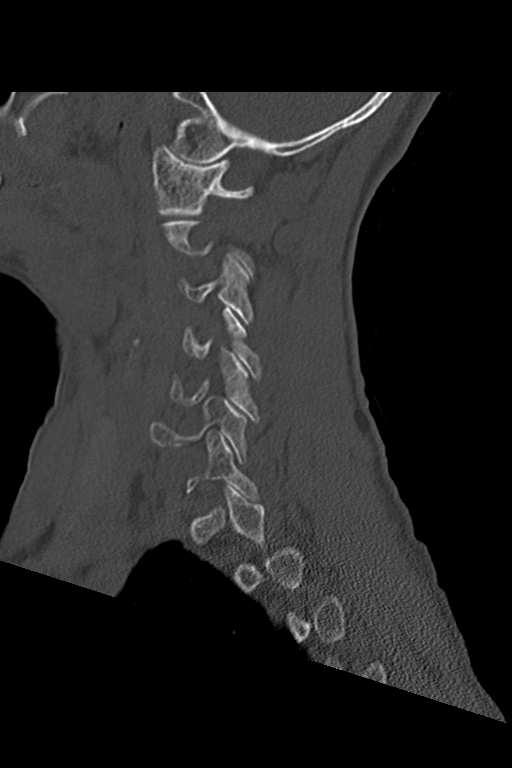
[im 24/57  bone]
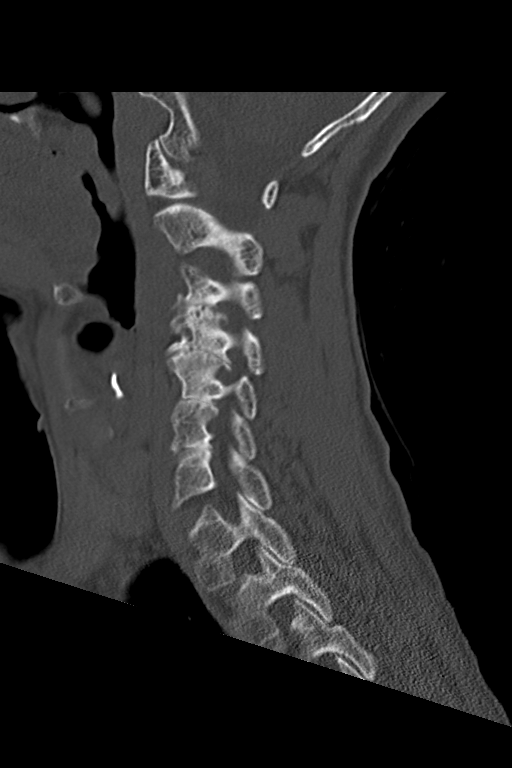
[im 29/57  soft-tissue]
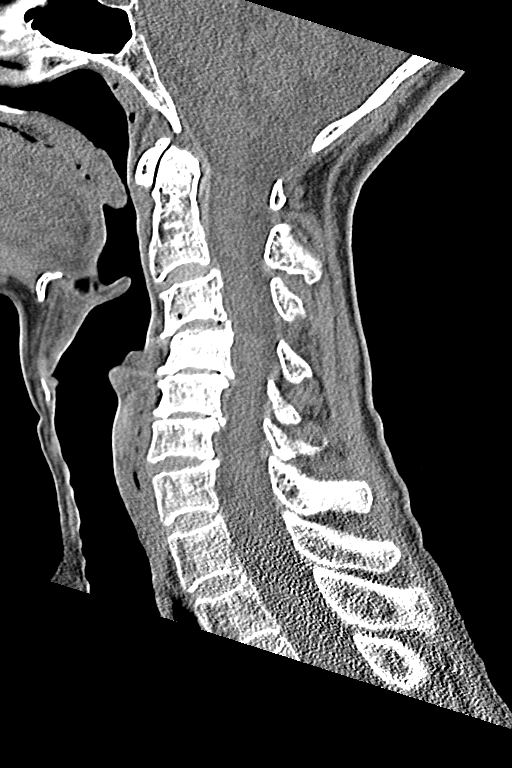
[im 29/57  bone]
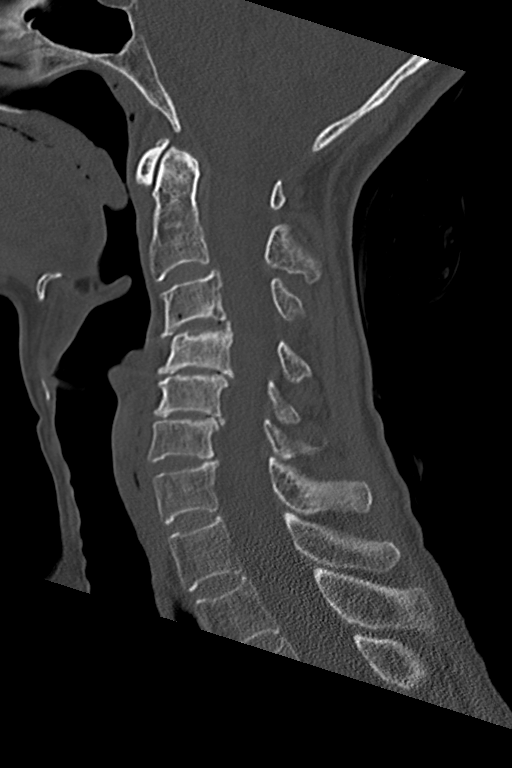
[im 33/57  bone]
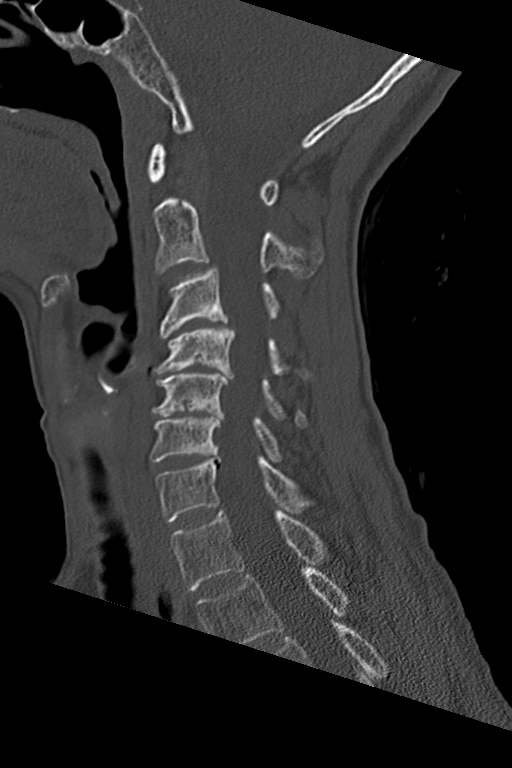
[im 38/57  bone]
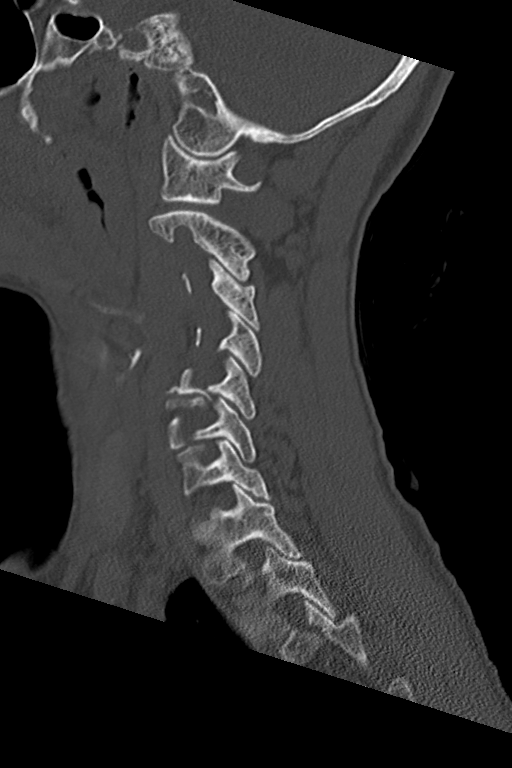

[Series 5: coronal bone · coronal · 0.24mm/px · 3 of 59 slices shown]
[im 12/59  bone]
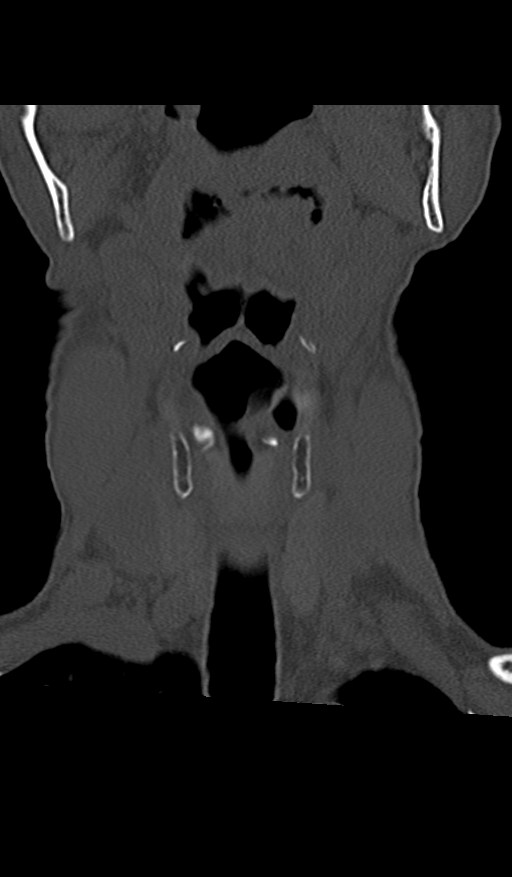
[im 24/59  bone]
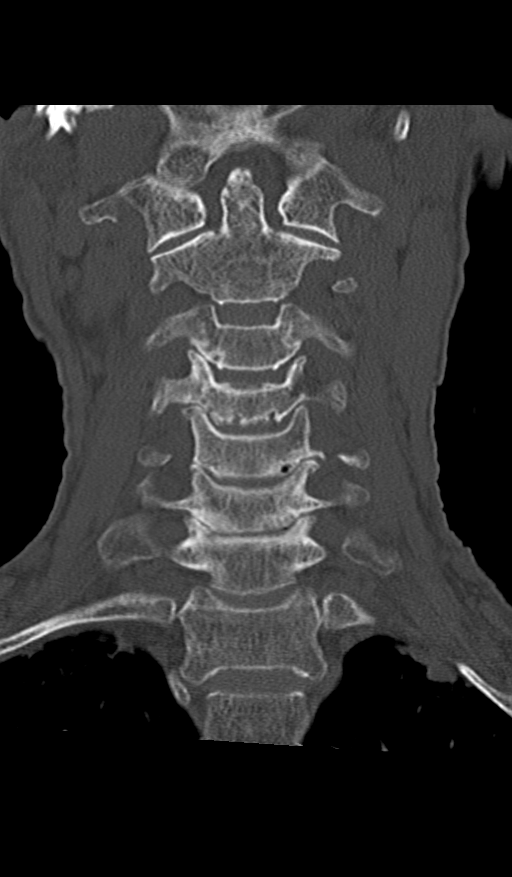
[im 35/59  bone]
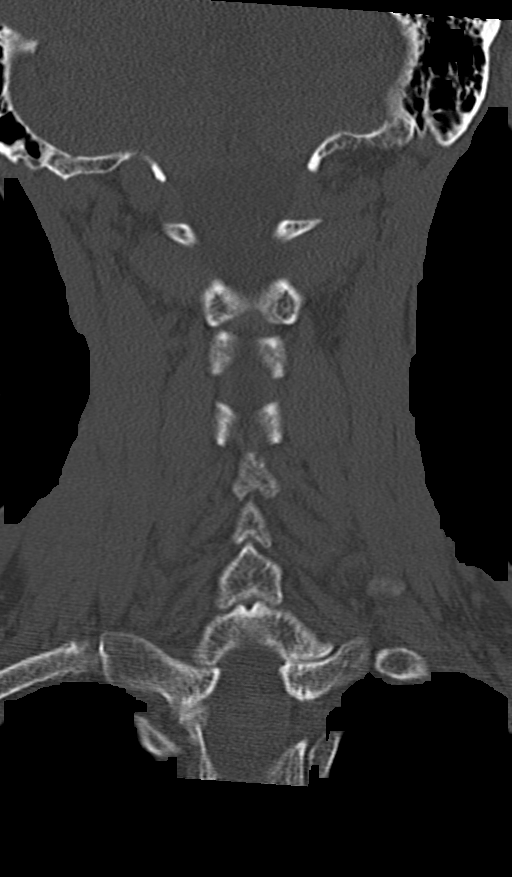

[11 of 33 positions shown; findings below may reference images not displayed]

FINDINGS: CT HEAD FINDINGS

Brain: There is a large intraparenchymal hemorrhage involving the
posterior right frontal lobe extending into the temporal and
measuring approximately 3.6 x 2.7 cm in greatest axial dimensions.
There is moderate amount of surrounding edema with associated mass
effect on the right lateral ventricle and proximally 11 mm right to
left midline shift. There may be extension of a small amount of
blood into the right thalamus.

There is a 1.5 x 1.1 cm ill-defined hypodense area in the right
thalamus which may represent edema or metastatic disease.
Additionally there is some edema surrounding the occipital horn of
the left lateral ventricle. Findings may represent metastatic
disease. Further evaluation with MRI without and with contrast
recommended.

There is mild dilatation of the left lateral ventricle which may
represent a degree of entrapment.

Vascular: No hyperdense vessel or unexpected calcification.

Skull: Normal. Negative for fracture or focal lesion.

Sinuses/Orbits: No acute finding.

Other: None

CT CERVICAL SPINE FINDINGS

Alignment: No acute subluxation. There is reversal of normal
cervical lordosis which may be positional or due to muscle spasm.

Skull base and vertebrae: No acute fracture.

Soft tissues and spinal canal: No prevertebral fluid or swelling. No
visible canal hematoma.

Disc levels: Multilevel degenerative changes with disc space
narrowing and endplate irregularity and spurring.

Upper chest: Emphysema.  Biapical subpleural scarring.

Other: Bilateral carotid bulb calcified plaques.
IMPRESSION: 1. Large intraparenchymal hemorrhage involving the posterior right
frontal lobe with moderate amount of surrounding edema and
associated mass effect on the right lateral ventricle and proximally
11 mm right to left midline shift.
2. Ill-defined hypodense area in the right thalamus may represent
edema or metastatic disease. Further evaluation with MRI without and
with contrast recommended.
3. No acute/traumatic cervical spine pathology. Multilevel
degenerative changes.

These results were called by telephone at the time of interpretation
on [DATE] at [DATE] to Dr. REISUKE, who verbally
acknowledged these results.

## 2021-05-25 IMAGING — MR MR MRA HEAD W/O CM
2 of 4 series · 2 of 48 positions shown · non-contrast
Comparison: Comparison made with prior CT from [DATE].

CLINICAL DATA: Follow-up examination for intracranial hemorrhage.

EXAM:
MRI HEAD WITHOUT CONTRAST
MRA HEAD WITHOUT CONTRAST
TECHNIQUE: Multiplanar, multi-echo pulse sequences of the brain and surrounding
structures were acquired without intravenous contrast. Angiographic
images of the Circle of Willis were acquired using MRA technique
without intravenous contrast.

[Series 1023: post tumble · axial · 0.4mm · 0.21mm/px · 1 of 5 slices shown]
[im 1/5]
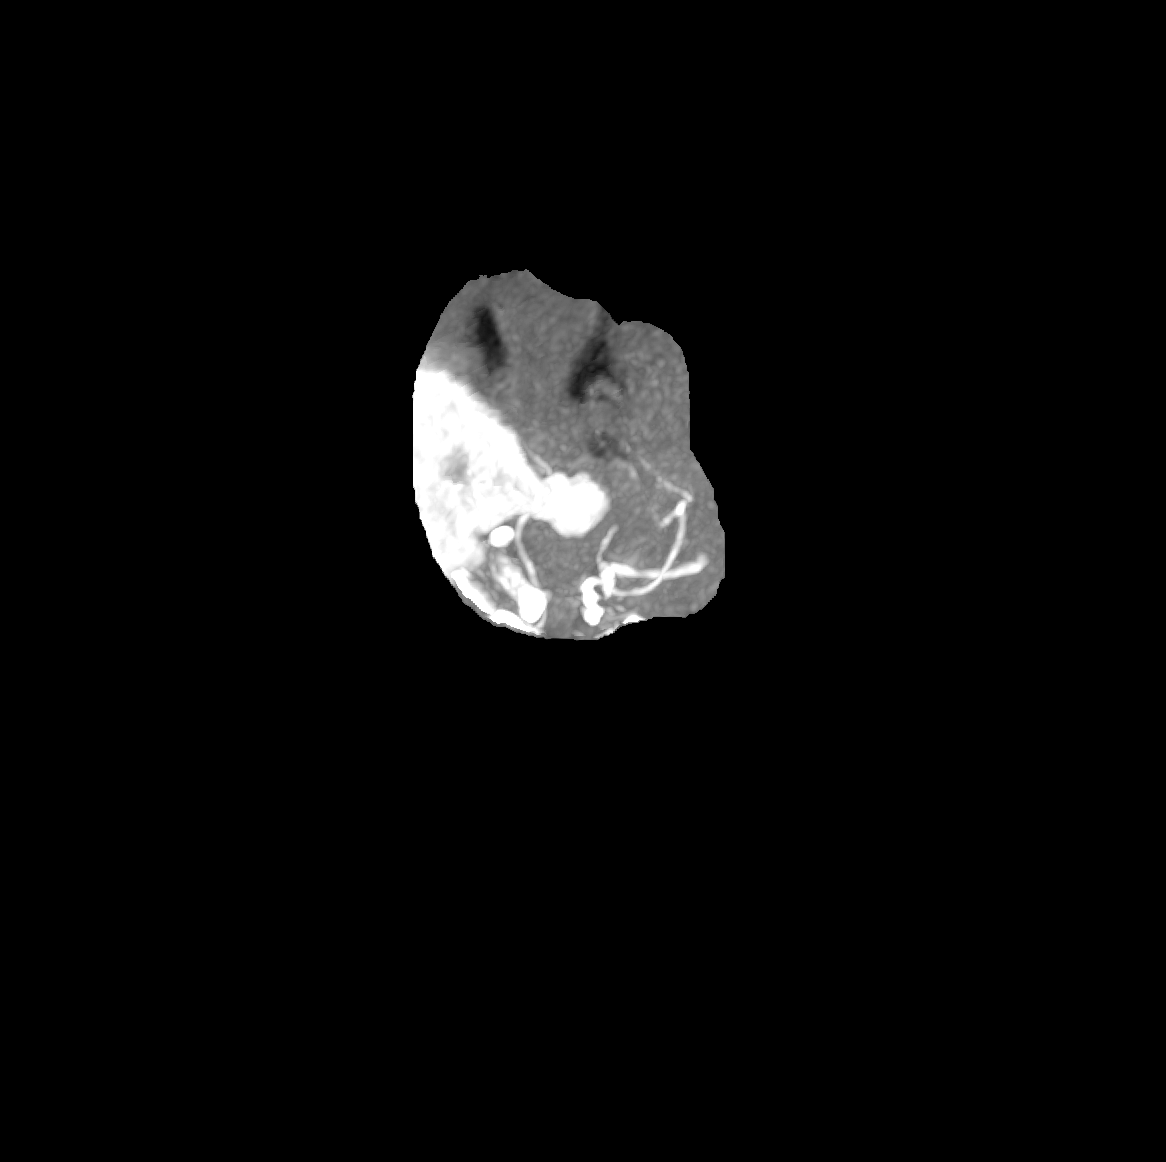

[Series 1027: post rotate · sagittal · 0.4mm · 0.20mm/px · 1 of 3 slices shown]
[im 1/3]
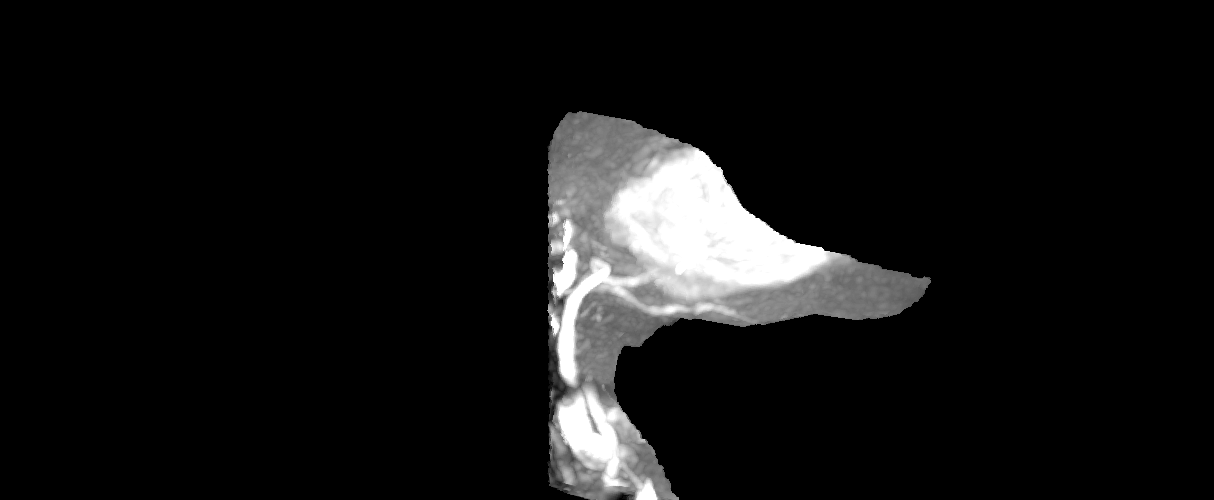

[2 of 48 positions shown; findings below may reference images not displayed]

FINDINGS: MRI HEAD FINDINGS

Brain: Examination moderately to severely degraded by motion
artifact.

Large intraparenchymal hemorrhage centered at the right
frontotemporal region again seen hematoma measures approximately
x 5.3 x 3.4 cm in greatest dimensions (estimated volume 54 mL).
Hematoma extends anteriorly and inferiorly towards the right
temporal pole. Medial extension to involve the right basal ganglia
and thalamus, with extension into the right cerebral peduncle and
right midbrain (series 20, image 13). Scattered small volume
subarachnoid hemorrhage noted within the adjacent posterior
parietotemporal region (series 10, image 5). Frontotemporal region
surrounding vasogenic edema with regional mass effect. Right lateral
ventricle is partially effaced. Associated right-to-left shift
measures up to 5 mm, similar to previous. No visible
intraventricular extension of hemorrhage. Asymmetric dilatation of
the left lateral ventricle with periventricular edema, compatible
with trapping and transependymal flow of CSF. Basilar cisterns are
mildly crowded but remain patent.

Remainder of the brain is otherwise relatively normal in appearance.
No other acute or subacute infarct. No other foci of susceptibility
artifact to suggest acute or chronic intracranial hemorrhage. No
visible mass lesion or extra-axial fluid collection. Pituitary gland
suprasellar region within normal limits.

Vascular: Major intracranial vascular flow voids are maintained at
the skull base.

Skull and upper cervical spine: Craniocervical junction within
normal limits. Bone marrow signal intensity normal. No visible focal
marrow replacing lesion. No scalp soft tissue abnormality.

Sinuses/Orbits: Globes and orbital soft tissues grossly within
normal limits. Paranasal sinuses are largely clear. No significant
mastoid effusion.

Other: None.

MRA HEAD FINDINGS

Anterior circulation: Examination moderately to severely degraded by
motion artifact.

Visualized distal cervical segments of the internal carotid arteries
are patent with antegrade flow. Petrous, cavernous, and supraclinoid
segments patent without visible stenosis or other definite
abnormality. A1 segments patent bilaterally. Grossly normal anterior
communicating artery complex. Anterior cerebral arteries perfused to
their distal aspects without appreciable stenosis. No visible M1
stenosis. Grossly normal MCA bifurcations. Distal MCA branches
perfused and fairly symmetric. Mass effect on the distal right MCA
branches related to the right cerebral hemorrhage noted.

Posterior circulation: Visualized distal V4 segments patent without
stenosis. Left vertebral artery dominant. Neither PICA origin
visualized. Basilar grossly patent to its distal aspect without
stenosis. Superior cerebellar arteries patent bilaterally. Left PCA
supplied via the basilar. Fetal type origin of the right PCA. Both
PCAs grossly patent to their distal aspects without appreciable
stenosis.

Anatomic variants: Fetal type origin of the right PCA.

Other: No visible AVM or other vascular malformation seen underlying
the right cerebral hemorrhage. No appreciable aneurysm on this
motion degraded exam.
IMPRESSION: MRI HEAD IMPRESSION:

1. Motion degraded exam.
2. Large intraparenchymal hemorrhage centered at the right
frontotemporal region, estimated volume 54 mL. Medial extension to
involve the right basal ganglia, thalamus, right cerebral peduncle,
and midbrain. No visible underlying lesion or other structural
abnormality on this motion degraded noncontrast examination. Finding
could reflect a hemorrhagic infarct and/or hemorrhagic
transformation given location and overall morphology.
3. Associated regional mass effect with up to 5 mm of right-to-left
shift, similar to previous. Asymmetric dilatation of the left
lateral ventricle with associated transependymal flow of CSF
consistent with a degree of ventricular trapping.
4. Associated small volume subarachnoid hemorrhage within the
adjacent posterior right parietotemporal region.

MRA HEAD IMPRESSION:

1. Motion degraded exam.
2. Grossly negative intracranial MRA. No visible AVM or other
vascular malformation seen underlying the right cerebral hemorrhage.

## 2021-05-25 IMAGING — MR MR HEAD W/O CM
13 of 15 series · 38 of 48 positions shown · non-contrast
Comparison: Comparison made with prior CT from [DATE].

CLINICAL DATA: Follow-up examination for intracranial hemorrhage.

EXAM:
MRI HEAD WITHOUT CONTRAST
MRA HEAD WITHOUT CONTRAST
TECHNIQUE: Multiplanar, multi-echo pulse sequences of the brain and surrounding
structures were acquired without intravenous contrast. Angiographic
images of the Circle of Willis were acquired using MRA technique
without intravenous contrast.

[Series 5: DWI · axial · 3.0mm · 0.88mm/px · z∈[-75,+80]mm · 6 of 108 slices shown (1 of 4)]
[im 1/108]
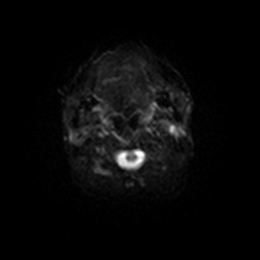
[im 22/108]
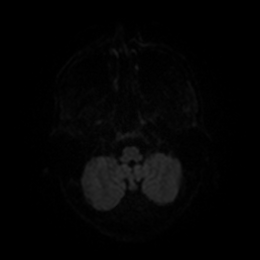
[im 43/108]
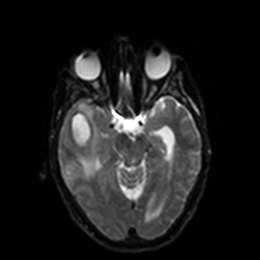
[im 65/108]
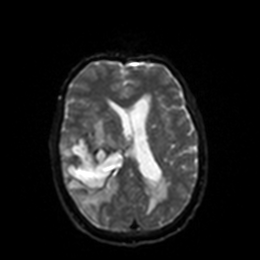
[im 86/108]
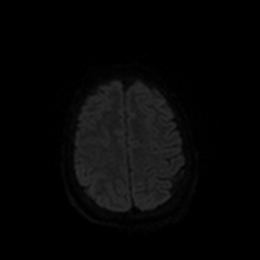
[im 108/108]
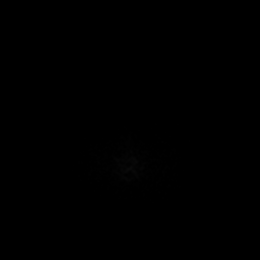

[Series 6: DWI · axial · 3.0mm · 0.88mm/px · z∈[-75,+80]mm · 4 of 52 slices shown (2 of 4)]
[im 1/52]
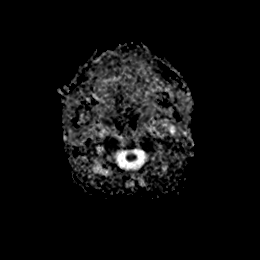
[im 18/52]
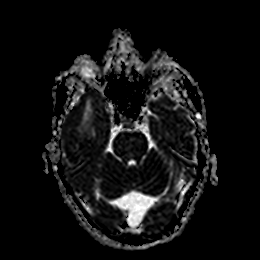
[im 35/52]
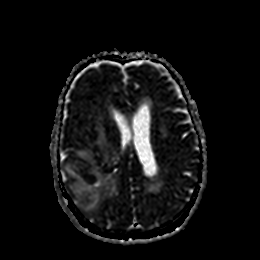
[im 52/52]
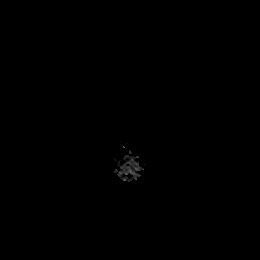

[Series 7: DWI · coronal · 4.0mm · 0.88mm/px · 5 of 76 slices shown (3 of 4)]
[im 1/76]
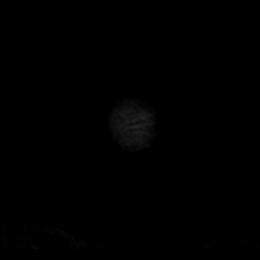
[im 19/76]
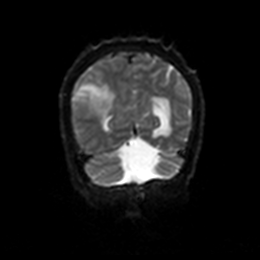
[im 38/76]
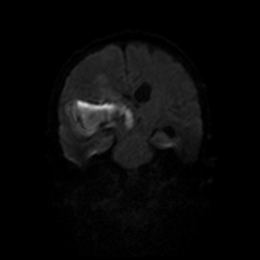
[im 57/76]
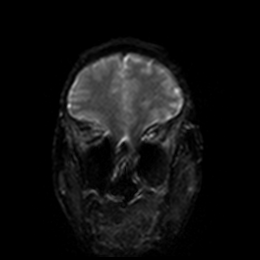
[im 76/76]
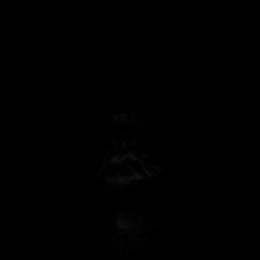

[Series 8: DWI · coronal · 4.0mm · 0.88mm/px · 3 of 38 slices shown (4 of 4)]
[im 1/38]
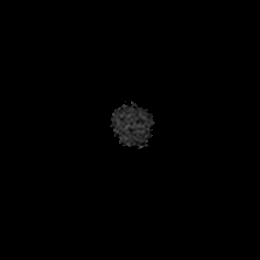
[im 19/38]
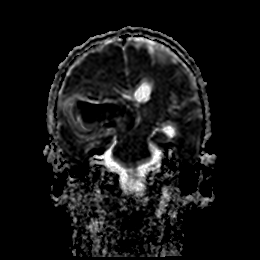
[im 38/38]
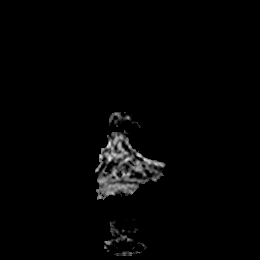

[Series 9: FLAIR · axial · 5.0mm · 0.45mm/px · z∈[-75,+83]mm · 2 of 28 slices shown (1 of 3)]
[im 1/28]
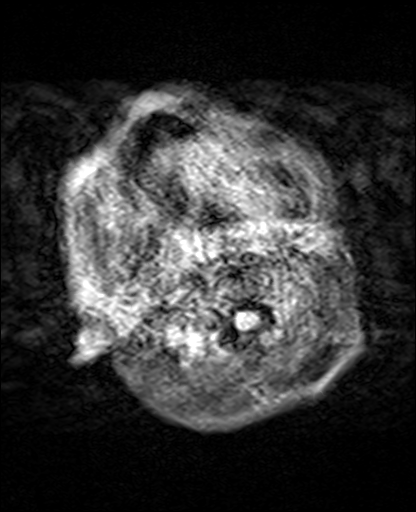
[im 28/28]
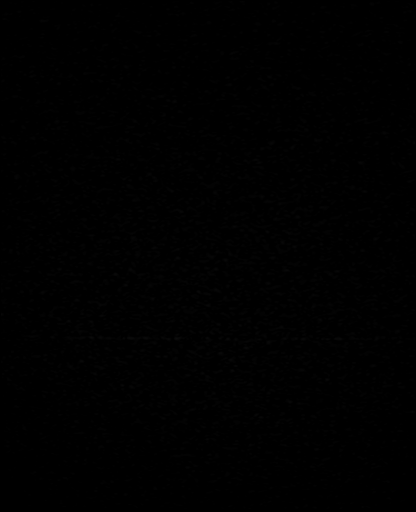

[Series 10: T1 · sagittal · 5.0mm · 0.75mm/px · 2 of 26 slices shown]
[im 1/26]
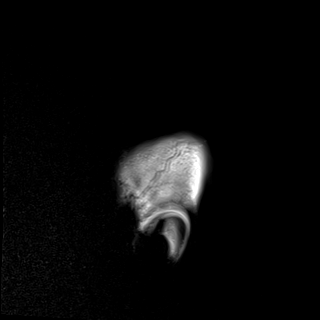
[im 26/26]
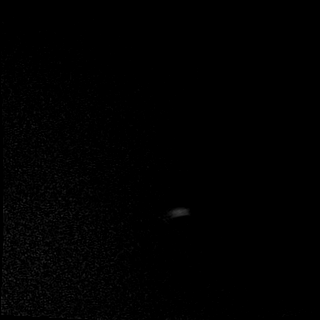

[Series 15: FLAIR · axial · 5.0mm · 0.45mm/px · z∈[-99,+54]mm · 2 of 28 slices shown (2 of 3)]
[im 1/28]
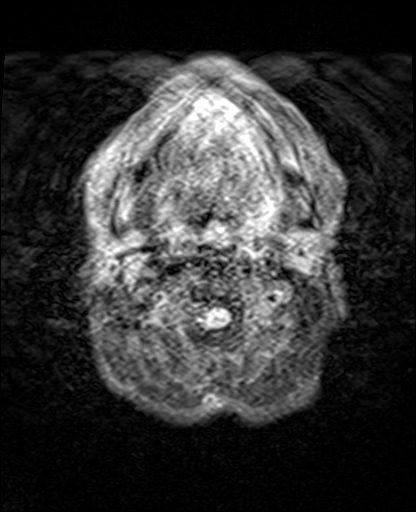
[im 28/28]
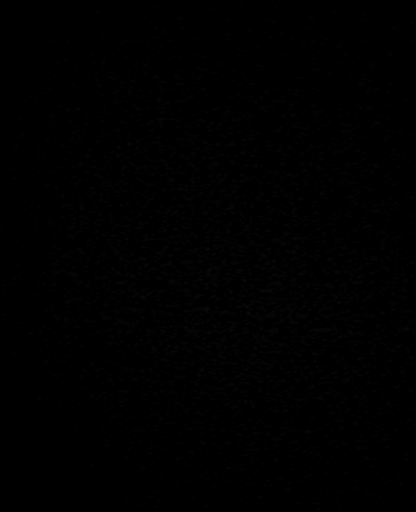

[Series 16: mag_images · axial · 3.0mm · 0.90mm/px · z∈[-101,+55]mm · 4 of 56 slices shown]
[im 1/56]
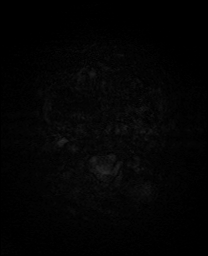
[im 19/56]
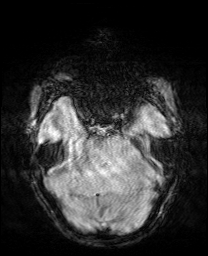
[im 37/56]
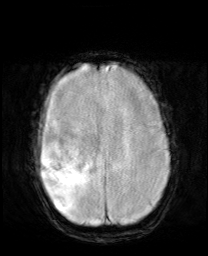
[im 56/56]
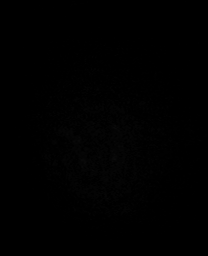

[Series 17: pha_images · axial · 3.0mm · 0.90mm/px · z∈[-101,+44]mm · 3 of 51 slices shown]
[im 1/51]
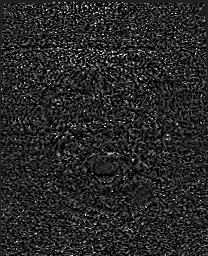
[im 26/51]
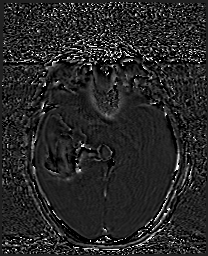
[im 51/51]
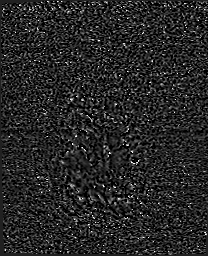

[Series 18: swi_images · axial · 3.0mm · 0.90mm/px · 1 of 56 slices shown]
[im 1/56]
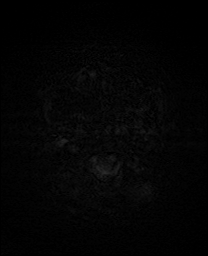

[Series 20: T2 · axial · 5.0mm · 0.72mm/px · z∈[-104,+49]mm · 2 of 28 slices shown (1 of 2)]
[im 1/28]
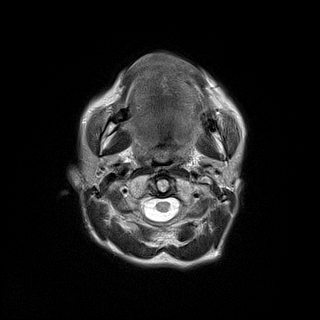
[im 28/28]
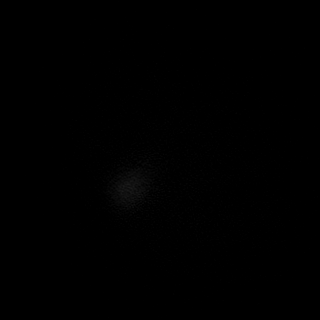

[Series 22: T2 · coronal · 5.0mm · 0.34mm/px · 2 of 32 slices shown (2 of 2)]
[im 1/32]
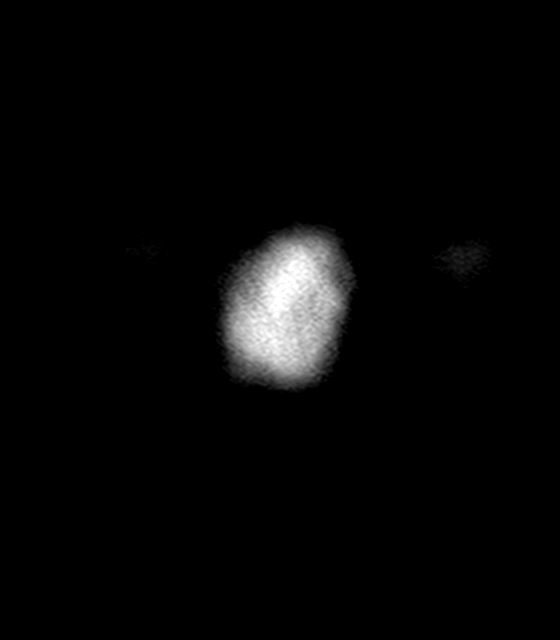
[im 32/32]
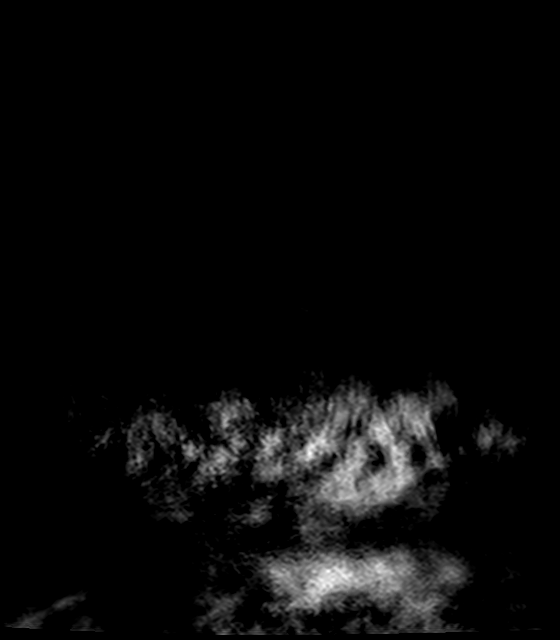

[Series 23: FLAIR · axial · 5.0mm · 0.45mm/px · z∈[-98,+55]mm · 2 of 28 slices shown (3 of 3)]
[im 1/28]
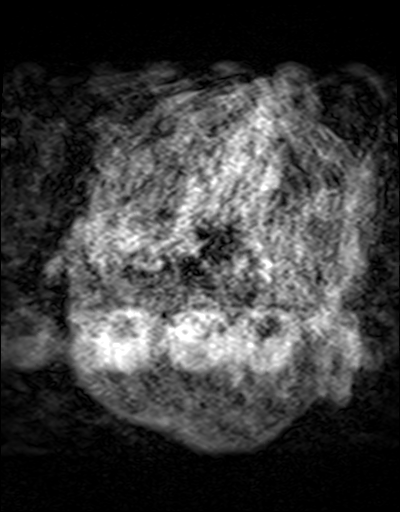
[im 28/28]
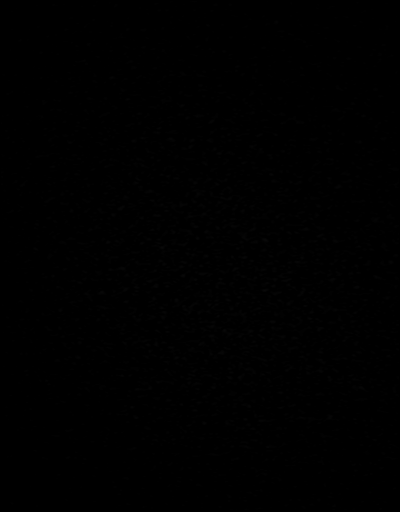

[38 of 48 positions shown; findings below may reference images not displayed]

FINDINGS: MRI HEAD FINDINGS

Brain: Examination moderately to severely degraded by motion
artifact.

Large intraparenchymal hemorrhage centered at the right
frontotemporal region again seen hematoma measures approximately
x 5.3 x 3.4 cm in greatest dimensions (estimated volume 54 mL).
Hematoma extends anteriorly and inferiorly towards the right
temporal pole. Medial extension to involve the right basal ganglia
and thalamus, with extension into the right cerebral peduncle and
right midbrain (series 20, image 13). Scattered small volume
subarachnoid hemorrhage noted within the adjacent posterior
parietotemporal region (series 10, image 5). Frontotemporal region
surrounding vasogenic edema with regional mass effect. Right lateral
ventricle is partially effaced. Associated right-to-left shift
measures up to 5 mm, similar to previous. No visible
intraventricular extension of hemorrhage. Asymmetric dilatation of
the left lateral ventricle with periventricular edema, compatible
with trapping and transependymal flow of CSF. Basilar cisterns are
mildly crowded but remain patent.

Remainder of the brain is otherwise relatively normal in appearance.
No other acute or subacute infarct. No other foci of susceptibility
artifact to suggest acute or chronic intracranial hemorrhage. No
visible mass lesion or extra-axial fluid collection. Pituitary gland
suprasellar region within normal limits.

Vascular: Major intracranial vascular flow voids are maintained at
the skull base.

Skull and upper cervical spine: Craniocervical junction within
normal limits. Bone marrow signal intensity normal. No visible focal
marrow replacing lesion. No scalp soft tissue abnormality.

Sinuses/Orbits: Globes and orbital soft tissues grossly within
normal limits. Paranasal sinuses are largely clear. No significant
mastoid effusion.

Other: None.

MRA HEAD FINDINGS

Anterior circulation: Examination moderately to severely degraded by
motion artifact.

Visualized distal cervical segments of the internal carotid arteries
are patent with antegrade flow. Petrous, cavernous, and supraclinoid
segments patent without visible stenosis or other definite
abnormality. A1 segments patent bilaterally. Grossly normal anterior
communicating artery complex. Anterior cerebral arteries perfused to
their distal aspects without appreciable stenosis. No visible M1
stenosis. Grossly normal MCA bifurcations. Distal MCA branches
perfused and fairly symmetric. Mass effect on the distal right MCA
branches related to the right cerebral hemorrhage noted.

Posterior circulation: Visualized distal V4 segments patent without
stenosis. Left vertebral artery dominant. Neither PICA origin
visualized. Basilar grossly patent to its distal aspect without
stenosis. Superior cerebellar arteries patent bilaterally. Left PCA
supplied via the basilar. Fetal type origin of the right PCA. Both
PCAs grossly patent to their distal aspects without appreciable
stenosis.

Anatomic variants: Fetal type origin of the right PCA.

Other: No visible AVM or other vascular malformation seen underlying
the right cerebral hemorrhage. No appreciable aneurysm on this
motion degraded exam.
IMPRESSION: MRI HEAD IMPRESSION:

1. Motion degraded exam.
2. Large intraparenchymal hemorrhage centered at the right
frontotemporal region, estimated volume 54 mL. Medial extension to
involve the right basal ganglia, thalamus, right cerebral peduncle,
and midbrain. No visible underlying lesion or other structural
abnormality on this motion degraded noncontrast examination. Finding
could reflect a hemorrhagic infarct and/or hemorrhagic
transformation given location and overall morphology.
3. Associated regional mass effect with up to 5 mm of right-to-left
shift, similar to previous. Asymmetric dilatation of the left
lateral ventricle with associated transependymal flow of CSF
consistent with a degree of ventricular trapping.
4. Associated small volume subarachnoid hemorrhage within the
adjacent posterior right parietotemporal region.

MRA HEAD IMPRESSION:

1. Motion degraded exam.
2. Grossly negative intracranial MRA. No visible AVM or other
vascular malformation seen underlying the right cerebral hemorrhage.

## 2021-05-25 MED ORDER — DIPHENHYDRAMINE HCL 50 MG/ML IJ SOLN
25.0000 mg | Freq: Once | INTRAMUSCULAR | Status: AC | PRN
  Administered 2021-05-25: 23:00:00 25 mg via INTRAVENOUS
  Filled 2021-05-25: qty 1

## 2021-05-25 MED ORDER — PANTOPRAZOLE SODIUM 40 MG IV SOLR
40.0000 mg | Freq: Every day | INTRAVENOUS | Status: DC
  Administered 2021-05-26: 23:00:00 40 mg via INTRAVENOUS
  Filled 2021-05-25: qty 40

## 2021-05-25 MED ORDER — ACETAMINOPHEN 650 MG RE SUPP: 650.0000 mg | RECTAL | Status: DC | PRN

## 2021-05-25 MED ORDER — SENNOSIDES-DOCUSATE SODIUM 8.6-50 MG PO TABS
1.0000 | ORAL_TABLET | Freq: Two times a day (BID) | ORAL | Status: DC
  Administered 2021-05-26 – 2021-05-29 (×5): 1 via ORAL
  Filled 2021-05-25 (×6): qty 1

## 2021-05-25 MED ORDER — ACETAMINOPHEN 500 MG PO TABS
1000.0000 mg | ORAL_TABLET | Freq: Once | ORAL | Status: AC
  Administered 2021-05-25: 19:00:00 1000 mg via ORAL
  Filled 2021-05-25: qty 2

## 2021-05-25 MED ORDER — LEVETIRACETAM IN NACL 500 MG/100ML IV SOLN
500.0000 mg | Freq: Once | INTRAVENOUS | Status: DC
  Filled 2021-05-25: qty 100

## 2021-05-25 MED ORDER — ACETAMINOPHEN 160 MG/5ML PO SOLN: 650.0000 mg | ORAL | Status: DC | PRN

## 2021-05-25 MED ORDER — ACETAMINOPHEN 325 MG PO TABS
650.0000 mg | ORAL_TABLET | ORAL | Status: DC | PRN
  Administered 2021-05-26 – 2021-05-29 (×9): 650 mg via ORAL
  Filled 2021-05-25 (×10): qty 2

## 2021-05-25 MED ORDER — STROKE: EARLY STAGES OF RECOVERY BOOK: Freq: Once | Status: DC

## 2021-05-25 MED ORDER — CHLORHEXIDINE GLUCONATE CLOTH 2 % EX PADS
6.0000 | MEDICATED_PAD | Freq: Every day | CUTANEOUS | Status: DC
  Administered 2021-05-27 – 2021-05-28 (×2): 6 via TOPICAL

## 2021-05-25 NOTE — ED Triage Notes (Signed)
Patients family reports she had stroke on December 2 and was admitted to the hospital from 05/07/2021-05/23/2021 in [place]. Family reports she cannot ambulate since stroke. Patient is slumped over in wheelchair. Rehabilitation denied patient in [place] per patient. Patient states "I feel terrible and achy all over" Family reports she had unwitnessed fall from chair today. Patient alert to self. Disoriented to time and place (state).

## 2021-05-25 NOTE — Progress Notes (Signed)
Neurology telephone note  Contacted by Dr. Antoine Primas about this 60 yo patient who presented to [facility] this evening. Per EDP, hx is difficult to obtain from patient, collateral provided by daughter. Apparently she was admitted to a hospital in [place] for [facility] 12/2-/05/13/21. Daughter was concerned they discharged patient too early and did not know the workup that was performed there. Currently we have no records, although ED secretary is working on this. At hospital discharge patient was weak in LUE and LLE, but had some movement. 1-2 days ago while driving through [place] she became weaker on the L than before. She did not seek medical attention at that time. Today she had worsening headache and fell out of her recliner. Per EDP, awake but not able to provide detailed hx. L sided hemiplegia. Not on anticoagulation. SBP 90s since arrival.   Head CT wo contrast in ED:  Brain: There is a large intraparenchymal hemorrhage involving the posterior right frontal lobe extending into the temporal and measuring approximately 3.6 x 2.7 cm in greatest axial dimensions. There is moderate amount of surrounding edema with associated mass effect on the right lateral ventricle and proximally 11 mm right to left midline shift. There may be extension of a small amount of blood into the right thalamus.   There is a 1.5 x 1.1 cm ill-defined hypodense area in the right thalamus which may represent edema or metastatic disease. Additionally there is some edema surrounding the occipital horn of the left lateral ventricle. Findings may represent metastatic disease. Further evaluation with MRI without and with contrast recommended.  Imaging personally reviewed, I agree with above interpretation  Recommendations:  - I arranged transfer to [facility] from [facility] through [facility]. Neuro ICU has available bed. [facility] will send transport. Accepting MD is Dr. Derry Lory.  - Goal SBP <150. She is currently in  90s systolic asymptomatic. If she becomes more hypotensive, give gentle fluid resuscitation. If she becames hypertensive over SBP 150 start clevidipine gtt. - SCDs for DVT prophylaxis - Head CT wo contrast in 6 hrs assess stability - Not on anticoagulation, nothing to reverse - HOB elevated 30 degrees - Will need MRI brain wwo at [facility] for further evaluation R thalamic hypodensity - Further mgmt per Admitting team at [facility].  Bing Neighbors, MD [facility] [phone]  If 7pm- 7am, please page neurology on call as listed in AMION.

## 2021-05-25 NOTE — Progress Notes (Signed)
Neurosurgery contacted by ED regarding the patient presenting to ED tonight with weakness on the left side. Per history, she was hopsitalized earlier this month in [place] and has now completed a road trip after discharge from there after unknown treatment. 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 Patient will need MRI for further characterization.   At this time, no surgical intervention indicated given stability but do recommend repeat CT in 6 hours to confirm no expansion of hemorrhage. She will need strict BP management and can start Keppra for seizure prophylaxis.   Agree with neurology for stroke management. If MRI or CT shows any worsening features on other concerns, we are available.

## 2021-05-25 NOTE — ED Provider Notes (Signed)
°  Emergency Medicine Provider Triage Evaluation Note  Monica Miranda , a 60 y.o.female,  was evaluated in triage.  Pt complains of weakness.  Patient states that she had a stroke on December 2 and was admitted to the hospital from 05/07/2021 until 05/23/2021 in [place].  Since the stroke, she has lost most functioning on the left side in her upper and lower extremities.  She is joined by her family, who states that the patient still continues to not feel well.  Additionally reports an unwitnessed fall.  Patient is currently endorsing headache and right-sided rib pain.  Denies fever/chills, cough, shortness of breath, or urinary symptoms.   Review of Systems  Positive: Rib pain, weakness, headache Negative: Denies fever, chest pain, vomiting  Physical Exam  There were no vitals filed for this visit. Gen:   Awake, appears uncomfortable. Resp:  Normal effort  MSK:   Moves extremities without difficulty  Other:    Medical Decision Making  Given the patient's initial medical screening exam, the following diagnostic evaluation has been ordered. The patient will be placed in the appropriate treatment space, once one is available, to complete the evaluation and treatment. I have discussed the plan of care with the patient and I have advised the patient that an ED physician or mid-level practitioner will reevaluate their condition after the test results have been received, as the results may give them additional insight into the type of treatment they may need.    Diagnostics: Labs, EKG, head CT, neck CT, CXR.  Treatments: none immediately   Varney Daily, [place] 05/25/21 1641    Gilles Chiquito, MD 05/25/21 [phone]

## 2021-05-25 NOTE — ED Provider Notes (Signed)
[facility] Emergency Department Provider Note  ____________________________________________   Event Date/Time   First MD Initiated Contact with Patient 05/25/21 1725     (approximate)  I have reviewed the triage vital signs and the nursing notes.   HISTORY  Chief Complaint Altered Mental Status   HPI Monica Miranda is a 60 y.o. female with a past medical history of COPD and polysubstance abuse including cocaine amphetamines presents accompanied by daughter-in-law for assessment after a fall out of a chair that occurred earlier today.  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  Daughter-in-law is not sure why she was released as at that time she had significant deficits including inability to move her left arm or left leg as well as no sensation in her left arm or left leg.  It seems that she drove here and on the way while in [place] yesterday had worsening of her headache and then no one is called but the patient was not taken to the ED at that time.  Patient did eventually make it to [place] and today had an unwitnessed fall out of her recliner.  Patient states it is 2021 and that she hurts everywhere and is not able to further localize her pain or provide any additional details regarding events leading to her recent hospitalization or since discharge other than endorsing that she think she may have fallen today.  She denies any fevers, cough, vomiting, diarrhea, rash or burning with urination but unclear how reliable this is given she thinks it is 2021.         Past Medical History:  Diagnosis Date   COPD (chronic obstructive pulmonary disease) (HCC)    Rib fracture    4 months ago   Stroke [facility])     There are no problems to display for this patient.   Past Surgical History:   Procedure Laterality Date   ESOPHAGEAL DILATION      Prior to Admission medications   Medication Sig Start Date End Date Taking? Authorizing Provider  albuterol (PROVENTIL HFA;VENTOLIN HFA) 108 (90 BASE) MCG/ACT inhaler Inhale 1-2 puffs into the lungs every 6 (six) hours as needed for wheezing. 03/21/13   Isaias Sakai, NP  albuterol (PROVENTIL HFA;VENTOLIN HFA) 108 (90 BASE) MCG/ACT inhaler Inhale 1-2 puffs into the lungs every 6 (six) hours as needed for wheezing or shortness of breath. 09/16/13   Toy Cookey, MD  amoxicillin (AMOXIL) 500 MG capsule Take 1 capsule (500 mg total) by mouth 3 (three) times daily. 07/12/16   Arthor Captain, PA-C  cyanocobalamin 500 MCG tablet Take 500 mcg by mouth daily.    [provider]  doxycycline (VIBRAMYCIN) 100 MG capsule Take 1 capsule (100 mg total) by mouth 2 (two) times daily. One po bid x 7 days 09/16/13   Toy Cookey, MD  Multiple Vitamins-Minerals (MULTIVITAMINS THER. W/MINERALS) TABS Take 1 tablet by mouth daily.      [provider]  ondansetron (ZOFRAN) 4 MG tablet Take 1 tablet (4 mg total) by mouth every 8 (eight) hours as needed for nausea or vomiting. 02/01/16   Arthor Captain, PA-C  oxyCODONE-acetaminophen (PERCOCET) 5-325 MG tablet Take 1-2 tablets by mouth every 4 (four) hours as needed. 02/01/16   Arthor Captain, PA-C  traMADol (ULTRAM) 50 MG tablet Take 1 tablet (50 mg total) by mouth every  6 (six) hours as needed. 07/12/16   Arthor Captain, PA-C    Allergies Codeine  History reviewed. No pertinent family history.  Social History Social History   Tobacco Use   Smoking status: Every Day    Packs/day: 0.50    Types: Cigarettes   Smokeless tobacco: Never  Vaping Use   Vaping Use: Never used  Substance Use Topics   Alcohol use: Yes    Alcohol/week: 1.0 standard drink    Types: 1 Cans of beer per week   Drug use: Yes    Types: Marijuana, Cocaine    Comment: meth- reports last use was before stroke     Review of Systems  Review of Systems  Unable to perform ROS: Mental status change  Musculoskeletal:  Positive for myalgias.  Neurological:  Positive for headaches.     ____________________________________________   PHYSICAL EXAM:  VITAL SIGNS: ED Triage Vitals  Enc Vitals Group     BP 05/25/21 1644 98/76     Pulse Rate 05/25/21 1644 (!) 101     Resp 05/25/21 1644 16     Temp 05/25/21 1644 98.9 F (37.2 C)     Temp Source 05/25/21 1644 Oral     SpO2 05/25/21 1644 97 %     Weight 05/25/21 1638 110 lb (49.9 kg)     Height 05/25/21 1638 5\' 1"  (1.549 m)     Head Circumference --      Peak Flow --      Pain Score 05/25/21 1637 10     Pain Loc --      Pain Edu? --      Excl. in GC? --    Vitals:   05/25/21 1745 05/25/21 1753  BP: 92/63   Pulse:  87  Resp:  20  Temp:    SpO2:  100%   Physical Exam Vitals and nursing note reviewed.  Constitutional:      Appearance: She is well-developed. She is ill-appearing.  HENT:     Head: Normocephalic and atraumatic.     Right Ear: External ear normal.     Left Ear: External ear normal.     Nose: Nose normal.     Mouth/Throat:     Mouth: Mucous membranes are dry.  Eyes:     Conjunctiva/sclera: Conjunctivae normal.  Cardiovascular:     Rate and Rhythm: Normal rate and regular rhythm.     Heart sounds: No murmur heard. Pulmonary:     Effort: Pulmonary effort is normal. No respiratory distress.     Breath sounds: Normal breath sounds.  Abdominal:     Palpations: Abdomen is soft.     Tenderness: There is no abdominal tenderness.  Musculoskeletal:        General: No swelling.     Cervical back: Neck supple.  Skin:    General: Skin is warm and dry.     Capillary Refill: Capillary refill takes 2 to 3 seconds.  Neurological:     Mental Status: She is alert.     Cranial Nerves: Cranial nerve deficit present.  Psychiatric:        Mood and Affect: Mood normal.    Patient is unable to move her left upper extremity or  left lower extremity on command.  She does not seem to have any sensation to light touch in the left upper or lower extremities.  She is able to move her right upper and lower extremities on command and sensation is intact to light touch  in right hemibody.  Patient seems to have left-sided visual field deficit but otherwise cranial nerves II through XII are grossly intact.  There is no specific point tenderness over the C/T/L-spine or obvious significant trauma on exam of the scalp.  Oropharynx is unremarkable other than appearing little dry.  Abdomen is soft.  No other obvious trauma on exam. ____________________________________________   LABS (all labs ordered are listed, but only abnormal results are displayed)  Labs Reviewed  CBC WITH DIFFERENTIAL/PLATELET - Abnormal; Notable for the following components:      Result Value   RBC 5.12 (*)    Eosinophils Absolute 0.8 (*)    All other components within normal limits  COMPREHENSIVE METABOLIC PANEL - Abnormal; Notable for the following components:   Total Protein 6.0 (*)    Alkaline Phosphatase 138 (*)    All other components within normal limits  RESP PANEL BY RT-PCR (FLU A&B, COVID) ARPGX2  PROTIME-INR  TSH  URINALYSIS, COMPLETE (UACMP) WITH MICROSCOPIC  URINE DRUG SCREEN, QUALITATIVE ([facility] ONLY)  ETHANOL  TROPONIN I (HIGH SENSITIVITY)   ____________________________________________  EKG  ECG remarkable for sinus rhythm with a ventricular rate of 95, normal axis, unremarkable intervals with multiple nonspecific changes in inferior leads without other clear evidence of acute ischemia or significant arrhythmia. ____________________________________________  RADIOLOGY  ED MD interpretation:   CT head shows a large intraparenchymal hemorrhage involving the posterior right frontal lobe with moderate amount of surrounding edema and associated mass-effect on the right lateral ventricle approximately 11 mm right to left midline shift.   There is also an ill-defined hypodensity in the right thalamus possibly representing edema or metastatic disease.  CT C-spine shows no acute C-spine fracture or traumatic subluxation although is some degenerative changes noted.  Chest x-ray shows no focal consolidation, pneumothorax, rib fracture, effusion, edema, or other acute thoracic process.  Pelvis x-ray shows no acute fracture dislocation although possibly some constipation.   Official radiology report(s): CT Head Wo Contrast  Result Date: 05/25/2021 CLINICAL DATA:  Altered mental status. EXAM: CT HEAD WITHOUT CONTRAST CT CERVICAL SPINE WITHOUT CONTRAST TECHNIQUE: Multidetector CT imaging of the head and cervical spine was performed following the standard protocol without intravenous contrast. Multiplanar CT image reconstructions of the cervical spine were also generated. COMPARISON:  None. FINDINGS: CT HEAD FINDINGS Brain: There is a large intraparenchymal hemorrhage involving the posterior right frontal lobe extending into the temporal and measuring approximately 3.6 x 2.7 cm in greatest axial dimensions. There is moderate amount of surrounding edema with associated mass effect on the right lateral ventricle and proximally 11 mm right to left midline shift. There may be extension of a small amount of blood into the right thalamus. There is a 1.5 x 1.1 cm ill-defined hypodense area in the right thalamus which may represent edema or metastatic disease. Additionally there is some edema surrounding the occipital horn of the left lateral ventricle. Findings may represent metastatic disease. Further evaluation with MRI without and with contrast recommended. There is mild dilatation of the left lateral ventricle which may represent a degree of entrapment. Vascular: No hyperdense vessel or unexpected calcification. Skull: Normal. Negative for fracture or focal lesion. Sinuses/Orbits: No acute finding. Other: None CT CERVICAL SPINE FINDINGS Alignment: No  acute subluxation. There is reversal of normal cervical lordosis which may be positional or due to muscle spasm. Skull base and vertebrae: No acute fracture. Soft tissues and spinal canal: No prevertebral fluid or swelling. No visible canal hematoma. Disc levels: Multilevel degenerative changes  with disc space narrowing and endplate irregularity and spurring. Upper chest: Emphysema.  Biapical subpleural scarring. Other: Bilateral carotid bulb calcified plaques. IMPRESSION: 1. Large intraparenchymal hemorrhage involving the posterior right frontal lobe with moderate amount of surrounding edema and associated mass effect on the right lateral ventricle and proximally 11 mm right to left midline shift. 2. Ill-defined hypodense area in the right thalamus may represent edema or metastatic disease. Further evaluation with MRI without and with contrast recommended. 3. No acute/traumatic cervical spine pathology. Multilevel degenerative changes. These results were called by telephone at the time of interpretation on 05/25/2021 at 5:30 pm to Dr. Lenard Lance, who verbally acknowledged these results. Electronically Signed   By: Elgie Collard M.D.   On: 05/25/2021 17:54   CT Cervical Spine Wo Contrast  Result Date: 05/25/2021 CLINICAL DATA:  Altered mental status. EXAM: CT HEAD WITHOUT CONTRAST CT CERVICAL SPINE WITHOUT CONTRAST TECHNIQUE: Multidetector CT imaging of the head and cervical spine was performed following the standard protocol without intravenous contrast. Multiplanar CT image reconstructions of the cervical spine were also generated. COMPARISON:  None. FINDINGS: CT HEAD FINDINGS Brain: There is a large intraparenchymal hemorrhage involving the posterior right frontal lobe extending into the temporal and measuring approximately 3.6 x 2.7 cm in greatest axial dimensions. There is moderate amount of surrounding edema with associated mass effect on the right lateral ventricle and proximally 11 mm right to left  midline shift. There may be extension of a small amount of blood into the right thalamus. There is a 1.5 x 1.1 cm ill-defined hypodense area in the right thalamus which may represent edema or metastatic disease. Additionally there is some edema surrounding the occipital horn of the left lateral ventricle. Findings may represent metastatic disease. Further evaluation with MRI without and with contrast recommended. There is mild dilatation of the left lateral ventricle which may represent a degree of entrapment. Vascular: No hyperdense vessel or unexpected calcification. Skull: Normal. Negative for fracture or focal lesion. Sinuses/Orbits: No acute finding. Other: None CT CERVICAL SPINE FINDINGS Alignment: No acute subluxation. There is reversal of normal cervical lordosis which may be positional or due to muscle spasm. Skull base and vertebrae: No acute fracture. Soft tissues and spinal canal: No prevertebral fluid or swelling. No visible canal hematoma. Disc levels: Multilevel degenerative changes with disc space narrowing and endplate irregularity and spurring. Upper chest: Emphysema.  Biapical subpleural scarring. Other: Bilateral carotid bulb calcified plaques. IMPRESSION: 1. Large intraparenchymal hemorrhage involving the posterior right frontal lobe with moderate amount of surrounding edema and associated mass effect on the right lateral ventricle and proximally 11 mm right to left midline shift. 2. Ill-defined hypodense area in the right thalamus may represent edema or metastatic disease. Further evaluation with MRI without and with contrast recommended. 3. No acute/traumatic cervical spine pathology. Multilevel degenerative changes. These results were called by telephone at the time of interpretation on 05/25/2021 at 5:30 pm to Dr. Lenard Lance, who verbally acknowledged these results. Electronically Signed   By: Elgie Collard M.D.   On: 05/25/2021 17:54   DG Pelvis Portable  Result Date:  05/25/2021 CLINICAL DATA:  Altered mental status and recent fall with pelvic pain, initial encounter EXAM: PORTABLE PELVIS 1-2 VIEWS COMPARISON:  None. FINDINGS: There is no evidence of pelvic fracture or diastasis. No pelvic bone lesions are seen. Mild colonic constipation is noted. IMPRESSION: No acute bony abnormality noted. Changes suggestive of mild colonic constipation. Electronically Signed   By: Alcide Clever M.D.   On: 05/25/2021  18:34    ____________________________________________   PROCEDURES  Procedure(s) performed (including Critical Care):  .Critical Care Performed by: Gilles Chiquito, MD Authorized by: Gilles Chiquito, MD   Critical care provider statement:    Critical care time (minutes):  30   Critical care was necessary to treat or prevent imminent or life-threatening deterioration of the following conditions:  CNS failure or compromise   Critical care was time spent personally by me on the following activities:  Development of treatment plan with patient or surrogate, discussions with consultants, evaluation of patient's response to treatment, examination of patient, ordering and review of laboratory studies, ordering and review of radiographic studies, ordering and performing treatments and interventions, pulse oximetry, re-evaluation of patient's condition and review of old charts   ____________________________________________   INITIAL IMPRESSION / ASSESSMENT AND PLAN / ED COURSE      Patient presents with above-stated history exam for assessment after she had unwitnessed fall today after it seems she arrived home at her daughter-in-law's house following recent discharge from hospital in [place] where she was diagnosed with a intraparenchymal hemorrhage thought to be secondary to sympathomimetic abuse.  She also had an episode of her headache worsening yesterday although it seems she was not evaluated in emergency department yesterday and per daughter-in-law was  discharged with left body paralysis and numbness.  On arrival patient is slight tachycardic with otherwise stable vital signs on room air.  On exam she does have left hemibody weakness and seems to have a left-sided visual field deficit as well as decreased sensation to light touch in the left hemibody.  I suspect these are all related to recent intraparenchymal hemorrhage and is possible there was an acute component of this yesterday as this is when patient reportedly had increase of her headache.  She denies any drug use since leaving the hospital or other clear acute associated symptoms although is a little bit confused on arrival.  CT head shows a large intraparenchymal hemorrhage involving the posterior right frontal lobe with moderate amount of surrounding edema and associated mass-effect on the right lateral ventricle approximately 11 mm right to left midline shift.  There is also an ill-defined hypodensity in the right thalamus possibly representing edema or metastatic disease.  CT C-spine shows no acute C-spine fracture or traumatic subluxation although is some degenerative changes noted.  Chest x-ray shows no focal consolidation, pneumothorax, rib fracture, effusion, edema, or other acute thoracic process.  Pelvis x-ray shows no acute fracture dislocation although possibly some constipation.    I suspect this large hemorrhage is likely etiology of patient's deficits and confusion.  There is no other obvious evidence on exam or imaging of other significant injury.  CMP shows no significant electrolyte or metabolic derangements.  COVID influenza PCR is negative.  ECG and nonelevated troponin are not suggestive of ACS or significant arrhythmia at this time.  TSH and INR unremarkable.  I discussed above findings with on-call neurosurgeon Dr. Adriana Simas as well as on-call neurologist Dr.Stack.  Dr. Adriana Simas recommended no emergent surgical intervention give it seems patient's deficits today are similar to  those 48 hours ago when she was discharged and that this would not be an indication for acute evacuation of patient's hemorrhage. Dr Selina Cooley recommends transfer to [facility] for admission to neuro ICU.  I will also attempt to obtain records from patient's recent stay in [place].  Patient excepted by Dr.Khaliqdina.       ____________________________________________   FINAL CLINICAL IMPRESSION(S) / ED DIAGNOSES  Final diagnoses:  Intraparenchymal hematoma of brain, right, with unknown loss of consciousness status, subsequent encounter    Medications  acetaminophen (TYLENOL) tablet 1,000 mg (has no administration in time range)     ED Discharge Orders     None        Note:  This document was prepared using Dragon voice recognition software and may include unintentional dictation errors.    Gilles Chiquito, MD 05/25/21 [phone]

## 2021-05-25 NOTE — ED Notes (Signed)
Received a call from [facility] to give bed assignment and to gather equipment information for transport.

## 2021-05-26 ENCOUNTER — Inpatient Hospital Stay (HOSPITAL_COMMUNITY): Payer: Medicaid Other

## 2021-05-26 ENCOUNTER — Inpatient Hospital Stay (HOSPITAL_COMMUNITY): Payer: Self-pay

## 2021-05-26 DIAGNOSIS — I610 Nontraumatic intracerebral hemorrhage in hemisphere, subcortical: Secondary | ICD-10-CM

## 2021-05-26 DIAGNOSIS — F1990 Other psychoactive substance use, unspecified, uncomplicated: Secondary | ICD-10-CM

## 2021-05-26 DIAGNOSIS — L8990 Pressure ulcer of unspecified site, unspecified stage: Secondary | ICD-10-CM | POA: Insufficient documentation

## 2021-05-26 LAB — MRSA NEXT GEN BY PCR, NASAL: MRSA by PCR Next Gen: NOT DETECTED

## 2021-05-26 LAB — RAPID URINE DRUG SCREEN, HOSP PERFORMED
Amphetamines: NOT DETECTED
Barbiturates: NOT DETECTED
Benzodiazepines: NOT DETECTED
Cocaine: NOT DETECTED
Opiates: NOT DETECTED
Tetrahydrocannabinol: NOT DETECTED

## 2021-05-26 LAB — HIV ANTIBODY (ROUTINE TESTING W REFLEX): HIV Screen 4th Generation wRfx: NONREACTIVE

## 2021-05-26 LAB — SODIUM: Sodium: 137 mmol/L (ref 135–145)

## 2021-05-26 MED ORDER — MELATONIN 3 MG PO TABS
3.0000 mg | ORAL_TABLET | Freq: Every day | ORAL | Status: DC
  Administered 2021-05-26 – 2021-05-28 (×3): 3 mg via ORAL
  Filled 2021-05-26 (×4): qty 1

## 2021-05-26 MED ORDER — GADOBUTROL 1 MMOL/ML IV SOLN
5.0000 mL | Freq: Once | INTRAVENOUS | Status: AC | PRN
  Administered 2021-05-26: 12:00:00 5 mL via INTRAVENOUS

## 2021-05-26 MED ORDER — LEVETIRACETAM 100 MG/ML PO SOLN
500.0000 mg | Freq: Two times a day (BID) | ORAL | Status: DC
  Administered 2021-05-26 – 2021-05-27 (×4): 500 mg via ORAL
  Filled 2021-05-26 (×5): qty 5

## 2021-05-26 MED ORDER — ONDANSETRON HCL 4 MG/2ML IJ SOLN
4.0000 mg | Freq: Four times a day (QID) | INTRAMUSCULAR | Status: DC | PRN
  Administered 2021-05-28: 14:00:00 4 mg via INTRAVENOUS
  Filled 2021-05-26: qty 2

## 2021-05-26 MED ORDER — QUETIAPINE FUMARATE 25 MG PO TABS
25.0000 mg | ORAL_TABLET | Freq: Once | ORAL | Status: AC
  Administered 2021-05-26: 23:00:00 25 mg via ORAL
  Filled 2021-05-26: qty 1

## 2021-05-26 MED ORDER — LISINOPRIL 2.5 MG PO TABS
2.5000 mg | ORAL_TABLET | Freq: Every day | ORAL | Status: DC
  Administered 2021-05-26 – 2021-05-29 (×4): 2.5 mg via ORAL
  Filled 2021-05-26 (×4): qty 1

## 2021-05-26 MED ORDER — LORAZEPAM 2 MG/ML IJ SOLN
1.0000 mg | Freq: Once | INTRAMUSCULAR | Status: AC
  Administered 2021-05-26: 11:00:00 1 mg via INTRAVENOUS
  Filled 2021-05-26: qty 1

## 2021-05-26 NOTE — Progress Notes (Signed)
PT Cancellation Note  Patient Details Name: Monica Miranda MRN: [id number] DOB: 1961/05/22   Cancelled Treatment:    Reason Eval/Treat Not Completed: Active bedrest order Will evaluate once activity orders are updated.   Minoru Chap A. Dan Humphreys PT, DPT Acute Rehabilitation Services Pager [phone] Office [phone]    Viviann Spare 05/26/2021, 8:29 AM

## 2021-05-26 NOTE — Evaluation (Signed)
Occupational Therapy Evaluation Patient Details Name: Monica Miranda MRN: [id number] DOB: Oct 10, 1960 Today's Date: 05/26/2021   History of Present Illness 60 y/o female presented to [facility] ED on 12/20 with R IPH with L sided weakness. Was admitted in [place] from 12/2-12/01/2021 with large R thalamic/basal ganglia/frontotemporal IPH and drug screen positive for cocaine and amphetamines. Transferred to [facility]. MRI showed large IPH centered at frontotemporal region, estimated volume 54 mL with medial extension in to R basal ganglia, thalamus, R cerebral peduncle and midbrain. PMH: COPD, rib fracture, polysubstance abuse.   Clinical Impression   Patient admitted for the diagnosis above.  PTA she was living in [place], had a stroke there, and was in the process of returning to [place] to live with family when she extended her previous CVA.  In [place] she lived alone, but was coming back to live with her son and DIL.  Deficits impacting independence are listed below.  Currently she is needing cues to attend to her L visual field, up to +2 for transfers, and up to total A for lower body ADL from bedlevel.  OT will need to assess her for L UE splinting needs.  Continue in the acute setting and AIR is recommended for post acute.       Recommendations for follow up therapy are one component of a multi-disciplinary discharge planning process, led by the attending physician.  Recommendations may be updated based on patient status, additional functional criteria and insurance authorization.   Follow Up Recommendations  Acute inpatient rehab (3hours/day)    Assistance Recommended at Discharge Frequent or constant Supervision/Assistance  Functional Status Assessment  Patient has had a recent decline in their functional status and demonstrates the ability to make significant improvements in function in a reasonable and predictable amount of time.  Equipment Recommendations  BSC/3in1;Wheelchair (measurements OT);Wheelchair  cushion (measurements OT)    Recommendations for Other Services       Precautions / Restrictions Precautions Precautions: Fall Precaution Comments: BP <150 Restrictions Weight Bearing Restrictions: No      Mobility Bed Mobility Overal bed mobility: Needs Assistance Bed Mobility: Supine to Sit     Supine to sit: Mod assist     General bed mobility comments: ModA bringing L LE off bed and trunk elevation    Transfers Overall transfer level: Needs assistance Equipment used: 2 person hand held assist Transfers: Sit to/from Stand;Bed to chair/wheelchair/BSC Sit to Stand: Mod assist;+2 physical assistance     Step pivot transfers: Mod assist;+2 physical assistance     General transfer comment: modA+2 for sit to stand from EOB. Blocking L knee and cues required for sequencing. Patient able to take steps towards chair but modA+2 to maintain standing balance and cues for sequencing      Balance Overall balance assessment: Needs assistance Sitting-balance support: Single extremity supported;Feet supported Sitting balance-Leahy Scale: Fair     Standing balance support: Bilateral upper extremity supported Standing balance-Leahy Scale: Poor Standing balance comment: reliant on UE support and external assist                           ADL either performed or assessed with clinical judgement   ADL Overall ADL's : Needs assistance/impaired Eating/Feeding: Minimal assistance;Cueing for compensatory techinques   Grooming: Wash/dry hands;Wash/dry face;Minimal assistance;Cueing for compensatory techniques;Sitting   Upper Body Bathing: Moderate assistance;Bed level   Lower Body Bathing: Total assistance;Bed level   Upper Body Dressing : Moderate assistance;Sitting   Lower  Body Dressing: Total assistance;Bed level   Toilet Transfer: Moderate assistance;+2 for physical assistance                   Vision Baseline Vision/History: 1 Wears glasses Ability  to See in Adequate Light: 1 Impaired Vision Assessment?: Yes Tracking/Visual Pursuits: Decreased smoothness of horizontal tracking;Requires cues, head turns, or add eye shifts to track;Unable to hold eye position out of midline;Decreased smoothness of vertical tracking Visual Fields: Left visual field deficit     Perception Perception Perception: Impaired Inattention/Neglect: Does not attend to left visual field;Does not attend to left side of body   Praxis Praxis Praxis: Intact    Pertinent Vitals/Pain Pain Assessment: Faces Faces Pain Scale: No hurt Pain Intervention(s): Monitored during session     Hand Dominance Right   Extremity/Trunk Assessment Upper Extremity Assessment Upper Extremity Assessment: LUE deficits/detail LUE Deficits / Details: no AROM noted to L UE.  Increased flexor tone noted to L UE LUE: Shoulder pain with ROM LUE Sensation: decreased light touch LUE Coordination: decreased fine motor;decreased gross motor   Lower Extremity Assessment Lower Extremity Assessment: Defer to PT evaluation LLE Deficits / Details: grossly 2-/5 LLE Sensation: decreased proprioception LLE Coordination: decreased fine motor;decreased gross motor   Cervical / Trunk Assessment Cervical / Trunk Assessment: Normal   Communication Communication Communication: No difficulties   Cognition Arousal/Alertness: Awake/alert Behavior During Therapy: WFL for tasks assessed/performed Overall Cognitive Status: Impaired/Different from baseline Area of Impairment: Orientation;Attention;Memory;Following commands;Safety/judgement;Awareness;Problem solving                 Orientation Level: Disoriented to;Place;Time Current Attention Level: Sustained Memory: Decreased recall of precautions;Decreased short-term memory Following Commands: Follows one step commands with increased time Safety/Judgement: Decreased awareness of safety Awareness: Emergent Problem Solving: Slow  processing;Difficulty sequencing;Requires verbal cues General Comments: states that she is in [place] and it's January 2012. Following commands with increased time but easily distracted by being cold. cues required for redirection.     General Comments       Exercises     Shoulder Instructions      Home Living Family/patient expects to be discharged to:: Private residence Living Arrangements: Children Available Help at Discharge: Family;Available 24 hours/day Type of Home: House Home Access: Stairs to enter [employer] of Steps: 2 Entrance Stairs-Rails: Right;Left Home Layout: One level     Bathroom Shower/Tub: Chief Strategy Officer: Standard     Home Equipment: None          Prior Functioning/Environment Prior Level of Function : Independent/Modified Independent                        OT Problem List: Decreased strength;Decreased range of motion;Decreased activity tolerance;Impaired balance (sitting and/or standing);Impaired vision/perception;Decreased knowledge of precautions;Impaired UE functional use;Decreased safety awareness;Impaired tone;Impaired sensation      OT Treatment/Interventions: Self-care/ADL training;Therapeutic exercise;Therapeutic activities;Balance training;DME and/or AE instruction;Patient/family education;Visual/perceptual remediation/compensation;Cognitive remediation/compensation;Neuromuscular education    OT Goals(Current goals can be found in the care plan section) Acute Rehab OT Goals Patient Stated Goal: I want to get out of here and go home OT Goal Formulation: With patient Time For Goal Achievement: 06/09/21 Potential to Achieve Goals: Good ADL Goals Pt Will Perform Eating: with set-up;sitting Pt Will Perform Grooming: with set-up;sitting Pt Will Perform Upper Body Bathing: sitting;with min assist Pt Will Perform Upper Body Dressing: with min guard assist;sitting Pt Will Transfer to Toilet: with min  assist;squat pivot transfer;bedside commode Pt/caregiver will  Perform Home Exercise Program: Increased ROM;Left upper extremity;With minimal assist  OT Frequency: Min 2X/week   Barriers to D/C:    none noted       Co-evaluation PT/OT/SLP Co-Evaluation/Treatment: Yes Reason for Co-Treatment: Complexity of the patient's impairments (multi-system involvement);Necessary to address cognition/behavior during functional activity;For patient/therapist safety;To address functional/ADL transfers PT goals addressed during session: Mobility/safety with mobility;Balance OT goals addressed during session: ADL's and self-care      AM-PAC OT "6 Clicks" Daily Activity     Outcome Measure Help from another person eating meals?: A Little Help from another person taking care of personal grooming?: A Little Help from another person toileting, which includes using toliet, bedpan, or urinal?: A Lot Help from another person bathing (including washing, rinsing, drying)?: A Lot Help from another person to put on and taking off regular upper body clothing?: A Lot Help from another person to put on and taking off regular lower body clothing?: Total 6 Click Score: 13   End of Session Equipment Utilized During Treatment: Gait belt  Activity Tolerance: Patient tolerated treatment well Patient left: in chair;with call bell/phone within reach;with chair alarm set;with family/visitor present  OT Visit Diagnosis: Unsteadiness on feet (R26.81);Other abnormalities of gait and mobility (R26.89);Muscle weakness (generalized) (M62.81);Other symptoms and signs involving cognitive function;Hemiplegia and hemiparesis;Low vision, both eyes (H54.2) Hemiplegia - Right/Left: Left Hemiplegia - dominant/non-dominant: Non-Dominant                Time: 8251-8984 OT Time Calculation (min): 33 min Charges:  OT General Charges $OT Visit: 1 Visit OT Evaluation $OT Eval Moderate Complexity: 1 Mod  05/26/2021  RP, OTR/L  Acute  Rehabilitation Services  Office:  [phone]   Monica Miranda 05/26/2021, 5:40 PM

## 2021-05-26 NOTE — H&P (Signed)
NEUROLOGY CONSULTATION NOTE   Date of service: May 26, 2021 Patient Name: Monica Miranda MRN:  [id number] DOB:  05/08/1961 _ _ _   _ __   _ __ _ _  __ __   _ __   __ _  History of Present Illness  Monica Miranda is a 60 y.o. female with PMH significant for COPD, rib fracture who presented to [facility] with right intraparenchymal hemorrhage with left-sided weakness.  Patient was admitted at [facility] in [place] from 12/2 to 05/13/2021.  She was found to have large right thalamic/basal ganglia/frontotemporal intraparenchymal hemorrhage with an ICH score of 3 initially at OSH. ICH was stable on multiple repeat CT scans.  Her drug screen was positive for cocaine and amphetamines along with a history of daily alcohol use.  She was started on hypertonic saline and monitored closely in the ICU and required nicardipine drip.  She was discharged on Salt tablets 2gram every 8 hours with goal sodium of > 140, also started on keppra 500mg  BID but no mention of seizure in the discharge summary. She was supposed to be discharged to rehab per daughter but was sent home due to insurance issues.  On the drive from [place], she had worsening of her left sided weakness while they were driving through [place]. She did not seek attention for that. Yesterday, she fell out of recliner and family took her to [facility].  ICH score: 1. NIHSS components Score: Comment  1a Level of Conscious 0[x]  1[]  2[]  3[]      1b LOC Questions 0[x]  1[]  2[]       1c LOC Commands 0[x]  1[]  2[]       2 Best Gaze 0[x]  1[]  2[]       3 Visual 0[]  1[]  2[x]  3[]      4 Facial Palsy 0[]  1[x]  2[]  3[]      5a Motor Arm - left 0[]  1[]  2[x]  3[]  4[]  UN[]    5b Motor Arm - Right 0[x]  1[]  2[]  3[]  4[]  UN[]    6a Motor Leg - Left 0[]  1[]  2[x]  3[]  4[]  UN[]    6b Motor Leg - Right 0[x]  1[]  2[]  3[]  4[]  UN[]    7 Limb Ataxia 0[x]  1[]  2[]  3[]  UN[]     8 Sensory 0[]  1[]  2[x]  UN[]      9 Best Language 0[x]  1[]  2[]  3[]      10 Dysarthria 0[]  1[x]  2[]   UN[]      11 Extinct. and Inattention 0[x]  1[]  2[]       TOTAL: 10     ROS   Constitutional Denies weight loss, fever and chills.   HEENT Denies changes in vision and hearing.   Respiratory Denies SOB and cough.   CV Denies palpitations and CP   GI Denies abdominal pain, nausea, vomiting and diarrhea.   GU Denies dysuria and urinary frequency.   MSK Denies myalgia and joint pain.   Skin Denies rash and pruritus.   Neurological Denies headache and syncope.   Psychiatric Denies recent changes in mood. Denies anxiety and depression.    Past History   Past Medical History:  Diagnosis Date   COPD (chronic obstructive pulmonary disease) ([place])    Rib fracture    4 months ago   Stroke [facility])    Past Surgical History:  Procedure Laterality Date   ESOPHAGEAL DILATION     No family history on file. Social History   Socioeconomic History   Marital status: Legally Separated    Spouse name: Not on file  Number of children: Not on file   Years of education: Not on file   Highest education level: Not on file  Occupational History   Not on file  Tobacco Use   Smoking status: Every Day    Packs/day: 0.50    Types: Cigarettes   Smokeless tobacco: Never  Vaping Use   Vaping Use: Never used  Substance and Sexual Activity   Alcohol use: Yes    Alcohol/week: 1.0 standard drink    Types: 1 Cans of beer per week   Drug use: Yes    Types: Marijuana, Cocaine    Comment: meth- reports last use was before stroke   Sexual activity: Not on file  Other Topics Concern   Not on file  Social History Narrative   Not on file   Social Determinants of Health   Financial Resource Strain: Not on file  Food Insecurity: Not on file  Transportation Needs: Not on file  Physical Activity: Not on file  Stress: Not on file  Social Connections: Not on file   Allergies  Allergen Reactions   Codeine Rash    Medications   Medications Prior to Admission  Medication Sig  Dispense Refill Last Dose   acetaminophen (TYLENOL) 500 MG tablet Take 500 mg by mouth every 6 (six) hours as needed.      albuterol (PROVENTIL HFA;VENTOLIN HFA) 108 (90 BASE) MCG/ACT inhaler Inhale 1-2 puffs into the lungs every 6 (six) hours as needed for wheezing. (Patient not taking: Reported on 05/25/2021) 1 Inhaler 0    albuterol (PROVENTIL HFA;VENTOLIN HFA) 108 (90 BASE) MCG/ACT inhaler Inhale 1-2 puffs into the lungs every 6 (six) hours as needed for wheezing or shortness of breath. (Patient not taking: Reported on 05/25/2021) 1 Inhaler 0    amoxicillin (AMOXIL) 500 MG capsule Take 1 capsule (500 mg total) by mouth 3 (three) times daily. (Patient not taking: Reported on 05/25/2021) 30 capsule 0    cyanocobalamin 500 MCG tablet Take 500 mcg by mouth daily. (Patient not taking: Reported on 05/25/2021)      cyclobenzaprine (FLEXERIL) 10 MG tablet Take 5 mg by mouth 3 (three) times daily as needed for muscle spasms.      doxycycline (VIBRAMYCIN) 100 MG capsule Take 1 capsule (100 mg total) by mouth 2 (two) times daily. One po bid x 7 days (Patient not taking: Reported on 05/25/2021) 14 capsule 0    folic acid (FOLVITE) 1 MG tablet Take 1 mg by mouth daily.      levETIRAcetam (KEPPRA) 100 MG/ML solution Take 500 mg by mouth every 12 (twelve) hours.      lisinopril (ZESTRIL) 20 MG tablet Take 20 mg by mouth daily.      Melatonin 10 MG CAPS Take 1 capsule by mouth at bedtime.      Multiple Vitamins-Minerals (MULTIVITAMINS THER. W/MINERALS) TABS Take 1 tablet by mouth daily.   (Patient not taking: Reported on 05/25/2021)      ondansetron (ZOFRAN) 4 MG tablet Take 1 tablet (4 mg total) by mouth every 8 (eight) hours as needed for nausea or vomiting. (Patient not taking: Reported on 05/25/2021) 10 tablet 0    oxyCODONE-acetaminophen (PERCOCET) 5-325 MG tablet Take 1-2 tablets by mouth every 4 (four) hours as needed. (Patient not taking: Reported on 05/25/2021) 20 tablet 0    sodium  chloride 1 g tablet Take 1 g by mouth every 12 (twelve) hours.      traMADol (ULTRAM) 50 MG tablet Take 1 tablet (50 mg  total) by mouth every 6 (six) hours as needed. (Patient not taking: Reported on 05/25/2021) 15 tablet 0      Vitals   Vitals:   05/25/21 2115 05/25/21 2200 05/25/21 2300 05/26/21 0000  BP: 107/78 110/73 (!) 128/91   Pulse: 69 65 62   Resp: 13 17 13    Temp: 98.7 F (37.1 C)   98.7 F (37.1 C)  TempSrc: Oral   Oral  SpO2: 99% 97% 96%      There is no height or weight on file to calculate BMI.  Physical Exam   General: Laying comfortably in bed; in no acute distress. Appears cachectic and older than stated age. HENT: Normal oropharynx and mucosa. Normal external appearance of ears and nose.  Neck: Supple, no pain or tenderness  CV: No JVD. No peripheral edema.  Pulmonary: Symmetric Chest rise. Normal respiratory effort.  Abdomen: Soft to touch, non-tender.  Ext: No cyanosis, edema, or deformity  Skin: No rash. Normal palpation of skin.   Musculoskeletal: Normal digits and nails by inspection. No clubbing.   Neurologic Examination  Mental status/Cognition: Alert, oriented to self, place, month and year, good attention.  Speech/language: Fluent, comprehension intact, object naming intact, repetition intact.  Cranial nerves:   CN II Pupils equal and reactive to light, L hemianopsia.   CN III,IV,VI EOM intact, no gaze preference or deviation, no nystagmus    CN V normal sensation in V1, V2, and V3 segments bilaterally    CN VII L facial droop   CN VIII normal hearing to speech   CN IX & X normal palatal elevation, no uvular deviation   CN XI 5/5 head turn and 5/5 shoulder shrug bilaterally   CN XII midline tongue protrusion   Motor:  Muscle bulk: poor, tone normal Mvmt Root Nerve  Muscle Right Left Comments  SA C5/6 Ax Deltoid 5 2   EF C5/6 Mc Biceps 5 2   EE C6/7/8 Rad Triceps 5 2   WF C6/7 Med FCR     WE C7/8 PIN ECU     F Ab C8/T1 U ADM/FDI 5 2    HF L1/2/3 Fem Illopsoas 5 3   KE L2/3/4 Fem Quad 5 3   DF L4/5 D Peron Tib Ant 5 4   PF S1/2 Tibial Grc/Sol 5 4    Reflexes:  Right Left Comments  Pectoralis      Biceps (C5/6) 2 2   Brachioradialis (C5/6) 2 2    Triceps (C6/7) 2 2    Patellar (L3/4) 2 2    Achilles (S1)      Hoffman      Plantar     Jaw jerk    Sensation:  Light touch Decreased in LUE and LLE.   Pin prick    Temperature    Vibration   Proprioception    Coordination/Complex Motor:  - Finger to Nose intact on the R, unable to do in LUE. - Heel to shin unable to do - Rapid alternating movement are slowed. - Gait: deferred.  Labs   CBC:  Recent Labs  Lab 05/25/21 1645  WBC 7.7  NEUTROABS 5.1  HGB 14.6  HCT 44.2  MCV 86.3  PLT 0000000    Basic Metabolic Panel:  Lab Results  Component Value Date   NA 139 05/25/2021   K 3.7 05/25/2021   CO2 25 05/25/2021   GLUCOSE 92 05/25/2021   BUN 15 05/25/2021   CREATININE 0.50 05/25/2021   CALCIUM 9.2 05/25/2021  GFRNONAA >60 05/25/2021   GFRAA >90 09/16/2013   Lipid Panel: No results found for: LDLCALC HgbA1c: No results found for: HGBA1C Urine Drug Screen: No results found for: LABOPIA, COCAINSCRNUR, LABBENZ, AMPHETMU, THCU, LABBARB  Alcohol Level No results found for: [facility]  CT Head without contrast(Personally reviewed): 1. Large intraparenchymal hemorrhage involving the posterior right frontal lobe with moderate amount of surrounding edema and associated mass effect on the right lateral ventricle and proximally 11 mm right to left midline shift. 2. Ill-defined hypodense area in the right thalamus may represent edema or metastatic disease. Further evaluation with MRI without and with contrast recommended. 3. No acute/traumatic cervical spine pathology. Multilevel degenerative changes.  MRI Brain: pending  Impression   Monica Miranda is a 60 y.o. female with PMH significant for COPD, rib fracture who presented to [facility] with right  intraparenchymal hemorrhage with left-sided weakness. Her neurologic examination is notable for L sided weakness and L hemianopsia. CTH demonstrates large R IPH with an ICH score of 1.   Unable to access images from OSH and it is unclear if the [place] is stable compared to prior imaging.  Impression: Acute right thalamic/basal ganglia/frontotemporal intraparenchymal hemorrhage Cerebral Edema with mass effect Polysubstance abuse  Recommendations  - Admit to ICU - Stability scan in 6 hours or STAT with any neurological decline - Frequent neuro checks; q42min for 1 hour, then q1hour - No antiplatelets or anticoagulants due to [place] - SCD for DVT prophylaxis, pharmacological DVT ppx at 24 hours if ICH is stable - Blood pressure control with goal systolic less than [id number], cleverplex and labetalol PRN - MRI brain with and without contrast. Did not get this at OSH per records in care everywhere. - MRA without contrast of the brain to evaluate for underlying vascular abnormality. - PT consult, OT consult, Speech consult. - Stroke team to follow - Unclear why she is on Keppra, will need to clarify in AM with Outside Hospital. - appears to be discharged on Salt tablets 2g q8h to keep her Sodium above 135. Will monitor sodium daily x 3 days and hold off on salt tablets for now.   This patient is critically ill and at significant risk of neurological worsening, death and care requires constant monitoring of vital signs, hemodynamics,respiratory and cardiac monitoring, neurological assessment, discussion with family, other specialists and medical decision making of high complexity. I spent 35 minutes of neurocritical care time  in the care of  this patient. This was time spent independent of any time provided by nurse practitioner or PA.  Donnetta Simpers [facility] Pager Number [phone] 05/26/2021  3:59 AM ______________________________________________________________________   Thank  you for the opportunity to take part in the care of this patient. If you have any further questions, please contact the neurology consultation attending.  Signed,  [place] Pager Number [phone] _ _ _   _ __   _ __ _ _  __ __   _ __   __ _

## 2021-05-26 NOTE — Progress Notes (Signed)
Carotid artery duplex completed. Refer to "CV Proc" under chart review to view preliminary results.  05/26/2021 3:17 PM Eula Fried., MHA, RVT, RDCS, RDMS

## 2021-05-26 NOTE — TOC CAGE-AID Note (Signed)
Transition of Care [facility]) - CAGE-AID Screening   Patient Details  Name: Monica Miranda MRN: [id number] Date of Birth: 01-05-1961  Transition of [facility]) CM/SW Contact:    Annaleah Arata C Tarpley-Carter, LCSWA Phone Number: 05/26/2021, 11:59 AM   Clinical Narrative: Pt is unable to participate in Cage Aid.  Cherica Heiden Tarpley-Carter, MSW, LCSW-A Pronouns:  She/Her/Hers [place] Transitions of Care Clinical Social Worker Direct Number:  [phone] [email or web address]  CAGE-AID Screening: Substance Abuse Screening unable to be completed due to: : Patient unable to participate             Substance Abuse Education Offered: No

## 2021-05-26 NOTE — Progress Notes (Signed)
°  Transition of Care [facility]) Screening Note   Patient Details  Name: Monica Miranda Date of Birth: 07/10/1960   Transition of Care [facility]) CM/SW Contact:    Mearl Latin, LCSW Phone Number: 05/26/2021, 3:53 PM    Transition of Care Department [facility]) has reviewed patient and no TOC needs have been identified at this time. We will continue to monitor patient advancement through interdisciplinary progression rounds. If new patient transition needs arise, please place a TOC consult.

## 2021-05-26 NOTE — Evaluation (Signed)
Physical Therapy Evaluation Patient Details Name: Monica Miranda MRN: [id number] DOB: February 24, 1961 Today's Date: 05/26/2021  History of Present Illness  60 y/o female presented to [facility] ED on 12/20 with R IPH with L sided weakness. Was admitted in [place] from 12/2-12/01/2021 with large R thalamic/basal ganglia/frontotemporal IPH and drug screen positive for cocaine and amphetamines. Transferred to [facility]. MRI showed large IPH centered at frontotemporal region, estimated volume 54 mL with medial extension in to R basal ganglia, thalamus, R cerebral peduncle and midbrain. PMH: COPD, rib fracture, polysubstance abuse.  Clinical Impression  Patient admitted with above diagnosis. Patient presents with L sided weakness, impaired coordination, impaired balance, decreased activity tolerance, and impaired cognition. Patient with deficits in attention, awareness, orientation, and safety. Patient requires +2 assist for OOB mobility to chair. Patient able to take steps towards recliner with max cues for sequencing. Daughter in law states patient will be discharging to her home since she is able to provide 24 hour assistance. Patient will benefit from skilled PT services during acute stay to address listed deficits. Recommend CIR at discharge to maximize functional independence and safety.        Recommendations for follow up therapy are one component of a multi-disciplinary discharge planning process, led by the attending physician.  Recommendations may be updated based on patient status, additional functional criteria and insurance authorization.  Follow Up Recommendations Acute inpatient rehab (3hours/day)    Assistance Recommended at Discharge Frequent or constant Supervision/Assistance  Functional Status Assessment Patient has had a recent decline in their functional status and demonstrates the ability to make significant improvements in function in a reasonable and predictable amount of time.  Equipment  Recommendations  Other (comment) (TBD)    Recommendations for Other Services Rehab consult     Precautions / Restrictions Precautions Precautions: Fall Precaution Comments: BP <150 Restrictions Weight Bearing Restrictions: No      Mobility  Bed Mobility Overal bed mobility: Needs Assistance Bed Mobility: Supine to Sit     Supine to sit: Mod assist     General bed mobility comments: ModA bringing L LE off bed and trunk elevation    Transfers Overall transfer level: Needs assistance Equipment used: 2 person hand held assist Transfers: Sit to/from Stand;Bed to chair/wheelchair/BSC Sit to Stand: Mod assist;+2 physical assistance   Step pivot transfers: Mod assist;+2 physical assistance       General transfer comment: modA+2 for sit to stand from EOB. Blocking L knee and cues required for sequencing. Patient able to take steps towards chair but modA+2 to maintain standing balance and cues for sequencing    Ambulation/Gait                  Stairs            Wheelchair Mobility    Modified Rankin (Stroke Patients Only) Modified Rankin (Stroke Patients Only) Pre-Morbid Rankin Score: No significant disability Modified Rankin: Severe disability     Balance Overall balance assessment: Needs assistance Sitting-balance support: Single extremity supported;Feet supported Sitting balance-Leahy Scale: Fair     Standing balance support: Bilateral upper extremity supported Standing balance-Leahy Scale: Poor Standing balance comment: reliant on UE support and external assist                             Pertinent Vitals/Pain Pain Assessment: Faces Faces Pain Scale: No hurt Pain Intervention(s): Monitored during session    Home Living Family/patient expects to be discharged  to:: Private residence Living Arrangements: Children Available Help at Discharge: Family;Available 24 hours/day Type of Home: House Home Access: Stairs to enter Entrance  Stairs-Rails: Doctor, general practice of Steps: 2   Home Layout: One level Home Equipment: None      Prior Function Prior Level of Function : Independent/Modified Independent                     Hand Dominance   Dominant Hand: Right    Extremity/Trunk Assessment   Upper Extremity Assessment Upper Extremity Assessment: Defer to OT evaluation    Lower Extremity Assessment Lower Extremity Assessment: LLE deficits/detail LLE Deficits / Details: grossly 2-/5 LLE Sensation: decreased proprioception LLE Coordination: decreased fine motor;decreased gross motor    Cervical / Trunk Assessment Cervical / Trunk Assessment: Normal  Communication   Communication: No difficulties  Cognition Arousal/Alertness: Awake/alert Behavior During Therapy: WFL for tasks assessed/performed Overall Cognitive Status: Impaired/Different from baseline Area of Impairment: Orientation;Attention;Memory;Following commands;Safety/judgement;Awareness;Problem solving                 Orientation Level: Disoriented to;Place;Time Current Attention Level: Sustained Memory: Decreased recall of precautions;Decreased short-term memory Following Commands: Follows one step commands with increased time Safety/Judgement: Decreased awareness of safety Awareness: Emergent Problem Solving: Slow processing;Difficulty sequencing;Requires verbal cues General Comments: states that she is in [place] and it's January 2012. Following commands with increased time but easily distracted by being cold. cues required for redirection.        General Comments      Exercises     Assessment/Plan    PT Assessment Patient needs continued PT services  PT Problem List Decreased strength;Decreased activity tolerance;Decreased mobility;Decreased balance;Decreased coordination;Decreased knowledge of precautions;Decreased knowledge of use of DME;Decreased safety awareness;Decreased cognition;Impaired  sensation       PT Treatment Interventions DME instruction;Stair training;Gait training;Functional mobility training;Therapeutic activities;Balance training;Therapeutic exercise;Neuromuscular re-education;Patient/family education    PT Goals (Current goals can be found in the Care Plan section)  Acute Rehab PT Goals Patient Stated Goal: to get better PT Goal Formulation: With patient/family Time For Goal Achievement: 06/09/21 Potential to Achieve Goals: Good    Frequency Min 4X/week   Barriers to discharge        Co-evaluation PT/OT/SLP Co-Evaluation/Treatment: Yes Reason for Co-Treatment: For patient/therapist safety;To address functional/ADL transfers PT goals addressed during session: Mobility/safety with mobility;Balance         AM-PAC PT "6 Clicks" Mobility  Outcome Measure Help needed turning from your back to your side while in a flat bed without using bedrails?: A Lot Help needed moving from lying on your back to sitting on the side of a flat bed without using bedrails?: A Lot Help needed moving to and from a bed to a chair (including a wheelchair)?: Total Help needed standing up from a chair using your arms (e.g., wheelchair or bedside chair)?: Total Help needed to walk in hospital room?: Total Help needed climbing 3-5 steps with a railing? : Total 6 Click Score: 8    End of Session Equipment Utilized During Treatment: Gait belt Activity Tolerance: Patient tolerated treatment well Patient left: in chair;with call bell/phone within reach;with chair alarm set;with family/visitor present Nurse Communication: Mobility status PT Visit Diagnosis: Unsteadiness on feet (R26.81);Muscle weakness (generalized) (M62.81);Difficulty in walking, not elsewhere classified (R26.2);Hemiplegia and hemiparesis Hemiplegia - Right/Left: Left Hemiplegia - dominant/non-dominant: Non-dominant Hemiplegia - caused by: Nontraumatic intracerebral hemorrhage    Time: 3220-2542 PT Time  Calculation (min) (ACUTE ONLY): 29 min   Charges:  PT Evaluation $PT Eval Moderate Complexity: 1 Mod          [facility] K[facility] A. Dan Humphreys PT, DPT Acute Rehabilitation Services Pager [phone] Office [phone]   Viviann Spare 05/26/2021, 5:26 PM

## 2021-05-26 NOTE — Progress Notes (Signed)
STROKE TEAM PROGRESS NOTE   INTERVAL HISTORY Her nurse is at the bedside.  Patient stable. No indication for ICU level care. Will transfer to floor under care of medical Hospitalists.  Blood pressure adequately controlled even off any blood pressure drips.  Patient is awake and follows commands well and has left hemiparesis.  CT scan and MRI shows subacute large right parenchymal hemorrhage with mild cytotoxic edema and mild right to left midline shift of 5 mm which is a lot improved compared to her initial CT in [place] reportedly stated 5 cm hemorrhage with 10 mm right-to-left shift.  Her daughter-in-law arrived in the afternoon and I spoke to her.  Vitals:   05/26/21 0510 05/26/21 0600 05/26/21 0700 05/26/21 0800  BP: 130/84 132/87 (!) 128/101 (!) 166/83  Pulse: (!) 164 68 66 64  Resp:  Temp:    97.9 F (36.6 C)  TempSrc:    Oral  SpO2: 100% 100% 96% 98%   CBC:  Recent Labs  Lab 05/25/21 1645  WBC 7.7  NEUTROABS 5.1  HGB 14.6  HCT 44.2  MCV 86.3  PLT 384   Basic Metabolic Panel:  Recent Labs  Lab 05/25/21 1645 05/26/21 0457  NA 139 137  K 3.7  --   CL 108  --   CO2 25  --   GLUCOSE 92  --   BUN 15  --   CREATININE 0.50  --   CALCIUM 9.2  --    Urine Drug Screen:  Recent Labs  Lab 05/26/21 0522  LABOPIA NONE DETECTED  COCAINSCRNUR NONE DETECTED  LABBENZ NONE DETECTED  AMPHETMU NONE DETECTED  THCU NONE DETECTED  LABBARB NONE DETECTED    Alcohol Level No results for input(s): ETH in the last 168 hours.  IMAGING past 24 hours CT Head Wo Contrast  Result Date: 05/25/2021 CLINICAL DATA:  Altered mental status. EXAM: CT HEAD WITHOUT CONTRAST CT CERVICAL SPINE WITHOUT CONTRAST TECHNIQUE: Multidetector CT imaging of the head and cervical spine was performed following the standard protocol without intravenous contrast. Multiplanar CT image reconstructions of the cervical spine were also generated. COMPARISON:  None. FINDINGS: CT HEAD FINDINGS Brain:  There is a large intraparenchymal hemorrhage involving the posterior right frontal lobe extending into the temporal and measuring approximately 3.6 x 2.7 cm in greatest axial dimensions. There is moderate amount of surrounding edema with associated mass effect on the right lateral ventricle and proximally 11 mm right to left midline shift. There may be extension of a small amount of blood into the right thalamus. There is a 1.5 x 1.1 cm ill-defined hypodense area in the right thalamus which may represent edema or metastatic disease. Additionally there is some edema surrounding the occipital horn of the left lateral ventricle. Findings may represent metastatic disease. Further evaluation with MRI without and with contrast recommended. There is mild dilatation of the left lateral ventricle which may represent a degree of entrapment. Vascular: No hyperdense vessel or unexpected calcification. Skull: Normal. Negative for fracture or focal lesion. Sinuses/Orbits: No acute finding. Other: None CT CERVICAL SPINE FINDINGS Alignment: No acute subluxation. There is reversal of normal cervical lordosis which may be positional or due to muscle spasm. Skull base and vertebrae: No acute fracture. Soft tissues and spinal canal: No prevertebral fluid or swelling. No visible canal hematoma. Disc levels: Multilevel degenerative changes with disc space narrowing and endplate irregularity and spurring. Upper chest: Emphysema.  Biapical subpleural scarring. Other: Bilateral carotid bulb calcified plaques.  IMPRESSION: 1. Large intraparenchymal hemorrhage involving the posterior right frontal lobe with moderate amount of surrounding edema and associated mass effect on the right lateral ventricle and proximally 11 mm right to left midline shift. 2. Ill-defined hypodense area in the right thalamus may represent edema or metastatic disease. Further evaluation with MRI without and with contrast recommended. 3. No acute/traumatic cervical  spine pathology. Multilevel degenerative changes. These results were called by telephone at the time of interpretation on 05/25/2021 at 5:30 pm to Dr. Lenard Lance, who verbally acknowledged these results. Electronically Signed   By: Elgie Collard M.D.   On: 05/25/2021 17:54   CT Cervical Spine Wo Contrast  Result Date: 05/25/2021 CLINICAL DATA:  Altered mental status. EXAM: CT HEAD WITHOUT CONTRAST CT CERVICAL SPINE WITHOUT CONTRAST TECHNIQUE: Multidetector CT imaging of the head and cervical spine was performed following the standard protocol without intravenous contrast. Multiplanar CT image reconstructions of the cervical spine were also generated. COMPARISON:  None. FINDINGS: CT HEAD FINDINGS Brain: There is a large intraparenchymal hemorrhage involving the posterior right frontal lobe extending into the temporal and measuring approximately 3.6 x 2.7 cm in greatest axial dimensions. There is moderate amount of surrounding edema with associated mass effect on the right lateral ventricle and proximally 11 mm right to left midline shift. There may be extension of a small amount of blood into the right thalamus. There is a 1.5 x 1.1 cm ill-defined hypodense area in the right thalamus which may represent edema or metastatic disease. Additionally there is some edema surrounding the occipital horn of the left lateral ventricle. Findings may represent metastatic disease. Further evaluation with MRI without and with contrast recommended. There is mild dilatation of the left lateral ventricle which may represent a degree of entrapment. Vascular: No hyperdense vessel or unexpected calcification. Skull: Normal. Negative for fracture or focal lesion. Sinuses/Orbits: No acute finding. Other: None CT CERVICAL SPINE FINDINGS Alignment: No acute subluxation. There is reversal of normal cervical lordosis which may be positional or due to muscle spasm. Skull base and vertebrae: No acute fracture. Soft tissues and spinal  canal: No prevertebral fluid or swelling. No visible canal hematoma. Disc levels: Multilevel degenerative changes with disc space narrowing and endplate irregularity and spurring. Upper chest: Emphysema.  Biapical subpleural scarring. Other: Bilateral carotid bulb calcified plaques. IMPRESSION: 1. Large intraparenchymal hemorrhage involving the posterior right frontal lobe with moderate amount of surrounding edema and associated mass effect on the right lateral ventricle and proximally 11 mm right to left midline shift. 2. Ill-defined hypodense area in the right thalamus may represent edema or metastatic disease. Further evaluation with MRI without and with contrast recommended. 3. No acute/traumatic cervical spine pathology. Multilevel degenerative changes. These results were called by telephone at the time of interpretation on 05/25/2021 at 5:30 pm to Dr. Lenard Lance, who verbally acknowledged these results. Electronically Signed   By: Elgie Collard M.D.   On: 05/25/2021 17:54   MR MRA HEAD WO CONTRAST  Result Date: 05/26/2021 CLINICAL DATA:  Follow-up examination for intracranial hemorrhage. EXAM: MRI HEAD WITHOUT CONTRAST MRA HEAD WITHOUT CONTRAST TECHNIQUE: Multiplanar, multi-echo pulse sequences of the brain and surrounding structures were acquired without intravenous contrast. Angiographic images of the Circle of Willis were acquired using MRA technique without intravenous contrast. COMPARISON:  Comparison made with prior CT from 05/25/2021. FINDINGS: MRI HEAD FINDINGS Brain: Examination moderately to severely degraded by motion artifact. Large intraparenchymal hemorrhage centered at the right frontotemporal region again seen hematoma measures approximately 6.0 x 5.3 x  3.4 cm in greatest dimensions (estimated volume 54 mL). Hematoma extends anteriorly and inferiorly towards the right temporal pole. Medial extension to involve the right basal ganglia and thalamus, with extension into the right cerebral  peduncle and right midbrain (series 20, image 13). Scattered small volume subarachnoid hemorrhage noted within the adjacent posterior parietotemporal region (series 10, image 5). Frontotemporal region surrounding vasogenic edema with regional mass effect. Right lateral ventricle is partially effaced. Associated right-to-left shift measures up to 5 mm, similar to previous. No visible intraventricular extension of hemorrhage. Asymmetric dilatation of the left lateral ventricle with periventricular edema, compatible with trapping and transependymal flow of CSF. Basilar cisterns are mildly crowded but remain patent. Remainder of the brain is otherwise relatively normal in appearance. No other acute or subacute infarct. No other foci of susceptibility artifact to suggest acute or chronic intracranial hemorrhage. No visible mass lesion or extra-axial fluid collection. Pituitary gland suprasellar region within normal limits. Vascular: Major intracranial vascular flow voids are maintained at the skull base. Skull and upper cervical spine: Craniocervical junction within normal limits. Bone marrow signal intensity normal. No visible focal marrow replacing lesion. No scalp soft tissue abnormality. Sinuses/Orbits: Globes and orbital soft tissues grossly within normal limits. Paranasal sinuses are largely clear. No significant mastoid effusion. Other: None. MRA HEAD FINDINGS Anterior circulation: Examination moderately to severely degraded by motion artifact. Visualized distal cervical segments of the internal carotid arteries are patent with antegrade flow. Petrous, cavernous, and supraclinoid segments patent without visible stenosis or other definite abnormality. A1 segments patent bilaterally. Grossly normal anterior communicating artery complex. Anterior cerebral arteries perfused to their distal aspects without appreciable stenosis. No visible M1 stenosis. Grossly normal MCA bifurcations. Distal MCA branches perfused and  fairly symmetric. Mass effect on the distal right MCA branches related to the right cerebral hemorrhage noted. Posterior circulation: Visualized distal V4 segments patent without stenosis. Left vertebral artery dominant. Neither PICA origin visualized. Basilar grossly patent to its distal aspect without stenosis. Superior cerebellar arteries patent bilaterally. Left PCA supplied via the basilar. Fetal type origin of the right PCA. Both PCAs grossly patent to their distal aspects without appreciable stenosis. Anatomic variants: Fetal type origin of the right PCA. Other: No visible AVM or other vascular malformation seen underlying the right cerebral hemorrhage. No appreciable aneurysm on this motion degraded exam. IMPRESSION: MRI HEAD IMPRESSION: 1. Motion degraded exam. 2. Large intraparenchymal hemorrhage centered at the right frontotemporal region, estimated volume 54 mL. Medial extension to involve the right basal ganglia, thalamus, right cerebral peduncle, and midbrain. No visible underlying lesion or other structural abnormality on this motion degraded noncontrast examination. Finding could reflect a hemorrhagic infarct and/or hemorrhagic transformation given location and overall morphology. 3. Associated regional mass effect with up to 5 mm of right-to-left shift, similar to previous. Asymmetric dilatation of the left lateral ventricle with associated transependymal flow of CSF consistent with a degree of ventricular trapping. 4. Associated small volume subarachnoid hemorrhage within the adjacent posterior right parietotemporal region. MRA HEAD IMPRESSION: 1. Motion degraded exam. 2. Grossly negative intracranial MRA. No visible AVM or other vascular malformation seen underlying the right cerebral hemorrhage. Electronically Signed   By: Rise Mu M.D.   On: 05/26/2021 03:20   MR BRAIN WO CONTRAST  Result Date: 05/26/2021 CLINICAL DATA:  Follow-up examination for intracranial hemorrhage. EXAM:  MRI HEAD WITHOUT CONTRAST MRA HEAD WITHOUT CONTRAST TECHNIQUE: Multiplanar, multi-echo pulse sequences of the brain and surrounding structures were acquired without intravenous contrast. Angiographic images of the Circle  of Willis were acquired using MRA technique without intravenous contrast. COMPARISON:  Comparison made with prior CT from 05/25/2021. FINDINGS: MRI HEAD FINDINGS Brain: Examination moderately to severely degraded by motion artifact. Large intraparenchymal hemorrhage centered at the right frontotemporal region again seen hematoma measures approximately 6.0 x 5.3 x 3.4 cm in greatest dimensions (estimated volume 54 mL). Hematoma extends anteriorly and inferiorly towards the right temporal pole. Medial extension to involve the right basal ganglia and thalamus, with extension into the right cerebral peduncle and right midbrain (series 20, image 13). Scattered small volume subarachnoid hemorrhage noted within the adjacent posterior parietotemporal region (series 10, image 5). Frontotemporal region surrounding vasogenic edema with regional mass effect. Right lateral ventricle is partially effaced. Associated right-to-left shift measures up to 5 mm, similar to previous. No visible intraventricular extension of hemorrhage. Asymmetric dilatation of the left lateral ventricle with periventricular edema, compatible with trapping and transependymal flow of CSF. Basilar cisterns are mildly crowded but remain patent. Remainder of the brain is otherwise relatively normal in appearance. No other acute or subacute infarct. No other foci of susceptibility artifact to suggest acute or chronic intracranial hemorrhage. No visible mass lesion or extra-axial fluid collection. Pituitary gland suprasellar region within normal limits. Vascular: Major intracranial vascular flow voids are maintained at the skull base. Skull and upper cervical spine: Craniocervical junction within normal limits. Bone marrow signal intensity  normal. No visible focal marrow replacing lesion. No scalp soft tissue abnormality. Sinuses/Orbits: Globes and orbital soft tissues grossly within normal limits. Paranasal sinuses are largely clear. No significant mastoid effusion. Other: None. MRA HEAD FINDINGS Anterior circulation: Examination moderately to severely degraded by motion artifact. Visualized distal cervical segments of the internal carotid arteries are patent with antegrade flow. Petrous, cavernous, and supraclinoid segments patent without visible stenosis or other definite abnormality. A1 segments patent bilaterally. Grossly normal anterior communicating artery complex. Anterior cerebral arteries perfused to their distal aspects without appreciable stenosis. No visible M1 stenosis. Grossly normal MCA bifurcations. Distal MCA branches perfused and fairly symmetric. Mass effect on the distal right MCA branches related to the right cerebral hemorrhage noted. Posterior circulation: Visualized distal V4 segments patent without stenosis. Left vertebral artery dominant. Neither PICA origin visualized. Basilar grossly patent to its distal aspect without stenosis. Superior cerebellar arteries patent bilaterally. Left PCA supplied via the basilar. Fetal type origin of the right PCA. Both PCAs grossly patent to their distal aspects without appreciable stenosis. Anatomic variants: Fetal type origin of the right PCA. Other: No visible AVM or other vascular malformation seen underlying the right cerebral hemorrhage. No appreciable aneurysm on this motion degraded exam. IMPRESSION: MRI HEAD IMPRESSION: 1. Motion degraded exam. 2. Large intraparenchymal hemorrhage centered at the right frontotemporal region, estimated volume 54 mL. Medial extension to involve the right basal ganglia, thalamus, right cerebral peduncle, and midbrain. No visible underlying lesion or other structural abnormality on this motion degraded noncontrast examination. Finding could reflect a  hemorrhagic infarct and/or hemorrhagic transformation given location and overall morphology. 3. Associated regional mass effect with up to 5 mm of right-to-left shift, similar to previous. Asymmetric dilatation of the left lateral ventricle with associated transependymal flow of CSF consistent with a degree of ventricular trapping. 4. Associated small volume subarachnoid hemorrhage within the adjacent posterior right parietotemporal region. MRA HEAD IMPRESSION: 1. Motion degraded exam. 2. Grossly negative intracranial MRA. No visible AVM or other vascular malformation seen underlying the right cerebral hemorrhage. Electronically Signed   By: Rise Mu M.D.   On: 05/26/2021  03:20   DG Pelvis Portable  Result Date: 05/25/2021 CLINICAL DATA:  Altered mental status and recent fall with pelvic pain, initial encounter EXAM: PORTABLE PELVIS 1-2 VIEWS COMPARISON:  None. FINDINGS: There is no evidence of pelvic fracture or diastasis. No pelvic bone lesions are seen. Mild colonic constipation is noted. IMPRESSION: No acute bony abnormality noted. Changes suggestive of mild colonic constipation. Electronically Signed   By: Alcide Clever M.D.   On: 05/25/2021 18:34   DG Chest Port 1 View  Result Date: 05/25/2021 CLINICAL DATA:  Altered mental status. EXAM: PORTABLE CHEST 1 VIEW COMPARISON:  Chest radiograph dated 09/16/2013. FINDINGS: There is chronic interstitial coarsening. No focal consolidation, pleural effusion, pneumothorax. The cardiac silhouette is within limits atherosclerotic calcification of the aorta. Moderate size hiatal hernia increased since the prior radiograph. No acute osseous pathology. IMPRESSION: No acute cardiopulmonary process. Electronically Signed   By: Elgie Collard M.D.   On: 05/25/2021 18:35    PHYSICAL EXAM Frail middle-aged malnourished looking Caucasian lady not in distress . Afebrile. Head is nontraumatic. Neck is supple without bruit.    Cardiac exam no murmur or  gallop. Lungs are clear to auscultation. Distal pulses are well felt.  Neurological Exam : She is awake alert oriented to time place and person.  Mild dysarthria.  Right gaze preference but able to look to the left all the way.  Blinks to threat on the right but not on the left.  Moderate left lower facial weakness.  Tongue midline.  Left hemiplegia with left upper extremity 2/5 strength with drift and arm hitting the bed.  Mild left lower extremity drift with 4/5 proximally and 3/5 distal strength left foot drop.  Sensation is diminished on the left compared to the right.  Right plantars downgoing left is equivocal.  Gait not tested Subacute ASSESSMENT/PLAN Monica Miranda is a 60 y.o. female with history of COPD, rib fx presenting, recent large R ICH admitted to [facility] in [place], [place] 05/07/21-05/13/2021 found to be positive for cocaine, amphetamines, daily alcohol use, required ICU level care, needed rehab but d/c home d/t insurance issues. Presented to [facility] 05/25/2021 w/ worsening L HP while driving through to [place]. CT w/ R ICH, not new.   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   Patient had hospitalization in [facility] for the same 3 weeks ago and was discharged home but is unable to manage and returns with no new hemorrhage. Recrudescence  of previous stroke symptoms CT head large IPH posterior R frontal lobe w/ edema and mass effect, 11 midline shift. R thalamic hypodensity, ? Mets.  CSpine no acute abnormality MRI  R frontal lobe IPH w/ extension to R basal ganglia, thalamic, cerebral peduncle. No underlying lesion. 77mm midline shift. L lateral ventricle trapping. Small SAH posterior R parietal temporal lobe  MRA  Unremarkable  MRI brain w/ contrast neg mets - hemorrhage stable.  Carotid Doppler  pending  2D Echo pending  LDL 91 on 05/19/2021 in  [facility] [place]  HgbA1c  No results found for requested labs within last [id number] hours. VTE prophylaxis - SCDs  No antithrombotic prior to admission given recent hemorrhage,  now on No antithrombotic. Continue to hold antithrombotics.  Therapy recommendations:  pending  Disposition:  pending  Transfer to the floor under the care of the medical Hospitalists 12/22  Hypertension Home meds:  not taking per pt On lisinopril 20 previous Add  lisinopril 2.5 mg daily Stable Long-term BP goal normotensive  Hyperlipidemia Home meds:  no statin LDL 91 Will not add statin at this time c   ICH  Other Stroke Risk Factors Advanced Age >/= 17  Cigarette smoker, advised to stop smoking, add nicotine patch Substance abuse - UDS:  THC NONE DETECTED, Cocaine NONE DETECTED. Hx cocaine and aphetamine use, positive during Dec 22 admission  Hx stroke/TIA 05/07/21-05/13/2021 Large R ICH, [facility] in [place], [place] found to be positive for cocaine, amphetamines, daily alcohol use, required ICU level care, needed rehab but d/c home d/t insurance issues.   Other Active Problems COPD  Hospital day # 1  I have personally obtained history,examined this patient, reviewed notes, independently viewed imaging studies, participated in medical decision making and plan of care.ROS completed by me personally and pertinent positives fully documented  I have made any additions or clarifications directly to the above note. Agree with note above.  Patient had right thalamic intracerebral hemorrhage secondary to hypertension versus cocaine abuse while visiting [facility] 3 weeks ago and was discharged home and apparently refused inpatient rehab.  Now she presents with worsening of his symptoms but CT scan shows expected evolution and improvement in her subacute hemorrhage with reduced same cytotoxic edema and midline shift.lose neurological observation and strict blood pressure control with systolic goal below 160.  Mobilize out of bed.  Therapy  consults.  Likely transfer to rehab in the next few days after insurance approval and bed available.  Transfer out of the ICU when bed available and asked medical hospitalist team to take over.  Long discussion with patient and her daughter-in-law at the bedside and answered questions. This patient is critically ill and at significant risk of neurological worsening, death and care requires constant monitoring of vital signs, hemodynamics,respiratory and cardiac monitoring, extensive review of multiple databases, frequent neurological assessment, discussion with family, other specialists and medical decision making of high complexity.I have made any additions or clarifications directly to the above note.This critical care time does not reflect procedure time, or teaching time or supervisory time of PA/NP/Med Resident etc but could involve care discussion time.  I spent 30 minutes of neurocritical care time  in the care of  this patient.   Delia Heady, MD Medical Director [facility] Pager: [phone] 05/26/2021 4:02 PM   To contact Stroke Continuity provider, please refer to [email or web address]. After hours, contact General Neurology

## 2021-05-26 NOTE — Progress Notes (Signed)
SLP Cancellation Note  Patient Details Name: Monica Miranda MRN: [id number] DOB: 1960-12-14   Cancelled treatment:       Reason Eval/Treat Not Completed: Patient at procedure or test/unavailable (Pt receiving care from RN at this time. SLP will follow up later as schedule allows.)  Kenny Rea I. Vear Clock, MS, CCC-SLP Acute Rehabilitation Services Office number [phone] Pager [phone]  Scheryl Marten 05/26/2021, 3:17 PM

## 2021-05-26 NOTE — Progress Notes (Signed)
Patient admitted with silver and black ring. Placed it in a plastic bag with a patient label and put in her clothing bag.

## 2021-05-27 ENCOUNTER — Inpatient Hospital Stay (HOSPITAL_COMMUNITY): Payer: Medicaid Other

## 2021-05-27 DIAGNOSIS — I6389 Other cerebral infarction: Secondary | ICD-10-CM

## 2021-05-27 LAB — ECHOCARDIOGRAM COMPLETE
Area-P 1/2: 3.99 cm2
Height: 61 in
MV M vel: 6.93 m/s
MV Peak grad: 192.1 mmHg
S' Lateral: 2.7 cm
Weight: 1760 oz

## 2021-05-27 LAB — SODIUM: Sodium: 136 mmol/L (ref 135–145)

## 2021-05-27 MED ORDER — LEVETIRACETAM 500 MG PO TABS
500.0000 mg | ORAL_TABLET | Freq: Two times a day (BID) | ORAL | Status: DC
  Administered 2021-05-27 – 2021-05-29 (×4): 500 mg via ORAL
  Filled 2021-05-27 (×4): qty 1

## 2021-05-27 MED ORDER — PANTOPRAZOLE SODIUM 40 MG PO TBEC
40.0000 mg | DELAYED_RELEASE_TABLET | Freq: Every day | ORAL | Status: DC
  Administered 2021-05-27 – 2021-05-28 (×2): 40 mg via ORAL
  Filled 2021-05-27 (×2): qty 1

## 2021-05-27 NOTE — Progress Notes (Signed)
PROGRESS NOTE  Monica Miranda KGU:[id number] DOB: 09-Mar-1961 DOA: 05/25/2021 PCP: Patient, No Pcp Per (Inactive)   LOS: 2 days   Brief Narrative / Interim history: 60 year old female with history of polysubstance abuse, cocaine, amphetamine, hypertension, hyperlipidemia, COPD who comes into the hospital with worsening left-sided weakness.  She was found to have right intraparenchymal hemorrhage.  Apparently she was admitted in [place] from 12/212/8 with large right thalamic/basal ganglia/frontotemporal intraparenchymal hemorrhage with left-sided weakness.  Her UDS was positive for cocaine and amphetamines at that time, she also has a history of EtOH use.  She was started on hypertonic saline, monitored in the ICU and eventually improved.  Apparently she was recommended to go to rehab however refused.  Upon noticing worsening left-sided weakness she was brought back to the hospital.  Imaging shows stable hemorrhage when compared to her [place] hospitalization  Subjective / 24h Interval events: Sleepy, but overall doing well.  No specific complaints.  Assessment & Plan: Principal Problem Recent right thalamic/basal ganglia/frontal temporal ICH-this is in the setting of hypertension, cocaine and amphetamine use.  Current imaging does not show any new hemorrhage but it does show the ones that she initially presented with now resolved.  CT scan and MRI shows subacute large right parenchymal hemorrhage with mild cytotoxic edema and mild right to left midline shift of 5 mm which is a lot improved compared to her initial CT in [place] reportedly stated 5 cm hemorrhage with 10 mm right-to-left shift.  There is a right thalamic hypodensity, query met.  Neurology consulted and following patient.  MRI was unremarkable.  Carotid Dopplers without significant stenosis.  LDL was 91.  2D echo pending.  Therapy recommends CIR.  Maintain systolic BP less than 628.  Continue Keppra  Active Problems Essential  hypertension-continue lisinopril, apparently not compliant with medications at home  Hyperlipidemia-LDL 91, hold statin given ICH  Polysubstance abuse-cocaine, amphetamine, EtOH.  Will consult when more awake  History of COPD-stable, no wheezing  Tobacco use-nicotine patch  Scheduled Meds:   stroke: mapping our early stages of recovery book   Does not apply Once   Chlorhexidine Gluconate Cloth  6 each Topical Q0600   levETIRAcetam  500 mg Oral Q12H   lisinopril  2.5 mg Oral Daily   melatonin  3 mg Oral QHS   pantoprazole (PROTONIX) IV  40 mg Intravenous QHS   senna-docusate  1 tablet Oral BID   Continuous Infusions: PRN Meds:.acetaminophen **OR** acetaminophen (TYLENOL) oral liquid 160 mg/5 mL **OR** acetaminophen, ondansetron (ZOFRAN) IV  Diet Orders (From admission, onward)     Start     Ordered   05/26/21 1056  Diet Heart Room service appropriate? Yes; Fluid consistency: Thin  Diet effective now       Question Answer Comment  Room service appropriate? Yes   Fluid consistency: Thin      05/26/21 1055            DVT prophylaxis: SCD's Start: 05/25/21 2131     Code Status: Partial Code  Family Communication: no family at bedside   Status is: Inpatient  Remains inpatient appropriate because: monitoring, CIR pending  Level of care: Med-Surg  Consultants:  Neurology   Procedures:  2D echo  Microbiology  none  Antimicrobials: none    Objective: Vitals:   05/27/21 0300 05/27/21 0400 05/27/21 0500 05/27/21 0600  BP: (!) 145/102 124/76 116/68 116/80  Pulse: 65 61 (!) 56 68  Resp: (!) 21 12    Temp:  TempSrc:      SpO2: 97% 96% 98% 97%   No intake or output data in the 24 hours ending 05/27/21 0832 There were no vitals filed for this visit.  Examination:  Constitutional: NAD Eyes: no scleral icterus ENMT: Mucous membranes are moist.  Neck: normal, supple Respiratory: clear to auscultation bilaterally, no wheezing, no crackles. Normal  respiratory effort.  Cardiovascular: Regular rate and rhythm, no murmurs / rubs / gallops. No LE edema.  Abdomen: non distended, no tenderness. Bowel sounds positive.  Musculoskeletal: no clubbing / cyanosis.  Skin: no rashes Neurologic: Left hemiplegia, no new focal deficits   Data Reviewed: I have independently reviewed following labs and imaging studies   CBC: Recent Labs  Lab 05/25/21 1645  WBC 7.7  NEUTROABS 5.1  HGB 14.6  HCT 44.2  MCV 86.3  PLT 947   Basic Metabolic Panel: Recent Labs  Lab 05/25/21 1645 05/26/21 0457 05/27/21 0109  NA 139 137 136  K 3.7  --   --   CL 108  --   --   CO2 25  --   --   GLUCOSE 92  --   --   BUN 15  --   --   CREATININE 0.50  --   --   CALCIUM 9.2  --   --    Liver Function Tests: Recent Labs  Lab 05/25/21 1645  AST 36  ALT 39  ALKPHOS 138*  BILITOT 0.4  PROT 6.0*  ALBUMIN 3.5   Coagulation Profile: Recent Labs  Lab 05/25/21 1741  INR 0.9   HbA1C: No results for input(s): HGBA1C in the last 72 hours. CBG: No results for input(s): GLUCAP in the last 168 hours.  Recent Results (from the past 240 hour(s))  Resp Panel by RT-PCR (Flu A&B, Covid) Nasopharyngeal Swab     Status: None   Collection Time: 05/25/21  5:41 PM   Specimen: Nasopharyngeal Swab; Nasopharyngeal(NP) swabs in vial transport medium  Result Value Ref Range Status   SARS Coronavirus 2 by RT PCR NEGATIVE NEGATIVE Final    Comment: (NOTE) SARS-CoV-2 target nucleic acids are NOT DETECTED.  The SARS-CoV-2 RNA is generally detectable in upper respiratory specimens during the acute phase of infection. The lowest concentration of SARS-CoV-2 viral copies this assay can detect is 138 copies/mL. A negative result does not preclude SARS-Cov-2 infection and should not be used as the sole basis for treatment or other patient management decisions. A negative result may occur with  improper specimen collection/handling, submission of specimen other than  nasopharyngeal swab, presence of viral mutation(s) within the areas targeted by this assay, and inadequate number of viral copies(<138 copies/mL). A negative result must be combined with clinical observations, patient history, and epidemiological information. The expected result is Negative.  Fact Sheet for Patients:  EntrepreneurPulse.com.au  Fact Sheet for Healthcare Providers:  IncredibleEmployment.be  This test is no t yet approved or cleared by the [place] FDA and  has been authorized for detection and/or diagnosis of SARS-CoV-2 by FDA under an Emergency Use Authorization (EUA). This EUA will remain  in effect (meaning this test can be used) for the duration of the COVID-19 declaration under Section 564(b)(1) of the Act, 21 U.S.C.section 360bbb-3(b)(1), unless the authorization is terminated  or revoked sooner.       Influenza A by PCR NEGATIVE NEGATIVE Final   Influenza B by PCR NEGATIVE NEGATIVE Final    Comment: (NOTE) The Xpert Xpress SARS-CoV-2/FLU/RSV plus assay is intended as an  aid in the diagnosis of influenza from Nasopharyngeal swab specimens and should not be used as a sole basis for treatment. Nasal washings and aspirates are unacceptable for Xpert Xpress SARS-CoV-2/FLU/RSV testing.  Fact Sheet for Patients: EntrepreneurPulse.com.au  Fact Sheet for Healthcare Providers: IncredibleEmployment.be  This test is not yet approved or cleared by the [place] FDA and has been authorized for detection and/or diagnosis of SARS-CoV-2 by FDA under an Emergency Use Authorization (EUA). This EUA will remain in effect (meaning this test can be used) for the duration of the COVID-19 declaration under Section 564(b)(1) of the Act, 21 U.S.C. section 360bbb-3(b)(1), unless the authorization is terminated or revoked.  Performed at [facility], [address] [place], [place]  [address]   MRSA Next Gen by PCR, Nasal     Status: None   Collection Time: 05/25/21 10:19 PM   Specimen: Nasal Mucosa; Nasal Swab  Result Value Ref Range Status   MRSA by PCR Next Gen NOT DETECTED NOT DETECTED Final    Comment: (NOTE) The GeneXpert MRSA Assay (FDA approved for NASAL specimens only), is one component of a comprehensive MRSA colonization surveillance program. It is not intended to diagnose MRSA infection nor to guide or monitor treatment for MRSA infections. Test performance is not FDA approved in patients less than 69 years old. Performed at [facility], [place] [address], [place], [place] [address]      Radiology Studies: MR BRAIN W WO CONTRAST  Result Date: 05/26/2021 CLINICAL DATA:  Acute neuro deficit, acute, stroke suspected. Follow-up intracranial hemorrhage. EXAM: MRI HEAD WITHOUT AND WITH CONTRAST TECHNIQUE: Multiplanar, multiecho pulse sequences of the brain and surrounding structures were obtained without and with intravenous contrast. CONTRAST:  48mL GADAVIST GADOBUTROL 1 MMOL/ML IV SOLN COMPARISON:  MRI earlier same day.  Head CT yesterday. FINDINGS: Brain: Redemonstration of an intraparenchymal hematoma on the right measuring 6.3 x 5.1 x 3.4 cm (volume = 57 cm^3), not visibly changed since the immediate prior exam. Small region of nonhemorrhagic acute infarction noted by diffusion imaging at the posterior frontal lobe superior to the hematoma. The hematoma is primarily localized in the right temporal and parietal region with medial extension into the right thalamus. Surrounding vasogenic edema appears similar. Mass-effect is similar with right-to-left shift of 5 mm. Gyriform enhancement occurs within the right posterior frontal and parietal region consistent with subacute ischemic insult. Elsewhere, there are chronic small-vessel ischemic changes of the pons and cerebral hemispheric white matter. No evidence of hydrocephalus or extra-axial collection. Left lateral  ventricle is larger than the right, probably because of mass effect upon the right ventricle. Ventricular trapping is less likely, particularly given this amount mass effect. Vascular: Major vessels at the base of the brain show flow. Skull and upper cervical spine: Negative Sinuses/Orbits: Clear/normal Other: None IMPRESSION: No change or progression since the prior study. 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 Degree of regional edema and swelling is similar with mass effect resulting in 5 mm of right-to-left shift. Ventricular size is stable. Postcontrast imaging shows gyriform enhancement in the frontoparietal region consistent with subacute infarction. Electronically Signed   By: Nelson Chimes M.D.   On: 05/26/2021 12:31   VAS US CAROTID  Result Date: 05/26/2021 Carotid Arterial Duplex Study Patient Name:  Cathren Laine  Date of Exam:   05/26/2021 Medical Rec #: [id number]        Accession #:    [id number] Date of Birth: April 15, 1961  Patient Gender: F Patient Age:   85 years Exam Location:  [facility] Procedure:      VAS US CAROTID Referring Phys: Alferd Patee [facility] --------------------------------------------------------------------------------  Indications:  CVA. Risk Factors: None. Limitations   Today's exam was limited due to the patient's inability or               unwillingness to cooperate. Performing Technologist: Maudry Mayhew MHA, RDMS, RVT, RDCS  Examination Guidelines: A complete evaluation includes B-mode imaging, spectral Doppler, color Doppler, and power Doppler as needed of all accessible portions of each vessel. Bilateral testing is considered an integral part of a complete examination. Limited examinations for reoccurring indications may be performed as noted.  Right Carotid Findings: +----------+--------+--------+--------+---------------------+------------------+             PSV cm/s EDV  cm/s Stenosis Plaque Description    Comments            +----------+--------+--------+--------+---------------------+------------------+  CCA Prox   82       21                                                          +----------+--------+--------+--------+---------------------+------------------+  CCA Distal 52       16                smooth and                                                                       heterogenous                              +----------+--------+--------+--------+---------------------+------------------+  ICA Prox   51       21                heterogenous, smooth                                                             and calcific                              +----------+--------+--------+--------+---------------------+------------------+  ICA Distal 61       26                                                          +----------+--------+--------+--------+---------------------+------------------+  ECA        67       15  intimal thickening  +----------+--------+--------+--------+---------------------+------------------+ +----------+--------+-------+----------------+-------------------+             PSV cm/s EDV cms Describe         Arm Pressure (mmHG)  +----------+--------+-------+----------------+-------------------+  Subclavian 113      91      Multiphasic, WNL                      +----------+--------+-------+----------------+-------------------+ +---------+--------+--+--------+--+---------+  Vertebral PSV cm/s 39 EDV cm/s 11 Antegrade  +---------+--------+--+--------+--+---------+  Left Carotid Findings: +----------+--------+--------+--------+--------------------------+--------+             PSV cm/s EDV cm/s Stenosis Plaque Description         Comments  +----------+--------+--------+--------+--------------------------+--------+  CCA Prox   66       24                                                      +----------+--------+--------+--------+--------------------------+--------+  CCA Distal 48       15                irregular and heterogenous           +----------+--------+--------+--------+--------------------------+--------+  ICA Prox   45       20                smooth and heterogenous              +----------+--------+--------+--------+--------------------------+--------+  ICA Distal 56       24                                                     +----------+--------+--------+--------+--------------------------+--------+  ECA        67       20                                                     +----------+--------+--------+--------+--------------------------+--------+ +----------+--------+--------+----------------+-------------------+             PSV cm/s EDV cm/s Describe         Arm Pressure (mmHG)  +----------+--------+--------+----------------+-------------------+  Subclavian 179               Multiphasic, WNL                      +----------+--------+--------+----------------+-------------------+ +---------+--------+--+--------+--+---------+  Vertebral PSV cm/s 37 EDV cm/s 14 Antegrade  +---------+--------+--+--------+--+---------+   Summary: Right Carotid: Velocities in the right ICA are consistent with a 1-39% stenosis. Left Carotid: Velocities in the left ICA are consistent with a 1-39% stenosis. Vertebrals:  Bilateral vertebral arteries demonstrate antegrade flow. Subclavians: Normal flow hemodynamics were seen in bilateral subclavian              arteries. *See table(s) above for measurements and observations.     Preliminary     Marzetta Board, MD, PhD [facility]  Between 7 am - 7 pm I am available, please contact me via Amion (for emergencies) or Securechat (non urgent messages)  Between 7 pm - 7  am I am not available, please contact night coverage MD/APP via Amion

## 2021-05-27 NOTE — Progress Notes (Signed)
°  Echocardiogram 2D Echocardiogram has been performed.  Monica Miranda F 05/27/2021, 11:31 AM

## 2021-05-27 NOTE — Progress Notes (Signed)
Inpatient Rehab Admissions Coordinator Note:   Per therapy recommendations patient was screened for CIR candidacy by Stephania Fragmin, PT. At this time, pt appears to be a potential candidate for CIR. I will place an order for rehab consult for full assessment, per our protocol.  Please contact me any with questions.Estill Dooms, PT, DPT [phone] 05/27/21 9:25 AM

## 2021-05-27 NOTE — Progress Notes (Signed)
SLP Cancellation Note  Patient Details Name: DARION JUHASZ MRN: [id number] DOB: 05/01/1961   Cancelled treatment:       Reason Eval/Treat Not Completed: Patient at procedure or test/unavailable (SLP will follow up.)  Areatha Kalata I. Vear Clock, MS, CCC-SLP Acute Rehabilitation Services Office number [phone] Pager [phone]  Scheryl Marten 05/27/2021, 11:07 AM

## 2021-05-27 NOTE — PMR Pre-admission (Signed)
PMR Admission Coordinator Pre-Admission Assessment  Patient: Monica Miranda is an 60 y.o., female MRN: [id number] DOB: December 08, 1960 Height:   Weight:    Insurance Information HMO:    PPO:      PCP:      IPA:      80/20:      OTHER:  PRIMARY: Uninsured. Referred to [employer]      Policy#:       Subscriber:  CM Name:       Phone#:      Fax#:  Pre-Cert#:       Employer:  Benefits:  Phone #:      Name:  Eff. Date:      Deduct:       Out of Pocket Max:       Life Max:  CIR:       SNF:  Outpatient:      Co-Pay:  Home Health:       Co-Pay:  DME:      Co-Pay:  Providers:  SECONDARY:       Policy#:      Phone#:   Development worker, community:       Phone#:   The [employer] for patients in Inpatient Rehabilitation Facilities with attached Privacy Act [place] Records was provided and verbally reviewed with: N/A  Emergency Contact Information Contact Information     Name Relation Home Work Mobile   Monica Miranda Daughter [phone]     Monica Miranda Daughter   [phone]       Current Medical History  Patient Admitting Diagnosis: CVA   History of Present Illness: Monica Miranda is a 60 year old right-handed female with history of hypertension, hyperlipidemia, COPD/tobacco use as well as polysubstance abuse.  Per chart review patient had been living  alone in [place] for the last 2 years and reportedly independent.  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  Harvey was stable on multiple repeat CTs.  Her drug screen was positive for cocaine and amphetamines as well as a long history of alcohol use.  She was started on hypertonic saline monitor closely in the ICU required nicardipine drip.  She was discharged on salt tablets 2 g every 8 hours with sodium greater than 140 as well as started on  Keppra 500 twice daily for seizure prophylaxis.  She was discharged planning to live with her son and daughter in law in [place] and on the drive from [place] noted increased left-sided weakness as well as altered mental status while driving through [place].  She did not seek attention for that and made it home and  presented to [facility] 05/26/[facility].  A CT of the head as well as cervical spine showed large intraparenchymal hemorrhage involving posterior right frontal lobe moderate amount of surrounding edema and associated mass-effect on the right lateral ventricle and proximally 11 mm right to left midline shift.  No acute traumatic cervical spine pathology.  MRA/MRI large intraparenchymal hemorrhages right frontotemporal region estimated volume 54 mL.  Medial extension to involve right basal ganglia thalamus right cerebral peduncle and midbrain.  No visible underlying lesion or structural abnormality.  Associated regional mass-effect 5 mm right to left shift as well as associated small volume subarachnoid hemorrhage within the adjacent posterior right parietal temporal region.  Echocardiogram with ejection fraction of 60 to 65% no wall motion abnormality.  Admission chemistries were  unremarkable except alkaline phosphatase 138, troponin negative, urine drug screen negative.  She remains on Keppra for seizure prophylaxis.  Tolerating a regular diet.  Therapy evaluations completed due to patient's left-sided weakness decreased functional mobility was admitted for a comprehensive rehab program.  Complete NIHSS TOTAL: 10  Patient's medical record from [facility] has been reviewed by the rehabilitation admission coordinator and physician.  Past Medical History  Past Medical History:  Diagnosis Date   COPD (chronic obstructive pulmonary disease) ([place])    Rib fracture    4 months ago   Stroke [facility])     Has the patient had major surgery during 100 days prior to admission? No  Family History   family  history is not on file.  Current Medications  Current Facility-Administered Medications:     stroke: mapping our early stages of recovery book, , Does not apply, Once, Donnetta Simpers, MD   acetaminophen (TYLENOL) tablet 650 mg, 650 mg, Oral, Q4H PRN, 650 mg at 05/27/21 1056 **OR** acetaminophen (TYLENOL) 160 MG/5ML solution 650 mg, 650 mg, Per Tube, Q4H PRN **OR** acetaminophen (TYLENOL) suppository 650 mg, 650 mg, Rectal, Q4H PRN, Donnetta Simpers, MD   Chlorhexidine Gluconate Cloth 2 % PADS 6 each, 6 each, Topical, Q0600, Donnetta Simpers, MD, 6 each at 05/27/21 1022   levETIRAcetam (KEPPRA) tablet 500 mg, 500 mg, Oral, BID, Gherghe, Costin M, MD   lisinopril (ZESTRIL) tablet 2.5 mg, 2.5 mg, Oral, Daily, Biby, Sharon L, NP, 2.5 mg at 05/27/21 0908   melatonin tablet 3 mg, 3 mg, Oral, QHS, Khaliqdina, Alferd Patee, MD, 3 mg at 05/26/21 2313   ondansetron (ZOFRAN) injection 4 mg, 4 mg, Intravenous, Q6H PRN, Donnetta Simpers, MD   pantoprazole (PROTONIX) EC tablet 40 mg, 40 mg, Oral, QHS, Gherghe, Vella Redhead, MD   senna-docusate (Senokot-S) tablet 1 tablet, 1 tablet, Oral, BID, Donnetta Simpers, MD, 1 tablet at 05/26/21 2313  Patients Current Diet:  Diet Order             Diet Heart Room service appropriate? Yes; Fluid consistency: Thin  Diet effective now                   Precautions / Restrictions Precautions Precautions: Fall Precaution Comments: BP <150 Restrictions Weight Bearing Restrictions: No   Has the patient had 2 or more falls or a fall with injury in the past year? Unknown  Prior Activity Level Community (5-7x/wk): prior to CVA in early Dec was independent without device or limitations, was in [place] for a "job opportunity", but after her CVA was denied AIR 2/2 positive tox screen, has been essentially total care since early december  Prior Functional Level Self Care: Did the patient need help bathing, dressing, using the toilet or eating? Independent prior to  December CVA, assist since December  Indoor Mobility: Did the patient need assistance with walking from room to room (with or without device)? Independent prior to December CVA, assist since December  Stairs: Did the patient need assistance with internal or external stairs (with or without device)? Independent prior to December CVA, assist since December  Functional Cognition: Did the patient need help planning regular tasks such as shopping or remembering to take medications? Independent prior to December CVA, assist since December  Patient Information Are you of Hispanic, Latino/a,or Spanish origin?: A. No, not of Hispanic, Latino/a, or Spanish origin What is your race?: A. White Do you need or want an interpreter to communicate with a doctor or health care staff?: 0.  No  Patient's Response To:  Health Literacy and Transportation Is the patient able to respond to health literacy and transportation needs?: Yes Health Literacy - How often do you need to have someone help you when you read instructions, pamphlets, or other written material from your doctor or pharmacy?: Sometimes In the past 12 months, has lack of transportation kept you from medical appointments or from getting medications?: Yes In the past 12 months, has lack of transportation kept you from meetings, work, or from getting things needed for daily living?: Yes  Home Assistive Devices / Equipment Home Equipment: None  Prior Device Use: Indicate devices/aids used by the patient prior to current illness, exacerbation or injury? Manual wheelchair  Current Functional Level Cognition  Overall Cognitive Status: Impaired/Different from baseline Current Attention Level: Sustained Orientation Level: Oriented X4 Following Commands: Follows one step commands with increased time Safety/Judgement: Decreased awareness of safety General Comments: states that she is in [place] and it's January 2012. Following commands with increased  time but easily distracted by being cold. cues required for redirection.    Extremity Assessment (includes Sensation/Coordination)  Upper Extremity Assessment: LUE deficits/detail LUE Deficits / Details: no AROM noted to L UE.  Increased flexor tone noted to L UE LUE: Shoulder pain with ROM LUE Sensation: decreased light touch LUE Coordination: decreased fine motor, decreased gross motor  Lower Extremity Assessment: Defer to PT evaluation LLE Deficits / Details: grossly 2-/5 LLE Sensation: decreased proprioception LLE Coordination: decreased fine motor, decreased gross motor    ADLs  Overall ADL's : Needs assistance/impaired Eating/Feeding: Minimal assistance, Cueing for compensatory techinques Grooming: Wash/dry hands, Wash/dry face, Minimal assistance, Cueing for compensatory techniques, Sitting Upper Body Bathing: Moderate assistance, Bed level Lower Body Bathing: Total assistance, Bed level Upper Body Dressing : Moderate assistance, Sitting Lower Body Dressing: Total assistance, Bed level Toilet Transfer: Moderate assistance, +2 for physical assistance    Mobility  Overal bed mobility: Needs Assistance Bed Mobility: Supine to Sit Supine to sit: Mod assist General bed mobility comments: ModA bringing L LE off bed and trunk elevation    Transfers  Overall transfer level: Needs assistance Equipment used: 2 person hand held assist Transfers: Sit to/from Stand, Bed to chair/wheelchair/BSC Sit to Stand: Mod assist, +2 physical assistance Bed to/from chair/wheelchair/BSC transfer type:: Step pivot Step pivot transfers: Mod assist, +2 physical assistance General transfer comment: modA+2 for sit to stand from EOB. Blocking L knee and cues required for sequencing. Patient able to take steps towards chair but modA+2 to maintain standing balance and cues for sequencing    Ambulation / Gait / Stairs / Wheelchair Mobility       Posture / Balance Balance Overall balance assessment:  Needs assistance Sitting-balance support: Single extremity supported, Feet supported Sitting balance-Leahy Scale: Fair Standing balance support: Bilateral upper extremity supported Standing balance-Leahy Scale: Poor Standing balance comment: reliant on UE support and external assist    Special needs/care consideration N/a   Previous Home Environment (from acute therapy documentation) Living Arrangements: Children Available Help at Discharge: Family, Available 24 hours/day Type of Home: House Home Layout: One level Home Access: Stairs to enter Entrance Stairs-Rails: Right, Left Entrance Stairs-Number of Steps: 2 Bathroom Shower/Tub: Chiropodist: Standard  Discharge Living Setting Plans for Discharge Living Setting: Lives with (comment) (son and daughter in law) Type of Home at Discharge: House Discharge Home Layout: One level Discharge Home Access: Stairs to enter Entrance Stairs-Rails: None Entrance Stairs-Number of Steps: 2 Discharge Bathroom Shower/Tub: Tub/shower unit  Discharge Bathroom Toilet: Standard Discharge Bathroom Accessibility: Yes How Accessible: Accessible via walker Does the patient have any problems obtaining your medications?: Yes (Describe) (uninsured)  Social/Family/Support Systems Anticipated Caregiver: daughter in Production assistant, radio (primary) and daughter Jonelle Sidle), and sister Hospital doctor) Anticipated Caregiver's Contact Information: Leafy Ro [phone]; [name] [phone] Ability/Limitations of Caregiver: has been providing significant assist this whole month, ideally would provide min assist for mobility and ADLs Caregiver Availability: 24/7 Discharge Plan Discussed with Primary Caregiver: Yes Is Caregiver In Agreement with Plan?: Yes Does Caregiver/Family have Issues with Lodging/Transportation while Pt is in Rehab?: No  Goals Patient/Family Goal for Rehab: PT/OT min assist, SLP supervision to min assist Expected length of stay: 14-18  days Additional Information: recent CVA in December while she was in [place]; she was denied rehab admission at that time so moved home with her family in [place]; they have been providing care for her since that time. Pt/Family Agrees to Admission and willing to participate: Yes Program Orientation Provided & Reviewed with Pt/Caregiver Including Roles  & Responsibilities: Yes  Barriers to Discharge: Insurance for SNF coverage  Decrease burden of Care through IP rehab admission: n/a  Possible need for SNF placement upon discharge: not anticipated  Patient Condition: I have reviewed medical records from [facility], spoken with CM, and patient and family member. I met with patient at the bedside for inpatient rehabilitation assessment.  Patient will benefit from ongoing PT, OT, and SLP, can actively participate in 3 hours of therapy a day 5 days of the week, and can make measurable gains during the admission.  Patient will also benefit from the coordinated team approach during an Inpatient Acute Rehabilitation admission.  The patient will receive intensive therapy as well as Rehabilitation physician, nursing, social worker, and care management interventions.  Due to bladder management, bowel management, safety, skin/wound care, disease management, medication administration, pain management, and patient education the patient requires 24 hour a day rehabilitation nursing.  The patient is currently mod to max assist with mobility and basic ADLs.  Discharge setting and therapy post discharge at  home  is anticipated.  Patient has agreed to participate in the Acute Inpatient Rehabilitation Program and will admit today.  Preadmission Screen Completed By:  Michel Santee, PT, DPT 05/27/2021 4:03 PM ______________________________________________________________________   Discussed status with Dr. Naaman Plummer on 05/28/21  at 12:06 PM  and received approval for admission today.  Admission Coordinator:  Michel Santee, PT,  DPT time 12:06 PM Sudie Grumbling 05/28/21    Assessment/Plan: Diagnosis: CVA Does the need for close, 24 hr/day Medical supervision in concert with the patient's rehab needs make it unreasonable for this patient to be served in a less intensive setting? Yes Co-Morbidities requiring supervision/potential complications: polysubstance abuse, obstipation,  Due to bladder management, bowel management, safety, skin/wound care, disease management, medication administration, pain management, and patient education, does the patient require 24 hr/day rehab nursing? Yes Does the patient require coordinated care of a physician, rehab nurse, PT, OT, and SLP to address physical and functional deficits in the context of the above medical diagnosis(es)? Yes Addressing deficits in the following areas: balance, endurance, locomotion, strength, transferring, bowel/bladder control, bathing, dressing, feeding, grooming, toileting, cognition, and psychosocial support Can the patient actively participate in an intensive therapy program of at least 3 hrs of therapy 5 days a week? Yes The potential for patient to make measurable gains while on inpatient rehab is excellent Anticipated functional outcomes upon discharge from inpatient rehab: min assist PT, min assist OT, supervision  and min assist SLP Estimated rehab length of stay to reach the above functional goals is: 18-24 days Anticipated discharge destination: Home 10. Overall Rehab/Functional Prognosis: excellent   MD Signature: Meredith Staggers, MD, [place] Director Rehabilitation Services 05/29/2021

## 2021-05-27 NOTE — Progress Notes (Signed)
PT Cancellation Note  Patient Details Name: Monica Miranda MRN: [id number] DOB: Apr 07, 1961   Cancelled Treatment:    Reason Eval/Treat Not Completed: Patient declined, no reason specified Patient refused, stating "I'm too tired. It's bed time. Can you come back tomorrow?" Encouraged OOB mobility but patient refused. PT will re-attempt at later date.   Bentlee [place] A. Dan Humphreys PT, DPT Acute Rehabilitation Services Pager [phone] Office [phone]    Viviann Spare 05/27/2021, 4:26 PM

## 2021-05-27 NOTE — TOC Initial Note (Signed)
Transition of Care [facility]) - Initial/Assessment Note    Patient Details  Name: Monica Miranda MRN: [id number] Date of Birth: Jan 30, 1961  Transition of Care [facility]) CM/SW Contact:    Ella Bodo, RN Phone Number: 05/27/2021, 1:50pm  Clinical Narrative:                 60 y/o female presented to [facility] ED on 12/20 with R IPH with L sided weakness. Was admitted in [place] from 12/2-12/01/2021 with large R thalamic/basal ganglia/frontotemporal IPH.  Prior to admission, patient independent and living at home with daughter.  Family available 24 hours a day at discharge.  PT/OT recommending inpatient rehab, and patient desires rehab.  Met with patient and daughter at bedside; they are interested in applying for Medicaid.  Will refer to [place] for potential Medicaid application, per pt/family request.  Expected Discharge Plan: IP Rehab Facility Barriers to Discharge: Continued Medical Work up   Patient Goals and CMS Choice Patient states their goals for this hospitalization and ongoing recovery are:: to go home/get rehab CMS Medicare.gov Compare Post Acute Care list provided to:: Patient Choice offered to / list presented to : Patient, Adult Children  Expected Discharge Plan and Services Expected Discharge Plan: [place]   Discharge Planning Services: CM Consult Post Acute Care Choice: IP Rehab Living arrangements for the past 2 months: Single Family Home                                      Prior Living Arrangements/Services Living arrangements for the past 2 months: Single Family Home Lives with:: Adult Children Patient language and need for interpreter reviewed:: Yes Do you feel safe going back to the place where you live?: Yes      Need for Family Participation in Patient Care: Yes (Comment) Care giver support system in place?: Yes (comment)   Criminal Activity/Legal Involvement Pertinent to Current Situation/Hospitalization: No - Comment as  needed  Activities of Daily Living      Permission Sought/Granted      Share Information with NAME: [name]     Permission granted to share info w Relationship: daughter  Permission granted to share info w Contact Information: [phone]  Emotional Assessment Appearance:: Appears stated age Attitude/Demeanor/Rapport: Engaged Affect (typically observed): Accepting Orientation: : Oriented to Self, Oriented to Place, Oriented to  Time, Oriented to Situation      Admission diagnosis:  ICH (intracerebral hemorrhage) ([place]) [I61.9] Patient Active Problem List   Diagnosis Date Noted   Pressure injury of skin 05/26/2021   ICH (intracerebral hemorrhage) ([place]) 05/25/2021   PCP:  Patient, No Pcp Per (Inactive) Pharmacy:   [place] ([place]), [place] - [address] [place] ([place]) [place] [address] Phone: [phone] Fax: [place], [place]. [place] [address] Phone: [phone] Fax: [phone]     Social Determinants of Health (SDOH) Interventions    Readmission Risk Interventions No flowsheet data found.  Reinaldo Raddle, RN, BSN  Trauma/Neuro ICU Case Manager [phone]

## 2021-05-27 NOTE — Progress Notes (Signed)
Inpatient Rehab Admissions Coordinator:   Met with patient, her sister, and her daughter in law at the bedside to discuss CIR recommendations and goals of CIR admission.  We discussed that estimated stay would be 14-18 days, dependent upon progress, and that goals would likely require some sort of physical assist from caregivers.  Family is prepared to provide this.  Plan for pt to discharge back to her daughter in [place] home.  We discussed cost of rehab, referral to [facility] [employer] for medicaid screen, and what's likely as far as f/u and DME at discharge without a payor source.  Answered their questions, and will proceed with CIR admit when bed available.   Shann Medal, PT, DPT Admissions Coordinator [phone] 05/27/21  1:17 PM

## 2021-05-28 LAB — COMPREHENSIVE METABOLIC PANEL
ALT: 35 U/L (ref 0–44)
AST: 37 U/L (ref 15–41)
Albumin: 2.8 g/dL — ABNORMAL LOW (ref 3.5–5.0)
Alkaline Phosphatase: 118 U/L (ref 38–126)
Anion gap: 6 (ref 5–15)
BUN: 10 mg/dL (ref 8–23)
CO2: 27 mmol/L (ref 22–32)
Calcium: 8.7 mg/dL — ABNORMAL LOW (ref 8.9–10.3)
Chloride: 105 mmol/L (ref 98–111)
Creatinine, Ser: 0.47 mg/dL (ref 0.44–1.00)
GFR, Estimated: >60 mL/min (ref >60)
Glucose, Bld: 97 mg/dL (ref 70–99)
Potassium: 3.5 mmol/L (ref 3.5–5.1)
Sodium: 138 mmol/L (ref 135–145)
Total Bilirubin: 0.4 mg/dL (ref 0.3–1.2)
Total Protein: 5 g/dL — ABNORMAL LOW (ref 6.5–8.1)

## 2021-05-28 LAB — CBC
HCT: 39.4 % (ref 36.0–46.0)
Hemoglobin: 12.6 g/dL (ref 12.0–15.0)
MCH: 28.1 pg (ref 26.0–34.0)
MCHC: 32 g/dL (ref 30.0–36.0)
MCV: 87.9 fL (ref 80.0–100.0)
Platelets: 249 10*3/uL (ref 150–400)
RBC: 4.48 MIL/uL (ref 3.87–5.11)
RDW: 15.2 % (ref 11.5–15.5)
WBC: 5.1 10*3/uL (ref 4.0–10.5)
nRBC: 0 % (ref 0.0–0.2)

## 2021-05-28 MED ORDER — TRAZODONE HCL 50 MG PO TABS: 25.0000 mg | ORAL_TABLET | Freq: Every evening | ORAL | Status: DC | PRN

## 2021-05-28 MED ORDER — DIPHENHYDRAMINE HCL 25 MG PO CAPS
25.0000 mg | ORAL_CAPSULE | Freq: Three times a day (TID) | ORAL | Status: DC | PRN
  Administered 2021-05-28: 18:00:00 25 mg via ORAL
  Filled 2021-05-28: qty 1

## 2021-05-28 MED ORDER — NICOTINE 21 MG/24HR TD PT24
21.0000 mg | MEDICATED_PATCH | Freq: Every day | TRANSDERMAL | Status: DC
  Administered 2021-05-28 – 2021-05-29 (×2): 21 mg via TRANSDERMAL
  Filled 2021-05-28 (×2): qty 1

## 2021-05-28 MED ORDER — TRAMADOL HCL 50 MG PO TABS
50.0000 mg | ORAL_TABLET | Freq: Four times a day (QID) | ORAL | Status: DC | PRN
  Administered 2021-05-28 (×2): 50 mg via ORAL
  Filled 2021-05-28 (×2): qty 1

## 2021-05-28 NOTE — Progress Notes (Signed)
Occupational Therapy Treatment Patient Details Name: Monica Miranda MRN: [id number] DOB: 05/07/1961 Today's Date: 05/28/2021   History of present illness 60 y/o female presented to [facility] ED on 12/20 with R IPH with L sided weakness. Was admitted in [place] from 12/2-12/01/2021 with large R thalamic/basal ganglia/frontotemporal IPH and drug screen positive for cocaine and amphetamines. Transferred to [facility]. MRI showed large IPH centered at frontotemporal region, estimated volume 54 mL with medial extension in to R basal ganglia, thalamus, R cerebral peduncle and midbrain. PMH: COPD, rib fracture, polysubstance abuse.   OT comments  Patient with fair gains toward patient focused goals.  Improved tracking noted, improved AROM to L arm and leg.  Patient able to sit up with closer to Min A this date.  +2 for transfers due to decreased activation of L leg.  Continues with deficits noted below.  Cueing for orientation, but able to described some of her deficits.  OT to continue in the acute setting to progress functional abilities.  AIR continues to be recommended for post acute rehab prior to home given age, ability to advance and motivation.     Recommendations for follow up therapy are one component of a multi-disciplinary discharge planning process, led by the attending physician.  Recommendations may be updated based on patient status, additional functional criteria and insurance authorization.    Follow Up Recommendations  Acute inpatient rehab (3hours/day)    Assistance Recommended at Discharge Frequent or constant Supervision/Assistance  Equipment Recommendations  BSC/3in1;Wheelchair (measurements OT);Wheelchair cushion (measurements OT)    Recommendations for Other Services      Precautions / Restrictions Precautions Precautions: Fall Precaution Comments: BP <150 Restrictions Weight Bearing Restrictions: No       Mobility Bed Mobility Overal bed mobility: Needs Assistance Bed  Mobility: Supine to Sit     Supine to sit: Min assist     General bed mobility comments: Min A bringing L LE off bed and trunk elevation    Transfers Overall transfer level: Needs assistance Equipment used: 2 person hand held assist Transfers: Sit to/from Stand;Bed to chair/wheelchair/BSC Sit to Stand: Min assist;+2 physical assistance   Step pivot transfers: Mod assist;+2 physical assistance             Balance Overall balance assessment: Needs assistance Sitting-balance support: Single extremity supported;Feet supported Sitting balance-Leahy Scale: Fair     Standing balance support: Bilateral upper extremity supported Standing balance-Leahy Scale: Poor Standing balance comment: reliant on UE support and external assist                           ADL either performed or assessed with clinical judgement   ADL   Eating/Feeding: Minimal assistance;Cueing for compensatory techinques;Sitting   Grooming: Wash/dry hands;Wash/dry face;Minimal assistance;Cueing for compensatory techniques;Sitting           Upper Body Dressing : Moderate assistance;Sitting   Lower Body Dressing: Total assistance;Bed level                      Extremity/Trunk Assessment Upper Extremity Assessment LUE Deficits / Details: improved AROM to shoulder and elbow - trace noted. LUE Sensation: decreased light touch;decreased proprioception LUE Coordination: decreased fine motor;decreased gross motor   Lower Extremity Assessment Lower Extremity Assessment: Defer to PT evaluation   Cervical / Trunk Assessment Cervical / Trunk Assessment: Normal    Vision   Additional Comments: continues with L field cut and decreased attention/neglect to L visual field.  Improved tracking and smoother pursuits noted.  Barely crossing midline with gaze, but improved.   Perception Perception Perception: Impaired Inattention/Neglect: Does not attend to left visual field;Does not attend to  left side of body   Praxis      Cognition Arousal/Alertness: Awake/alert Behavior During Therapy: WFL for tasks assessed/performed Overall Cognitive Status: Impaired/Different from baseline Area of Impairment: Orientation;Attention;Memory;Following commands;Safety/judgement;Awareness;Problem solving                 Orientation Level: Disoriented to;Place;Time;Situation Current Attention Level: Sustained Memory: Decreased recall of precautions;Decreased short-term memory Following Commands: Follows one step commands with increased time Safety/Judgement: Decreased awareness of safety Awareness: Emergent Problem Solving: Slow processing;Difficulty sequencing;Requires verbal cues            Exercises     Shoulder Instructions       General Comments      Pertinent Vitals/ Pain       Pain Assessment: Faces Faces Pain Scale: Hurts little more Pain Location: neck and back Pain Descriptors / Indicators: Discomfort;Grimacing Pain Intervention(s): Monitored during session  Home Living                                          Prior Functioning/Environment              Frequency  Min 2X/week        Progress Toward Goals  OT Goals(current goals can now be found in the care plan section)  Progress towards OT goals: Progressing toward goals  Acute Rehab OT Goals Patient Stated Goal: I want to go to rehab OT Goal Formulation: With patient Time For Goal Achievement: 06/09/21 Potential to Achieve Goals: Good  Plan Discharge plan remains appropriate    Co-evaluation    PT/OT/SLP Co-Evaluation/Treatment: Yes Reason for Co-Treatment: Necessary to address cognition/behavior during functional activity;For patient/therapist safety;To address functional/ADL transfers   OT goals addressed during session: ADL's and self-care      AM-PAC OT "6 Clicks" Daily Activity     Outcome Measure   Help from another person eating meals?: A Little Help  from another person taking care of personal grooming?: A Little Help from another person toileting, which includes using toliet, bedpan, or urinal?: A Lot Help from another person bathing (including washing, rinsing, drying)?: A Lot Help from another person to put on and taking off regular upper body clothing?: A Lot Help from another person to put on and taking off regular lower body clothing?: Total 6 Click Score: 13    End of Session Equipment Utilized During Treatment: Gait belt  OT Visit Diagnosis: Unsteadiness on feet (R26.81);Other abnormalities of gait and mobility (R26.89);Muscle weakness (generalized) (M62.81);Other symptoms and signs involving cognitive function;Hemiplegia and hemiparesis;Low vision, both eyes (H54.2) Hemiplegia - Right/Left: Left   Activity Tolerance Patient tolerated treatment well   Patient Left in chair;with call bell/phone within reach;with chair alarm set;with family/visitor present   Nurse Communication          Time: [phone] OT Time Calculation (min): 30 min  Charges: OT General Charges $OT Visit: 1 Visit OT Treatments $Self Care/Home Management : 8-22 mins  05/28/2021  RP, OTR/L  Acute Rehabilitation Services  Office:  [phone]   Suzanna Obey 05/28/2021, 9:23 AM

## 2021-05-28 NOTE — Progress Notes (Signed)
Inpatient Rehab Admissions Coordinator:   I have a bed for this pt to admit to CIR on Saturday (12/24).  Dr. Elvera Lennox in agreement.  Rehab MD (Dr. Riley Kill) to assess pt and confirm admission on Saturday.  Floor RN can call CIR at [phone] for report after 12pm on Saturday.  I have let pt/family and case manager know.    Estill Dooms, PT, DPT Admissions Coordinator [phone] 05/28/21  12:05 PM

## 2021-05-28 NOTE — Progress Notes (Signed)
Physical Therapy Treatment Patient Details Name: Monica Miranda MRN: [id number] DOB: May 09, 1961 Today's Date: 05/28/2021   History of Present Illness 60 y/o female presented to [facility] ED on 12/20 with R IPH with L sided weakness. Was admitted in [place] from 12/2-12/01/2021 with large R thalamic/basal ganglia/frontotemporal IPH and drug screen positive for cocaine and amphetamines. Transferred to [facility] MRI showed large IPH centered at frontotemporal region, estimated volume 54 mL with medial extension in to R basal ganglia, thalamus, R cerebral peduncle and midbrain. PMH: COPD, rib fracture, polysubstance abuse.    PT Comments    Patient progressing towards physical therapy goals. Patient continues to require modA+2 for step pivot transfer with HHAx2. Patient with emerging awareness of situation, however fluctuating during session. Patient requires constant cueing for scanning L environment. Patient reporting "I'm confused" after transfer to recliner. Continue to recommend CIR level therapies to assist with maximizing functional mobility and safety.     Recommendations for follow up therapy are one component of a multi-disciplinary discharge planning process, led by the attending physician.  Recommendations may be updated based on patient status, additional functional criteria and insurance authorization.  Follow Up Recommendations  Acute inpatient rehab (3hours/day)     Assistance Recommended at Discharge Frequent or constant Supervision/Assistance  Equipment Recommendations  Other (comment) (TBD)    Recommendations for Other Services       Precautions / Restrictions Precautions Precautions: Fall Precaution Comments: BP <150 Restrictions Weight Bearing Restrictions: No     Mobility  Bed Mobility Overal bed mobility: Needs Assistance Bed Mobility: Supine to Sit     Supine to sit: Min assist     General bed mobility comments: Min A bringing L LE off bed and trunk elevation     Transfers Overall transfer level: Needs assistance Equipment used: 2 person hand held assist Transfers: Sit to/from Stand;Bed to chair/wheelchair/BSC Sit to Stand: Min assist;+2 physical assistance     Step pivot transfers: Mod assist;+2 physical assistance     General transfer comment: modA+2 to perform step pivot transfer. Patient requiring assist for advancing L LE towards recliner due to sensation and strength deficits. Cues for sequencing and benefits from step by step instructions prior to mobilizing    Ambulation/Gait                   Stairs             Wheelchair Mobility    Modified Rankin (Stroke Patients Only) Modified Rankin (Stroke Patients Only) Pre-Morbid Rankin Score: No significant disability Modified Rankin: Severe disability     Balance Overall balance assessment: Needs assistance Sitting-balance support: Single extremity supported;Feet supported Sitting balance-Leahy Scale: Fair     Standing balance support: Bilateral upper extremity supported Standing balance-Leahy Scale: Poor Standing balance comment: reliant on UE support and external assist                            Cognition Arousal/Alertness: Awake/alert Behavior During Therapy: WFL for tasks assessed/performed Overall Cognitive Status: Impaired/Different from baseline Area of Impairment: Orientation;Attention;Memory;Following commands;Safety/judgement;Awareness;Problem solving                 Orientation Level: Disoriented to;Time Current Attention Level: Sustained Memory: Decreased recall of precautions;Decreased short-term memory Following Commands: Follows one step commands with increased time Safety/Judgement: Decreased awareness of safety Awareness: Emergent Problem Solving: Slow processing;Difficulty sequencing;Requires verbal cues General Comments: consistent with "January 2012". Following commands with increased time. Emerging awareness  about  situation but fluctuating throughout session        Exercises      General Comments        Pertinent Vitals/Pain Pain Assessment: Faces Faces Pain Scale: Hurts little more Pain Location: neck and back Pain Descriptors / Indicators: Discomfort;Grimacing Pain Intervention(s): Monitored during session    Home Living                          Prior Function            PT Goals (current goals can now be found in the care plan section) Acute Rehab PT Goals PT Goal Formulation: With patient/family Time For Goal Achievement: 06/09/21 Potential to Achieve Goals: Good Progress towards PT goals: Progressing toward goals    Frequency    Min 4X/week      PT Plan Current plan remains appropriate    Co-evaluation PT/OT/SLP Co-Evaluation/Treatment: Yes Reason for Co-Treatment: Necessary to address cognition/behavior during functional activity;For patient/therapist safety;To address functional/ADL transfers PT goals addressed during session: Mobility/safety with mobility;Balance OT goals addressed during session: ADL's and self-care      AM-PAC PT "6 Clicks" Mobility   Outcome Measure  Help needed turning from your back to your side while in a flat bed without using bedrails?: A Little Help needed moving from lying on your back to sitting on the side of a flat bed without using bedrails?: A Little Help needed moving to and from a bed to a chair (including a wheelchair)?: Total Help needed standing up from a chair using your arms (e.g., wheelchair or bedside chair)?: Total Help needed to walk in hospital room?: Total Help needed climbing 3-5 steps with a railing? : Total 6 Click Score: 10    End of Session Equipment Utilized During Treatment: Gait belt Activity Tolerance: Patient tolerated treatment well Patient left: in chair;with call bell/phone within reach;with chair alarm set Nurse Communication: Mobility status PT Visit Diagnosis: Unsteadiness on feet  (R26.81);Muscle weakness (generalized) (M62.81);Difficulty in walking, not elsewhere classified (R26.2);Hemiplegia and hemiparesis Hemiplegia - Right/Left: Left Hemiplegia - dominant/non-dominant: Non-dominant Hemiplegia - caused by: Nontraumatic intracerebral hemorrhage     Time: [id number] PT Time Calculation (min) (ACUTE ONLY): 24 min  Charges:  $Therapeutic Activity: 8-22 mins                     Damica Gravlin A. Dan Humphreys PT, DPT Acute Rehabilitation Services Pager [phone] Office [phone]    Viviann Spare 05/28/2021, 10:00 AM

## 2021-05-28 NOTE — Progress Notes (Signed)
PROGRESS NOTE  Monica Miranda QBH:[id number] DOB: February 17, 1961 DOA: 05/25/2021 PCP: Patient, No Pcp Per (Inactive)   LOS: 3 days   Brief Narrative / Interim history: 60 year old female with history of polysubstance abuse, cocaine, amphetamine, hypertension, hyperlipidemia, COPD who comes into the hospital with worsening left-sided weakness.  She was found to have right intraparenchymal hemorrhage.  Apparently she was admitted in [place] from 12/212/8 with large right thalamic/basal ganglia/frontotemporal intraparenchymal hemorrhage with left-sided weakness.  Her UDS was positive for cocaine and amphetamines at that time, she also has a history of EtOH use.  She was started on hypertonic saline, monitored in the ICU and eventually improved.  Apparently she was recommended to go to rehab however refused.  Upon noticing worsening left-sided weakness she was brought back to the hospital.  Imaging shows stable hemorrhage when compared to her [place] hospitalization  Subjective / 24h Interval events: Doing well, no complaints. Sister is at bedside   Assessment & Plan: Principal Problem Recent right thalamic/basal ganglia/frontal temporal ICH-this is in the setting of hypertension, cocaine and amphetamine use.  Current imaging does not show any new hemorrhage but it does show the ones that she initially presented with now resolved.  CT scan and MRI shows subacute large right parenchymal hemorrhage with mild cytotoxic edema and mild right to left midline shift of 5 mm which is a lot improved compared to her initial CT in [place] reportedly stated 5 cm hemorrhage with 10 mm right-to-left shift.  There is a right thalamic hypodensity, query met.  Neurology consulted and following patient.  MRI was unremarkable.  Carotid Dopplers without significant stenosis.  LDL was 91.  2D echo showed EF 60-65%.  Therapy recommends CIR, bed likely to be available on Saturday.  Maintain systolic BP less than 097.   Continue Keppra  Active Problems Essential hypertension-continue lisinopril, apparently not compliant with medications at home  Hyperlipidemia-LDL 91, hold statin given ICH  Polysubstance abuse-cocaine, amphetamine, EtOH.  Counseled for cessation  History of COPD-stable, no wheezing  Tobacco use-nicotine patch  Scheduled Meds:   stroke: mapping our early stages of recovery book   Does not apply Once   Chlorhexidine Gluconate Cloth  6 each Topical Q0600   levETIRAcetam  500 mg Oral BID   lisinopril  2.5 mg Oral Daily   melatonin  3 mg Oral QHS   nicotine  21 mg Transdermal Daily   pantoprazole  40 mg Oral QHS   senna-docusate  1 tablet Oral BID   Continuous Infusions: PRN Meds:.acetaminophen **OR** acetaminophen (TYLENOL) oral liquid 160 mg/5 mL **OR** acetaminophen, ondansetron (ZOFRAN) IV, traMADol, traZODone  Diet Orders (From admission, onward)     Start     Ordered   05/26/21 1056  Diet Heart Room service appropriate? Yes; Fluid consistency: Thin  Diet effective now       Question Answer Comment  Room service appropriate? Yes   Fluid consistency: Thin      05/26/21 1055            DVT prophylaxis: SCD's Start: 05/25/21 2131     Code Status: Partial Code  Family Communication: stister at bedside   Status is: Inpatient  Remains inpatient appropriate because: monitoring, CIR pending  Level of care: Med-Surg  Consultants:  Neurology   Procedures:  2D echo  Microbiology  none  Antimicrobials: none    Objective: Vitals:   05/28/21 0400 05/28/21 0500 05/28/21 0600 05/28/21 0800  BP: (!) 133/96 121/73 136/80 (!) 148/86  Pulse: 67  84 75 69  Resp: 13 13 (!) 29 14  Temp: 98 F (36.7 C)   99.8 F (37.7 C)  TempSrc: Oral   Oral  SpO2: 96% 95% 95% 97%   No intake or output data in the 24 hours ending 05/28/21 1155 There were no vitals filed for this visit.  Examination:  Constitutional: NAD Eyes: anicteric ENMT: mmm Neck: normal,  supple Respiratory: CTA biL, no wheezing, no crackles Cardiovascular: rrr, no mrg, no edema Abdomen: soft, nt, nd, bs+ Musculoskeletal: no clubbing / cyanosis.  Skin: no rashes Neurologic: Left hemiplegia, no new focal deficits   Data Reviewed: I have independently reviewed following labs and imaging studies   CBC: Recent Labs  Lab 05/25/21 1645 05/28/21 0319  WBC 7.7 5.1  NEUTROABS 5.1  --   HGB 14.6 12.6  HCT 44.2 39.4  MCV 86.3 87.9  PLT 384 003    Basic Metabolic Panel: Recent Labs  Lab 05/25/21 1645 05/26/21 0457 05/27/21 0109 05/28/21 0319  NA 139 137 136 138  K 3.7  --   --  3.5  CL 108  --   --  105  CO2 25  --   --  27  GLUCOSE 92  --   --  97  BUN 15  --   --  10  CREATININE 0.50  --   --  0.47  CALCIUM 9.2  --   --  8.7*    Liver Function Tests: Recent Labs  Lab 05/25/21 1645 05/28/21 0319  AST 36 37  ALT 39 35  ALKPHOS 138* 118  BILITOT 0.4 0.4  PROT 6.0* 5.0*  ALBUMIN 3.5 2.8*    Coagulation Profile: Recent Labs  Lab 05/25/21 1741  INR 0.9    HbA1C: No results for input(s): HGBA1C in the last 72 hours. CBG: No results for input(s): GLUCAP in the last 168 hours.  Recent Results (from the past 240 hour(s))  Resp Panel by RT-PCR (Flu A&B, Covid) Nasopharyngeal Swab     Status: None   Collection Time: 05/25/21  5:41 PM   Specimen: Nasopharyngeal Swab; Nasopharyngeal(NP) swabs in vial transport medium  Result Value Ref Range Status   SARS Coronavirus 2 by RT PCR NEGATIVE NEGATIVE Final    Comment: (NOTE) SARS-CoV-2 target nucleic acids are NOT DETECTED.  The SARS-CoV-2 RNA is generally detectable in upper respiratory specimens during the acute phase of infection. The lowest concentration of SARS-CoV-2 viral copies this assay can detect is 138 copies/mL. A negative result does not preclude SARS-Cov-2 infection and should not be used as the sole basis for treatment or other patient management decisions. A negative result may occur  with  improper specimen collection/handling, submission of specimen other than nasopharyngeal swab, presence of viral mutation(s) within the areas targeted by this assay, and inadequate number of viral copies(<138 copies/mL). A negative result must be combined with clinical observations, patient history, and epidemiological information. The expected result is Negative.  Fact Sheet for Patients:  EntrepreneurPulse.com.au  Fact Sheet for Healthcare Providers:  IncredibleEmployment.be  This test is no t yet approved or cleared by the [place] FDA and  has been authorized for detection and/or diagnosis of SARS-CoV-2 by FDA under an Emergency Use Authorization (EUA). This EUA will remain  in effect (meaning this test can be used) for the duration of the COVID-19 declaration under Section 564(b)(1) of the Act, 21 U.S.C.section 360bbb-3(b)(1), unless the authorization is terminated  or revoked sooner.       Influenza  A by PCR NEGATIVE NEGATIVE Final   Influenza B by PCR NEGATIVE NEGATIVE Final    Comment: (NOTE) The Xpert Xpress SARS-CoV-2/FLU/RSV plus assay is intended as an aid in the diagnosis of influenza from Nasopharyngeal swab specimens and should not be used as a sole basis for treatment. Nasal washings and aspirates are unacceptable for Xpert Xpress SARS-CoV-2/FLU/RSV testing.  Fact Sheet for Patients: EntrepreneurPulse.com.au  Fact Sheet for Healthcare Providers: IncredibleEmployment.be  This test is not yet approved or cleared by the [place] FDA and has been authorized for detection and/or diagnosis of SARS-CoV-2 by FDA under an Emergency Use Authorization (EUA). This EUA will remain in effect (meaning this test can be used) for the duration of the COVID-19 declaration under Section 564(b)(1) of the Act, 21 U.S.C. section 360bbb-3(b)(1), unless the authorization is terminated  or revoked.  Performed at [facility], [address] [place], [place] [address]   MRSA Next Gen by PCR, Nasal     Status: None   Collection Time: 05/25/21 10:19 PM   Specimen: Nasal Mucosa; Nasal Swab  Result Value Ref Range Status   MRSA by PCR Next Gen NOT DETECTED NOT DETECTED Final    Comment: (NOTE) The GeneXpert MRSA Assay (FDA approved for NASAL specimens only), is one component of a comprehensive MRSA colonization surveillance program. It is not intended to diagnose MRSA infection nor to guide or monitor treatment for MRSA infections. Test performance is not FDA approved in patients less than 8 years old. Performed at [facility], [place] [address], [place], [place] [address]       Radiology Studies: No results found.  Marzetta Board, MD, PhD [facility]  Between 7 am - 7 pm I am available, please contact me via Amion (for emergencies) or Securechat (non urgent messages)  Between 7 pm - 7 am I am not available, please contact night coverage MD/APP via Amion

## 2021-05-28 NOTE — Evaluation (Addendum)
Speech Language Pathology Evaluation Patient Details Name: Monica Miranda MRN: [id number] DOB: [birth year] Today's Date: 05/28/2021 Time: 6433-2951 SLP Time Calculation (min) (ACUTE ONLY): 16 min  Problem List:  Patient Active Problem List   Diagnosis Date Noted   Pressure injury of skin 05/26/2021   ICH (intracerebral hemorrhage) (HCC) 05/25/2021   Past Medical History:  Past Medical History:  Diagnosis Date   COPD (chronic obstructive pulmonary disease) (HCC)    Rib fracture    4 months ago   Stroke [facility])    Past Surgical History:  Past Surgical History:  Procedure Laterality Date   ESOPHAGEAL DILATION     HPI:  Pt is a 60 y/o female who presented to [facility] ED on 12/20 with R IPH with L sided weakness and was transferred to [facility]. MRI showed large IPH centered at frontotemporal region, estimated volume 54 mL with medial extension in to R basal ganglia, thalamus, R cerebral peduncle and midbrain. Pt was admitted in [place] from 12/2-12/01/2021 with large R thalamic/basal ganglia/frontotemporal IPH and UDS positive for cocaine and amphetamines at that time. PMH: COPD, rib fracture, polysubstance abuse.   Assessment / Plan / Recommendation Clinical Impression  Pt participated in speech/language/cognition evaluation. Pt reported that she was living independently and employed full time making custom furniture. She denied any baseline deficits in speech, language, or cognition, but stated that she has been feeling "confused" since the stroke. The [facility] Mental Status Examination was completed to evaluate the pt's cognitive-linguistic skills. She achieved a score of 14/30 which is below the normal limits of 27 or more out of 30. She exhibited difficulty in the areas of attention, memory, complex problem solving, temporal orientation, and executive function. Skilled SLP services are clinically indicated at this time to improve cognitive-linguistic function.    SLP Assessment   SLP Recommendation/Assessment: Patient needs continued Speech Lanaguage Pathology Services SLP Visit Diagnosis: Cognitive communication deficit (R41.841)    Recommendations for follow up therapy are one component of a multi-disciplinary discharge planning process, led by the attending physician.  Recommendations may be updated based on patient status, additional functional criteria and insurance authorization.    Follow Up Recommendations  Acute inpatient rehab (3hours/day)    Assistance Recommended at Discharge  Frequent or constant Supervision/Assistance  Functional Status Assessment Patient has had a recent decline in their functional status and demonstrates the ability to make significant improvements in function in a reasonable and predictable amount of time.  Frequency and Duration min 2x/week  2 weeks      SLP Evaluation Cognition  Overall Cognitive Status: Impaired/Different from baseline Arousal/Alertness: Awake/alert Orientation Level: Oriented X4 Year:  (2012) Month: December Day of Week: Incorrect Attention: Focused;Sustained Focused Attention: Appears intact Sustained Attention: Impaired Sustained Attention Impairment: Verbal complex Memory: Impaired Memory Impairment: Decreased recall of new information;Retrieval deficit (Immediate: 5/5; delayed: 4/5) Awareness: Appears intact Problem Solving: Impaired Problem Solving Impairment: Verbal complex Executive Function: Sequencing;Organizing Sequencing: Impaired Sequencing Impairment: Verbal complex (clock drawing: 0/4) Organizing: Impaired Organizing Impairment: Verbal complex (backward digit span: 1/2)       Comprehension  Auditory Comprehension Overall Auditory Comprehension: Appears within functional limits for tasks assessed Yes/No Questions: Within Functional Limits Commands: Within Functional Limits Conversation: Complex    Expression Verbal Expression Initiation: No impairment Level of  Generative/Spontaneous Verbalization: Conversation Naming: No impairment Interfering Components: Attention Written Expression Dominant Hand: Right   Oral / Motor  Oral Motor/Sensory Function Overall Oral Motor/Sensory Function: Within functional limits Motor Speech Overall Motor Speech:  Appears within functional limits for tasks assessed Respiration: Within functional limits Phonation: Normal Resonance: Within functional limits Articulation: Within functional limitis Intelligibility: Intelligible Motor Planning: Witnin functional limits Motor Speech Errors: Not applicable           Ishitha Roper I. Vear Clock, MS, CCC-SLP Acute Rehabilitation Services Office number [phone] Pager [phone]  Scheryl Marten 05/28/2021, 10:23 AM

## 2021-05-28 NOTE — H&P (Signed)
Physical Medicine and Rehabilitation Admission H&P     HPI: Monica Miranda is a 60 year old right-handed female with history of hypertension, hyperlipidemia, COPD/tobacco use as well as polysubstance abuse.  Per chart review patient had been living  alone in [place] for the last 2 years and reportedly independent.  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  [place] was stable on multiple repeat CTs.  Her drug screen was positive for cocaine and amphetamines as well as a long history of alcohol use.  She was started on hypertonic saline monitor closely in the ICU required nicardipine drip.  She was discharged on salt tablets 2 g every 8 hours with sodium greater than 140 as well as started on Keppra 500 twice daily for seizure prophylaxis.  She was discharged planning to live with her son and daughter in law in [place] and on the drive from [place] noted increased left-sided weakness as well as altered mental status while driving through [place].  She did not seek attention for that and made it home and  presented to [facility] 05/26/2021.  A CT of the head as well as cervical spine showed large intraparenchymal hemorrhage involving posterior right frontal lobe moderate amount of surrounding edema and associated mass-effect on the right lateral ventricle and proximally 11 mm right to left midline shift.  No acute traumatic cervical spine pathology.  MRA/MRI large intraparenchymal hemorrhages right frontotemporal region estimated volume 54 mL.  Medial extension to involve right basal ganglia thalamus right cerebral peduncle and midbrain.  No visible underlying lesion or structural abnormality.  Associated regional mass-effect 5 mm right to left shift as well as associated small volume subarachnoid hemorrhage within the adjacent posterior right  parietal temporal region.  Echocardiogram with ejection fraction of 60 to 65% no wall motion abnormality.  Admission chemistries were unremarkable except alkaline phosphatase 138, troponin negative, urine drug screen negative.  She remains on Keppra for seizure prophylaxis.  Tolerating a regular diet.  Therapy evaluations completed due to patient's left-sided weakness decreased functional mobility was admitted for a comprehensive rehab program.  Review of Systems  Constitutional:  Negative for chills and fever.  HENT:  Negative for hearing loss.   Eyes:  Negative for blurred vision and double vision.  Respiratory:  Negative for cough and shortness of breath.   Cardiovascular:  Negative for chest pain and palpitations.  Gastrointestinal:  Positive for constipation. Negative for heartburn, nausea and vomiting.  Genitourinary:  Negative for dysuria, flank pain and hematuria.  Musculoskeletal:  Positive for back pain and myalgias.  Skin:  Negative for rash.  Neurological:  Positive for dizziness, weakness and headaches.  All other systems reviewed and are negative. Past Medical History:  Diagnosis Date   COPD (chronic obstructive pulmonary disease) ([place])    Rib fracture    4 months ago   Stroke [facility])    Past Surgical History:  Procedure Laterality Date   ESOPHAGEAL DILATION     No family history on file. Social History:  reports that she has been smoking cigarettes. She has been smoking an average of .5 packs per day. She has never used smokeless tobacco. She reports current alcohol use of about 1.0 standard drink per week. She reports current drug use. Drugs: Marijuana and Cocaine. Allergies:  Allergies  Allergen Reactions   Codeine Rash   Medications Prior to Admission  Medication  Sig Dispense Refill   acetaminophen (TYLENOL) 500 MG tablet Take 500 mg by mouth every 6 (six) hours as needed.     albuterol (PROVENTIL HFA;VENTOLIN HFA) 108 (90 BASE) MCG/ACT inhaler Inhale 1-2 puffs into  the lungs every 6 (six) hours as needed for wheezing. (Patient not taking: Reported on 05/25/2021) 1 Inhaler 0   albuterol (PROVENTIL HFA;VENTOLIN HFA) 108 (90 BASE) MCG/ACT inhaler Inhale 1-2 puffs into the lungs every 6 (six) hours as needed for wheezing or shortness of breath. (Patient not taking: Reported on 05/25/2021) 1 Inhaler 0   amoxicillin (AMOXIL) 500 MG capsule Take 1 capsule (500 mg total) by mouth 3 (three) times daily. (Patient not taking: Reported on 05/25/2021) 30 capsule 0   cyanocobalamin 500 MCG tablet Take 500 mcg by mouth daily. (Patient not taking: Reported on 05/25/2021)     cyclobenzaprine (FLEXERIL) 10 MG tablet Take 5 mg by mouth 3 (three) times daily as needed for muscle spasms.     doxycycline (VIBRAMYCIN) 100 MG capsule Take 1 capsule (100 mg total) by mouth 2 (two) times daily. One po bid x 7 days (Patient not taking: Reported on 05/25/2021) 14 capsule 0   folic acid (FOLVITE) 1 MG tablet Take 1 mg by mouth daily.     levETIRAcetam (KEPPRA) 100 MG/ML solution Take 500 mg by mouth every 12 (twelve) hours.     lisinopril (ZESTRIL) 20 MG tablet Take 20 mg by mouth daily.     Melatonin 10 MG CAPS Take 1 capsule by mouth at bedtime.     Multiple Vitamins-Minerals (MULTIVITAMINS THER. W/MINERALS) TABS Take 1 tablet by mouth daily.   (Patient not taking: Reported on 05/25/2021)     ondansetron (ZOFRAN) 4 MG tablet Take 1 tablet (4 mg total) by mouth every 8 (eight) hours as needed for nausea or vomiting. (Patient not taking: Reported on 05/25/2021) 10 tablet 0   oxyCODONE-acetaminophen (PERCOCET) 5-325 MG tablet Take 1-2 tablets by mouth every 4 (four) hours as needed. (Patient not taking: Reported on 05/25/2021) 20 tablet 0   sodium chloride 1 g tablet Take 1 g by mouth every 12 (twelve) hours.     traMADol (ULTRAM) 50 MG tablet Take 1 tablet (50 mg total) by mouth every 6 (six) hours as needed. (Patient not taking: Reported on 05/25/2021) 15 tablet 0    Drug Regimen  Review Drug regimen was reviewed and remains appropriate with no significant issues identified  Home: Home Living Family/patient expects to be discharged to:: Private residence Living Arrangements: Children Available Help at Discharge: Family, Available 24 hours/day Type of Home: House Home Access: Stairs to enter [employer] of Steps: 2 Entrance Stairs-Rails: Right, Left Home Layout: One level Bathroom Shower/Tub: Engineer, manufacturing systems: Standard Home Equipment: None   Functional History: Prior Function Prior Level of Function : Independent/Modified Independent  Functional Status:  Mobility: Bed Mobility Overal bed mobility: Needs Assistance Bed Mobility: Supine to Sit Supine to sit: Mod assist General bed mobility comments: ModA bringing L LE off bed and trunk elevation Transfers Overall transfer level: Needs assistance Equipment used: 2 person hand held assist Transfers: Sit to/from Stand, Bed to chair/wheelchair/BSC Sit to Stand: Mod assist, +2 physical assistance Bed to/from chair/wheelchair/BSC transfer type:: Step pivot Step pivot transfers: Mod assist, +2 physical assistance General transfer comment: modA+2 for sit to stand from EOB. Blocking L knee and cues required for sequencing. Patient able to take steps towards chair but modA+2 to maintain standing balance and cues for sequencing  ADL: ADL Overall ADL's : Needs assistance/impaired Eating/Feeding: Minimal assistance, Cueing for compensatory techinques Grooming: Wash/dry hands, Wash/dry face, Minimal assistance, Cueing for compensatory techniques, Sitting Upper Body Bathing: Moderate assistance, Bed level Lower Body Bathing: Total assistance, Bed level Upper Body Dressing : Moderate assistance, Sitting Lower Body Dressing: Total assistance, Bed level Toilet Transfer: Moderate assistance, +2 for physical assistance  Cognition: Cognition Overall Cognitive Status: Impaired/Different  from baseline Orientation Level: Oriented X4 Cognition Arousal/Alertness: Awake/alert Behavior During Therapy: WFL for tasks assessed/performed Overall Cognitive Status: Impaired/Different from baseline Area of Impairment: Orientation, Attention, Memory, Following commands, Safety/judgement, Awareness, Problem solving Orientation Level: Disoriented to, Place, Time Current Attention Level: Sustained Memory: Decreased recall of precautions, Decreased short-term memory Following Commands: Follows one step commands with increased time Safety/Judgement: Decreased awareness of safety Awareness: Emergent Problem Solving: Slow processing, Difficulty sequencing, Requires verbal cues General Comments: states that she is in [place] and it's January 2012. Following commands with increased time but easily distracted by being cold. cues required for redirection.  Physical Exam: Blood pressure 136/80, pulse 75, temperature 98 F (36.7 C), temperature source Oral, resp. rate (!) 29, SpO2 95 %. Physical Exam Constitutional:      General: She is not in acute distress.    Appearance: She is ill-appearing.     Comments: Looks older than age  HENT:     Head: Normocephalic.     Mouth/Throat:     Mouth: Mucous membranes are moist.     Comments: Missing teeth Eyes:     Extraocular Movements: Extraocular movements intact.     Pupils: Pupils are equal, round, and reactive to light.  Cardiovascular:     Rate and Rhythm: Normal rate and regular rhythm.     Heart sounds: No murmur heard.   No gallop.  Pulmonary:     Effort: Pulmonary effort is normal. No respiratory distress.     Breath sounds: No wheezing.  Abdominal:     General: Bowel sounds are normal. There is distension.     Palpations: Abdomen is soft.     Tenderness: There is no abdominal tenderness.  Musculoskeletal:        General: No swelling or tenderness. Normal range of motion.     Cervical back: Normal range of motion and neck supple.   Skin:    General: Skin is warm and dry.  Neurological:     Comments: Patient is alert makes eye contact with examiner.  Left central 7. Speech sl dysarthric. Distracted, decreased concentration. Poor insight and awareness. Moves right side freely 4/5. LUE 1 to 1+/5 prox to distal. LLE 1+ to 2- HE, KE to 0/5 distally. Decreased LT/pain left arm and leg. DTR's 3+ LLE, LUE. Toes up. Early extensor tone LLE, flexor tone LUE. Left inattention  Psychiatric:     Comments: Anxious and distracted    Results for orders placed or performed during the hospital encounter of 05/25/21 (from the past 48 hour(s))  Sodium     Status: None   Collection Time: 05/27/21  1:09 AM  Result Value Ref Range   Sodium 136 135 - 145 mmol/L    Comment: Performed at [facility], [address], [place], [place] [address]  Comprehensive metabolic panel     Status: Abnormal   Collection Time: 05/28/21  3:19 AM  Result Value Ref Range   Sodium 138 135 - 145 mmol/L   Potassium 3.5 3.5 - 5.1 mmol/L   Chloride 105 98 - 111 mmol/L  CO2 27 22 - 32 mmol/L   Glucose, Bld 97 70 - 99 mg/dL    Comment: Glucose reference range applies only to samples taken after fasting for at least 8 hours.   BUN 10 8 - 23 mg/dL   Creatinine, Ser 0.47 0.44 - 1.00 mg/dL   Calcium 8.7 (L) 8.9 - 10.3 mg/dL   Total Protein 5.0 (L) 6.5 - 8.1 g/dL   Albumin 2.8 (L) 3.5 - 5.0 g/dL   AST 37 15 - 41 U/L   ALT 35 0 - 44 U/L   Alkaline Phosphatase 118 38 - 126 U/L   Total Bilirubin 0.4 0.3 - 1.2 mg/dL   GFR, Estimated >60 >60 mL/min    Comment: (NOTE) Calculated using the CKD-EPI Creatinine Equation (2021)    Anion gap 6 5 - 15    Comment: Performed at [facility], [place] [address] [place], [place] [address]  CBC     Status: None   Collection Time: 05/28/21  3:19 AM  Result Value Ref Range   WBC 5.1 4.0 - 10.5 K/uL   RBC 4.48 3.87 - 5.11 MIL/uL   Hemoglobin 12.6 12.0 - 15.0 g/dL   HCT 39.4 36.0 - 46.0 %   MCV 87.9 80.0 - 100.0  fL   MCH 28.1 26.0 - 34.0 pg   MCHC 32.0 30.0 - 36.0 g/dL   RDW 15.2 11.5 - 15.5 %   Platelets 249 150 - 400 K/uL   nRBC 0.0 0.0 - 0.2 %    Comment: Performed at [facility], [place] [address], [place],  [address]   MR BRAIN W WO CONTRAST  Result Date: 05/26/2021 CLINICAL DATA:  Acute neuro deficit, acute, stroke suspected. Follow-up intracranial hemorrhage. EXAM: MRI HEAD WITHOUT AND WITH CONTRAST TECHNIQUE: Multiplanar, multiecho pulse sequences of the brain and surrounding structures were obtained without and with intravenous contrast. CONTRAST:  4mL GADAVIST GADOBUTROL 1 MMOL/ML IV SOLN COMPARISON:  MRI earlier same day.  Head CT yesterday. FINDINGS: Brain: Redemonstration of an intraparenchymal hematoma on the right measuring 6.3 x 5.1 x 3.4 cm (volume = 57 cm^3), not visibly changed since the immediate prior exam. Small region of nonhemorrhagic acute infarction noted by diffusion imaging at the posterior frontal lobe superior to the hematoma. The hematoma is primarily localized in the right temporal and parietal region with medial extension into the right thalamus. Surrounding vasogenic edema appears similar. Mass-effect is similar with right-to-left shift of 5 mm. Gyriform enhancement occurs within the right posterior frontal and parietal region consistent with subacute ischemic insult. Elsewhere, there are chronic small-vessel ischemic changes of the pons and cerebral hemispheric white matter. No evidence of hydrocephalus or extra-axial collection. Left lateral ventricle is larger than the right, probably because of mass effect upon the right ventricle. Ventricular trapping is less likely, particularly given this amount mass effect. Vascular: Major vessels at the base of the brain show flow. Skull and upper cervical spine: Negative Sinuses/Orbits: Clear/normal Other: None IMPRESSION: No change or progression since the prior study. Right middle cerebral artery territory infarction with  intraparenchymal hematoma, estimated volume 57 cc. Degree of regional edema and swelling is similar with mass effect resulting in 5 mm of right-to-left shift. Ventricular size is stable. Postcontrast imaging shows gyriform enhancement in the frontoparietal region consistent with subacute infarction. Electronically Signed   By: Nelson Chimes M.D.   On: 05/26/2021 12:31   ECHOCARDIOGRAM COMPLETE  Result Date: 05/27/2021    ECHOCARDIOGRAM REPORT   Patient  Name:   Erskine SquibbHARITY A Mcwright Date of Exam: 05/27/2021 Medical Rec #:  [id number]       Height:       61.0 in Accession #:    [id number]      Weight:       94.8 lb Date of Birth:  1961/01/21       BSA:          1.376 m Patient Age:    60 years        BP:           121/90 mmHg Patient Gender: F               HR:           84 bpm. Exam Location:  Inpatient Procedure: Cardiac Doppler, Color Doppler and 2D Echo Indications:    hemorrhagic stroke  History:        Patient has no prior history of Echocardiogram examinations.                 Malnutrition.  Sonographer:    Roosvelt Maserachel Lane RDCS Referring Phys: [id number] SHARON L BIBY IMPRESSIONS  1. Left ventricular ejection fraction, by estimation, is 60 to 65%. The left ventricle has normal function. The left ventricle has no regional wall motion abnormalities. Left ventricular diastolic function could not be evaluated.  2. Right ventricular systolic function is normal. The right ventricular size is normal. Tricuspid regurgitation signal is inadequate for assessing PA pressure.  3. Left atrial size was moderately dilated.  4. There is systolic anterior motion of the mitral valve with eccentric moderate to severe mitral regurgitation as a result. 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 No evidence of mitral stenosis.  5. The aortic valve is tricuspid. Aortic valve regurgitation is not visualized. No aortic stenosis is present.  6. The inferior vena cava is normal in size with greater than 50%  respiratory variability, suggesting right atrial pressure of 3 mmHg. Comparison(s): No prior Echocardiogram. 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 There is systolic anterior motion of the mitral valve with a late systolic MR jet. Difficult to fully appreciate on current study, but based on splay  and color in left atrium, suspect moderate-severe. Would consider limited repeat echo in the future after medical therapy to [facility]. FINDINGS  Left Ventricle: Left ventricular ejection fraction, by estimation, is 60 to 65%. The left ventricle has normal function. The left ventricle has no regional wall motion abnormalities. The left ventricular internal cavity size was normal in size. There is  no left ventricular hypertrophy. Left ventricular diastolic function could not be evaluated. Right Ventricle: The right ventricular size is normal. No increase in right ventricular wall thickness. Right ventricular systolic function is normal. Tricuspid regurgitation signal is inadequate for assessing PA pressure. Left Atrium: Left atrial size was moderately dilated. Right Atrium: Right atrial size was normal in size. Pericardium: There is no evidence of pericardial effusion. Mitral Valve: There is systolic anterior motion of the mitral valve with eccentric moderate to severe mitral regurgitation as a result. 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 No evidence of mitral valve stenosis. Tricuspid Valve: The tricuspid valve is normal in structure. Tricuspid valve regurgitation is trivial. No evidence of tricuspid stenosis. Aortic Valve: The aortic valve is tricuspid. Aortic valve regurgitation is not visualized. No aortic stenosis is present. Pulmonic Valve: The pulmonic valve was not  well visualized. Pulmonic valve regurgitation is not visualized. Aorta: The aortic root, ascending aorta, aortic arch and  descending aorta are all structurally normal, with no evidence of dilitation or obstruction. Venous: The inferior vena cava is normal in size with greater than 50% respiratory variability, suggesting right atrial pressure of 3 mmHg. IAS/Shunts: The atrial septum is grossly normal.  LEFT VENTRICLE PLAX 2D LVIDd:         4.10 cm LVIDs:         2.70 cm LV PW:         1.00 cm LV IVS:        0.80 cm LVOT diam:     1.90 cm LVOT Area:     2.84 cm  RIGHT VENTRICLE RV Basal diam:  2.60 cm LEFT ATRIUM             Index        RIGHT ATRIUM          Index LA diam:        3.40 cm 2.47 cm/m   RA Area:     8.37 cm LA Vol (A2C):   61.5 ml 44.71 ml/m  RA Volume:   15.40 ml 11.20 ml/m LA Vol (A4C):   61.0 ml 44.34 ml/m LA Biplane Vol: 62.0 ml 45.07 ml/m   AORTA Ao Root diam: 3.20 cm MITRAL VALVE MV Area (PHT): 3.99 cm     SHUNTS MV Decel Time: 190 msec     Systemic Diam: 1.90 cm MR Peak grad: 192.1 mmHg MR Vmax:      693.00 cm/s MV E velocity: 79.10 cm/s MV A velocity: 130.00 cm/s MV E/A ratio:  0.61 Buford Dresser MD Electronically signed by Buford Dresser MD Signature Date/Time: 05/27/2021/6:51:05 PM    Final    VAS US CAROTID  Result Date: 05/27/2021 Carotid Arterial Duplex Study Patient Name:  Cathren Laine  Date of Exam:   05/26/2021 Medical Rec #: [id number]        Accession #:    [id number] Date of Birth: [birth year]        Patient Gender: F Patient Age:   62 years Exam Location:  [facility] Procedure:      VAS US CAROTID Referring Phys: Alferd Patee [facility] --------------------------------------------------------------------------------  Indications:  CVA. Risk Factors: None. Limitations   Today's exam was limited due to the patient's inability or               unwillingness to cooperate. Performing Technologist: Maudry Mayhew MHA, RDMS, RVT, RDCS  Examination Guidelines: A complete evaluation includes B-mode imaging, spectral Doppler, color Doppler, and power Doppler as needed of all  accessible portions of each vessel. Bilateral testing is considered an integral part of a complete examination. Limited examinations for reoccurring indications may be performed as noted.  Right Carotid Findings: +----------+--------+--------+--------+---------------------+------------------+             PSV cm/s EDV cm/s Stenosis Plaque Description    Comments            +----------+--------+--------+--------+---------------------+------------------+  CCA Prox   82       21                                                          +----------+--------+--------+--------+---------------------+------------------+  CCA Distal 52  16                smooth and                                                                       heterogenous                              +----------+--------+--------+--------+---------------------+------------------+  ICA Prox   51       21                heterogenous, smooth                                                             and calcific                              +----------+--------+--------+--------+---------------------+------------------+  ICA Distal 61       26                                                          +----------+--------+--------+--------+---------------------+------------------+  ECA        67       15                                      intimal thickening  +----------+--------+--------+--------+---------------------+------------------+ +----------+--------+-------+----------------+-------------------+             PSV cm/s EDV cms Describe         Arm Pressure (mmHG)  +----------+--------+-------+----------------+-------------------+  Subclavian 113      91      Multiphasic, WNL                      +----------+--------+-------+----------------+-------------------+ +---------+--------+--+--------+--+---------+  Vertebral PSV cm/s 39 EDV cm/s 11 Antegrade  +---------+--------+--+--------+--+---------+  Left Carotid Findings:  +----------+--------+--------+--------+--------------------------+--------+             PSV cm/s EDV cm/s Stenosis Plaque Description         Comments  +----------+--------+--------+--------+--------------------------+--------+  CCA Prox   66       24                                                     +----------+--------+--------+--------+--------------------------+--------+  CCA Distal 48       15                irregular and heterogenous           +----------+--------+--------+--------+--------------------------+--------+  ICA Prox   45  20                smooth and heterogenous              +----------+--------+--------+--------+--------------------------+--------+  ICA Distal 56       24                                                     +----------+--------+--------+--------+--------------------------+--------+  ECA        67       20                                                     +----------+--------+--------+--------+--------------------------+--------+ +----------+--------+--------+----------------+-------------------+             PSV cm/s EDV cm/s Describe         Arm Pressure (mmHG)  +----------+--------+--------+----------------+-------------------+  Subclavian 179               Multiphasic, WNL                      +----------+--------+--------+----------------+-------------------+ +---------+--------+--+--------+--+---------+  Vertebral PSV cm/s 37 EDV cm/s 14 Antegrade  +---------+--------+--+--------+--+---------+   Summary: Right Carotid: Velocities in the right ICA are consistent with a 1-39% stenosis. Left Carotid: Velocities in the left ICA are consistent with a 1-39% stenosis. Vertebrals:  Bilateral vertebral arteries demonstrate antegrade flow. Subclavians: Normal flow hemodynamics were seen in bilateral subclavian              arteries. *See table(s) above for measurements and observations.  Electronically signed by Delia Heady MD on 05/27/2021 at 12:37:56 PM.    Final         Medical Problem List and Plan: 1.  Left-sided weakness functional deficits secondary to right thalamic/basal ganglia/frontal temporal ICH in the setting of hypertension as well as cocaine use  -patient may shower  -ELOS/Goals: 18-24 days, min assist with PT, OT, sup/min SLP  -left WFO, PRAFO's to be ordered 2.  Antithrombotics: -DVT/anticoagulation:  Mechanical: Antiembolism stockings, thigh (TED hose) Bilateral lower extremities  -antiplatelet therapy: N/A 3. Pain Management: Tylenol as needed 4. Mood: Provide emotional support  -antipsychotic agents: N/A 5. Neuropsych: This patient is capable of making decisions on her own behalf. 6. Skin/Wound Care: Routine skin checks 7. Fluids/Electrolytes/Nutrition: Routine in and outs with follow-up chemistries 8.  Hypertension.  Lisinopril 2.5 mg daily.  Monitor with increased mobility 9.  Seizure prophylaxis.  Keppra 500 mg twice daily 10.  Hyperlipidemia.  Statin held given ICH. 11.  History of polysubstance abuse.  Provide counseling 12.  History of COPD/tobacco use.  Monitor oxygen saturations every shift. 13. Constipation:  -sorbitol on admit, SSE if needed     Charlton Amor, PA-C 05/28/2021

## 2021-05-28 NOTE — Progress Notes (Signed)
Occupational Therapy Treatment Patient Details Name: Monica Miranda MRN: [id number] DOB: 10/01/1960 Today's Date: 05/28/2021   History of present illness 60 y/o female presented to [facility] ED on 12/20 with R IPH with L sided weakness. Was admitted in [place] from 12/2-12/01/2021 with large R thalamic/basal ganglia/frontotemporal IPH and drug screen positive for cocaine and amphetamines. Transferred to [facility] MRI showed large IPH centered at frontotemporal region, estimated volume 54 mL with medial extension in to R basal ganglia, thalamus, R cerebral peduncle and midbrain. PMH: COPD, rib fracture, polysubstance abuse.   OT comments  Patient found sliding down in recliner with alarm going off.  OT and RN assisted patient to the [facility].  Patient able to have a BM.  Needing Max A for controlled lateral step from recliner to [facility], then BSC to bed.  Patient able to activate LLE much better than am session, and step times three from [facility] to bed.  OT to continue in the acute setting.  Second session this date logged for participation, progress and time involved.      Recommendations for follow up therapy are one component of a multi-disciplinary discharge planning process, led by the attending physician.  Recommendations may be updated based on patient status, additional functional criteria and insurance authorization.    Follow Up Recommendations  Acute inpatient rehab (3hours/day)    Assistance Recommended at Discharge Frequent or constant Supervision/Assistance  Equipment Recommendations  BSC/3in1;Wheelchair (measurements OT);Wheelchair cushion (measurements OT)    Recommendations for Other Services      Precautions / Restrictions Precautions Precautions: Fall Precaution Comments: BP <150 Restrictions Weight Bearing Restrictions: No       Mobility Bed Mobility Overal bed mobility: Needs Assistance Bed Mobility: Sit to Supine     Supine to sit: Min assist Sit to supine: Mod assist;+2 for  physical assistance   General bed mobility comments: Min A bringing L LE off bed and trunk elevation    Transfers Overall transfer level: Needs assistance Equipment used: 2 person hand held assist Transfers: Sit to/from Stand;Bed to chair/wheelchair/BSC Sit to Stand: Mod assist   Step pivot transfers: Max assist       General transfer comment: modA+2 to perform step pivot transfer. Patient requiring assist for advancing L LE towards recliner due to sensation and strength deficits. Cues for sequencing and benefits from step by step instructions prior to mobilizing     Balance Overall balance assessment: Needs assistance Sitting-balance support: Single extremity supported;Feet supported Sitting balance-Leahy Scale: Fair     Standing balance support: Bilateral upper extremity supported Standing balance-Leahy Scale: Poor Standing balance comment: reliant on UE support and external assist                           ADL either performed or assessed with clinical judgement   ADL   Eating/Feeding: Minimal assistance;Cueing for compensatory techinques;Sitting   Grooming: Wash/dry hands;Wash/dry face;Minimal assistance;Cueing for compensatory techniques;Sitting           Upper Body Dressing : Moderate assistance;Sitting   Lower Body Dressing: Total assistance;Bed level   Toilet Transfer: Maximal assistance;BSC/3in1   Toileting- Clothing Manipulation and Hygiene: Total assistance;Sit to/from stand              Extremity/Trunk Assessment Upper Extremity Assessment LUE Deficits / Details: improved AROM to shoulder and elbow - trace noted. LUE Sensation: decreased light touch;decreased proprioception LUE Coordination: decreased fine motor;decreased gross motor   Lower Extremity Assessment Lower Extremity Assessment:  Defer to PT evaluation   Cervical / Trunk Assessment Cervical / Trunk Assessment: Normal    Vision   Additional Comments: continues with L  field cut and decreased attention/neglect to L visual field.  Improved tracking and smoother pursuits noted.  Barely crossing midline with gaze, but improved.   Perception Perception Perception: Impaired Inattention/Neglect: Does not attend to left visual field;Does not attend to left side of body   Praxis      Cognition Arousal/Alertness: Awake/alert Behavior During Therapy: WFL for tasks assessed/performed Overall Cognitive Status: Impaired/Different from baseline Area of Impairment: Orientation;Attention;Memory;Following commands;Safety/judgement;Awareness;Problem solving                 Orientation Level: Disoriented to;Time Current Attention Level: Sustained Memory: Decreased recall of precautions;Decreased short-term memory Following Commands: Follows one step commands with increased time Safety/Judgement: Decreased awareness of safety Awareness: Emergent Problem Solving: Slow processing;Difficulty sequencing;Requires verbal cues General Comments: consistent with "January 2012". Following commands with increased time. Emerging awareness about situation but fluctuating throughout session Functional Status Assessment: Patient has had a recent decline in their functional status and demonstrates the ability to make significant improvements in function in a reasonable and predictable amount of time.                          Pertinent Vitals/ Pain       Pain Assessment: Faces Faces Pain Scale: Hurts little more Pain Location: neck and back Pain Descriptors / Indicators: Discomfort;Grimacing Pain Intervention(s): Monitored during session  Home Living     Available Help at Discharge: Family;Available 24 hours/day Type of Home: House                              Lives With: Alone    Prior Functioning/Environment              Frequency  Min 2X/week        Progress Toward Goals  OT Goals(current goals can now be found in the care plan  section)  Progress towards OT goals: Progressing toward goals  Acute Rehab OT Goals Patient Stated Goal: I want to go to rehab OT Goal Formulation: With patient Time For Goal Achievement: 06/09/21 Potential to Achieve Goals: Good  Plan Discharge plan remains appropriate    Co-evaluation    PT/OT/SLP Co-Evaluation/Treatment: Yes Reason for Co-Treatment: Necessary to address cognition/behavior during functional activity;For patient/therapist safety;To address functional/ADL transfers PT goals addressed during session: Mobility/safety with mobility;Balance OT goals addressed during session: ADL's and self-care      AM-PAC OT "6 Clicks" Daily Activity     Outcome Measure   Help from another person eating meals?: A Little Help from another person taking care of personal grooming?: A Little Help from another person toileting, which includes using toliet, bedpan, or urinal?: A Lot Help from another person bathing (including washing, rinsing, drying)?: A Lot Help from another person to put on and taking off regular upper body clothing?: A Lot Help from another person to put on and taking off regular lower body clothing?: Total 6 Click Score: 13    End of Session Equipment Utilized During Treatment: Gait belt  OT Visit Diagnosis: Unsteadiness on feet (R26.81);Other abnormalities of gait and mobility (R26.89);Muscle weakness (generalized) (M62.81);Other symptoms and signs involving cognitive function;Hemiplegia and hemiparesis;Low vision, both eyes (H54.2) Hemiplegia - Right/Left: Left   Activity Tolerance Patient tolerated treatment well   Patient Left in  chair;with call bell/phone within reach;with chair alarm set;with family/visitor present   Nurse Communication          Time: [phone] OT Time Calculation (min): 12 min  Charges: OT General Charges $OT Visit: 1 Visit OT Treatments $Self Care/Home Management : 8-22 mins  05/28/2021  RP, OTR/L  Acute Rehabilitation  Services  Office:  [phone]   Suzanna Obey 05/28/2021, 12:02 PM

## 2021-05-29 ENCOUNTER — Encounter (HOSPITAL_COMMUNITY): Payer: Self-pay | Admitting: Physical Medicine and Rehabilitation

## 2021-05-29 ENCOUNTER — Other Ambulatory Visit: Payer: Self-pay

## 2021-05-29 ENCOUNTER — Inpatient Hospital Stay (HOSPITAL_COMMUNITY)
Admission: RE | Admit: 2021-05-29 | Discharge: 2021-06-17 | DRG: 057 | Disposition: A | Discharge status: Home / Self Care | Payer: Medicaid Other | Source: Intra-hospital | Attending: Physical Medicine and Rehabilitation | Admitting: Physical Medicine and Rehabilitation

## 2021-05-29 DIAGNOSIS — I619 Nontraumatic intracerebral hemorrhage, unspecified: Secondary | ICD-10-CM | POA: Diagnosis present

## 2021-05-29 DIAGNOSIS — G441 Vascular headache, not elsewhere classified: Secondary | ICD-10-CM | POA: Diagnosis not present

## 2021-05-29 DIAGNOSIS — E785 Hyperlipidemia, unspecified: Secondary | ICD-10-CM | POA: Diagnosis present

## 2021-05-29 DIAGNOSIS — F1490 Cocaine use, unspecified, uncomplicated: Secondary | ICD-10-CM | POA: Diagnosis present

## 2021-05-29 DIAGNOSIS — G44009 Cluster headache syndrome, unspecified, not intractable: Secondary | ICD-10-CM | POA: Diagnosis not present

## 2021-05-29 DIAGNOSIS — F17210 Nicotine dependence, cigarettes, uncomplicated: Secondary | ICD-10-CM | POA: Diagnosis present

## 2021-05-29 DIAGNOSIS — J449 Chronic obstructive pulmonary disease, unspecified: Secondary | ICD-10-CM | POA: Diagnosis present

## 2021-05-29 DIAGNOSIS — Z7151 Drug abuse counseling and surveillance of drug abuser: Secondary | ICD-10-CM

## 2021-05-29 DIAGNOSIS — R32 Unspecified urinary incontinence: Secondary | ICD-10-CM | POA: Diagnosis not present

## 2021-05-29 DIAGNOSIS — F419 Anxiety disorder, unspecified: Secondary | ICD-10-CM | POA: Diagnosis present

## 2021-05-29 DIAGNOSIS — I69254 Hemiplegia and hemiparesis following other nontraumatic intracranial hemorrhage affecting left non-dominant side: Secondary | ICD-10-CM | POA: Diagnosis present

## 2021-05-29 DIAGNOSIS — R7401 Elevation of levels of liver transaminase levels: Secondary | ICD-10-CM | POA: Diagnosis present

## 2021-05-29 DIAGNOSIS — M7989 Other specified soft tissue disorders: Secondary | ICD-10-CM | POA: Diagnosis not present

## 2021-05-29 DIAGNOSIS — I610 Nontraumatic intracerebral hemorrhage in hemisphere, subcortical: Secondary | ICD-10-CM

## 2021-05-29 DIAGNOSIS — I10 Essential (primary) hypertension: Secondary | ICD-10-CM | POA: Diagnosis present

## 2021-05-29 DIAGNOSIS — R569 Unspecified convulsions: Secondary | ICD-10-CM | POA: Diagnosis not present

## 2021-05-29 DIAGNOSIS — Z79899 Other long term (current) drug therapy: Secondary | ICD-10-CM

## 2021-05-29 DIAGNOSIS — I69222 Dysarthria following other nontraumatic intracranial hemorrhage: Secondary | ICD-10-CM

## 2021-05-29 DIAGNOSIS — I959 Hypotension, unspecified: Secondary | ICD-10-CM | POA: Diagnosis not present

## 2021-05-29 DIAGNOSIS — Z885 Allergy status to narcotic agent status: Secondary | ICD-10-CM | POA: Diagnosis not present

## 2021-05-29 DIAGNOSIS — G629 Polyneuropathy, unspecified: Secondary | ICD-10-CM | POA: Diagnosis present

## 2021-05-29 DIAGNOSIS — R0981 Nasal congestion: Secondary | ICD-10-CM | POA: Diagnosis not present

## 2021-05-29 DIAGNOSIS — R52 Pain, unspecified: Secondary | ICD-10-CM

## 2021-05-29 DIAGNOSIS — K5901 Slow transit constipation: Secondary | ICD-10-CM | POA: Diagnosis not present

## 2021-05-29 DIAGNOSIS — Z79891 Long term (current) use of opiate analgesic: Secondary | ICD-10-CM | POA: Diagnosis not present

## 2021-05-29 DIAGNOSIS — R001 Bradycardia, unspecified: Secondary | ICD-10-CM | POA: Diagnosis not present

## 2021-05-29 DIAGNOSIS — K5900 Constipation, unspecified: Secondary | ICD-10-CM | POA: Diagnosis present

## 2021-05-29 DIAGNOSIS — E559 Vitamin D deficiency, unspecified: Secondary | ICD-10-CM | POA: Diagnosis present

## 2021-05-29 DIAGNOSIS — F1910 Other psychoactive substance abuse, uncomplicated: Secondary | ICD-10-CM

## 2021-05-29 MED ORDER — LISINOPRIL 5 MG PO TABS
2.5000 mg | ORAL_TABLET | Freq: Every day | ORAL | Status: DC
  Administered 2021-05-29 – 2021-05-31 (×3): 2.5 mg via ORAL
  Filled 2021-05-29 (×3): qty 1

## 2021-05-29 MED ORDER — ACETAMINOPHEN 325 MG PO TABS
650.0000 mg | ORAL_TABLET | ORAL | Status: DC | PRN
  Administered 2021-05-30 – 2021-06-14 (×13): 650 mg via ORAL
  Filled 2021-05-29 (×18): qty 2

## 2021-05-29 MED ORDER — LISINOPRIL 2.5 MG PO TABS: 2.5000 mg | ORAL_TABLET | Freq: Every day | ORAL | Status: DC

## 2021-05-29 MED ORDER — NICOTINE 21 MG/24HR TD PT24
21.0000 mg | MEDICATED_PATCH | Freq: Every day | TRANSDERMAL | Status: DC
  Administered 2021-05-29 – 2021-06-06 (×5): 21 mg via TRANSDERMAL
  Filled 2021-05-29 (×16): qty 1

## 2021-05-29 MED ORDER — MELATONIN 3 MG PO TABS: 3.0000 mg | ORAL_TABLET | Freq: Every day | ORAL | Status: DC

## 2021-05-29 MED ORDER — TRAMADOL HCL 50 MG PO TABS
50.0000 mg | ORAL_TABLET | Freq: Four times a day (QID) | ORAL | Status: DC | PRN
  Administered 2021-05-29 – 2021-06-10 (×15): 50 mg via ORAL
  Filled 2021-05-29 (×17): qty 1

## 2021-05-29 MED ORDER — ACETAMINOPHEN 160 MG/5ML PO SOLN: 650.0000 mg | ORAL | Status: DC | PRN

## 2021-05-29 MED ORDER — SORBITOL 70 % SOLN
60.0000 mL | Status: AC
  Administered 2021-05-29: 17:00:00 60 mL via ORAL
  Filled 2021-05-29: qty 60

## 2021-05-29 MED ORDER — PANTOPRAZOLE SODIUM 40 MG PO TBEC
40.0000 mg | DELAYED_RELEASE_TABLET | Freq: Every day | ORAL | Status: DC
  Administered 2021-05-29 – 2021-06-16 (×16): 40 mg via ORAL
  Filled 2021-05-29 (×16): qty 1

## 2021-05-29 MED ORDER — ACETAMINOPHEN 650 MG RE SUPP: 650.0000 mg | RECTAL | Status: DC | PRN

## 2021-05-29 MED ORDER — LEVETIRACETAM 500 MG PO TABS
500.0000 mg | ORAL_TABLET | Freq: Two times a day (BID) | ORAL | Status: DC
  Administered 2021-05-29 – 2021-06-06 (×16): 500 mg via ORAL
  Filled 2021-05-29 (×16): qty 1

## 2021-05-29 MED ORDER — MELATONIN 3 MG PO TABS
3.0000 mg | ORAL_TABLET | Freq: Every day | ORAL | Status: DC
  Administered 2021-05-29 – 2021-06-07 (×10): 3 mg via ORAL
  Filled 2021-05-29 (×10): qty 1

## 2021-05-29 MED ORDER — SENNOSIDES-DOCUSATE SODIUM 8.6-50 MG PO TABS
1.0000 | ORAL_TABLET | Freq: Two times a day (BID) | ORAL | Status: DC
  Administered 2021-05-29 – 2021-06-16 (×23): 1 via ORAL
  Filled 2021-05-29 (×32): qty 1

## 2021-05-29 MED ORDER — BLOOD PRESSURE CONTROL BOOK
Freq: Once | Status: AC
  Filled 2021-05-29: qty 1

## 2021-05-29 MED ORDER — TRAZODONE HCL 50 MG PO TABS
25.0000 mg | ORAL_TABLET | Freq: Every evening | ORAL | Status: DC | PRN
  Administered 2021-06-03 – 2021-06-16 (×5): 25 mg via ORAL
  Filled 2021-05-29 (×7): qty 1

## 2021-05-29 NOTE — Discharge Summary (Signed)
Physician Discharge Summary  [name]:[id number] DOB: 1961-05-26 DOA: 05/25/2021  PCP: Patient, No Pcp Per (Inactive)  Admit date: 05/25/2021 Discharge date: 05/29/2021  Admitted From: Home Disposition: CIR  Recommendations for Outpatient Follow-up:  Follow up with CIR provider at earliest convenience Outpatient follow-up with neurology Comply with medications and follow-up Abstain from illicit drug use Follow up in ED if symptoms worsen or new appear   Home Health: No Equipment/Devices: None  Discharge Condition: Stable CODE STATUS: Full Diet recommendation: Heart healthy  Brief/Interim Summary: 60 year old female with history of polysubstance abuse, cocaine, amphetamine, hypertension, hyperlipidemia, COPD, recent admission in [place] from 05/07/2021-05/13/2021 presented with worsening left-sided weakness and was found to have right intraparenchymal hemorrhage with large right thalamic/basal ganglia/frontotemporal intraparenchymal hemorrhage with left-sided weakness. Her UDS was positive for cocaine and amphetamines at that time, she also has a history of EtOH use.  She was started on hypertonic saline, monitored in the ICU and eventually improved.  Apparently she was recommended to go to rehab however refused.  Upon noticing worsening left-sided weakness she was brought back to the hospital.  Imaging showed  stable hemorrhage when compared to her [place] hospitalization.  Neurology was consulted and recommended strict blood pressure control with systolic goal below 366.  Neurology has signed off.  PT recommended CIR placement.  She will be discharged to CIR once bed is available.  Discharge Diagnoses:   Right recent right thalamic/basal ganglia/frontotemporal ICH -this is in the setting of hypertension, cocaine and amphetamine use.  Current imaging does not show any new hemorrhage but it does show the ones that she initially presented with now resolved.  CT scan and MRI shows  subacute large right parenchymal hemorrhage with mild cytotoxic edema and mild right to left midline shift of 5 mm which is a lot improved compared to her initial CT in [place] reportedly stated 5 cm hemorrhage with 10 mm right-to-left shift.  There is a right thalamic hypodensity, query met.  -Neurology was consulted and recommended strict blood pressure control with systolic goal below 440.  Neurology has signed off.  Outpatient follow-up with neurology -Carotid Dopplers without significant stenosis.  LDL was 91.  2D echo showed EF 60-65%. -PT recommended CIR placement.  She will be discharged to CIR once bed is available. -Continue Keppra  Essential hypertension -Continue low-dose of lisinopril and titrate upwards depending on blood pressure.  Apparently noncompliant with lisinopril at home.  Hyperlipidemia -LDL 91.  Statin on hold given ICH  Polysubstance abuse -Patient was counseled regarding cessation by prior hospitalist.  Apparently uses cocaine, amphetamine and alcohol  History of COPD -Stable.  No wheezing.  Outpatient follow-up.  Tobacco use -Patient was counseled regarding tobacco cessation by prior hospitalist  Discharge Instructions  Discharge Instructions     Ambulatory referral to Neurology   Complete by: As directed    An appointment is requested in approximately: 2 weeks   Diet - low sodium heart healthy   Complete by: As directed    Increase activity slowly   Complete by: As directed    No wound care   Complete by: As directed       Allergies as of 05/29/2021       Reactions   Codeine Rash        Medication List     STOP taking these medications    amoxicillin 500 MG capsule Commonly known as: AMOXIL   cyclobenzaprine 10 MG tablet Commonly known as: FLEXERIL   doxycycline 100  MG capsule Commonly known as: VIBRAMYCIN   Melatonin 10 MG Caps Replaced by: melatonin 3 MG Tabs tablet   multivitamins ther. w/minerals Tabs tablet    ondansetron 4 MG tablet Commonly known as: Zofran   oxyCODONE-acetaminophen 5-325 MG tablet Commonly known as: Percocet   sodium chloride 1 g tablet   vitamin B-12 500 MCG tablet Commonly known as: CYANOCOBALAMIN       TAKE these medications    acetaminophen 500 MG tablet Commonly known as: TYLENOL Take 500 mg by mouth every 6 (six) hours as needed.   albuterol 108 (90 Base) MCG/ACT inhaler Commonly known as: VENTOLIN HFA Inhale 1-2 puffs into the lungs every 6 (six) hours as needed for wheezing or shortness of breath. What changed: Another medication with the same name was removed. Continue taking this medication, and follow the directions you see here.   folic acid 1 MG tablet Commonly known as: FOLVITE Take 1 mg by mouth daily.   levETIRAcetam 100 MG/ML solution Commonly known as: KEPPRA Take 500 mg by mouth every 12 (twelve) hours.   lisinopril 2.5 MG tablet Commonly known as: ZESTRIL Take 1 tablet (2.5 mg total) by mouth daily. Start taking on: May 30, 2021 What changed:  medication strength how much to take   melatonin 3 MG Tabs tablet Take 1 tablet (3 mg total) by mouth at bedtime. Replaces: Melatonin 10 MG Caps   traMADol 50 MG tablet Commonly known as: ULTRAM Take 1 tablet (50 mg total) by mouth every 6 (six) hours as needed.        Allergies  Allergen Reactions   Codeine Rash    Consultations: neurology   Procedures/Studies: CT Head Wo Contrast  Result Date: 05/25/2021 CLINICAL DATA:  Altered mental status. EXAM: CT HEAD WITHOUT CONTRAST CT CERVICAL SPINE WITHOUT CONTRAST TECHNIQUE: Multidetector CT imaging of the head and cervical spine was performed following the standard protocol without intravenous contrast. Multiplanar CT image reconstructions of the cervical spine were also generated. COMPARISON:  None. FINDINGS: CT HEAD FINDINGS Brain: There is a large intraparenchymal hemorrhage involving the posterior right frontal lobe  extending into the temporal and measuring approximately 3.6 x 2.7 cm in greatest axial dimensions. There is moderate amount of surrounding edema with associated mass effect on the right lateral ventricle and proximally 11 mm right to left midline shift. There may be extension of a small amount of blood into the right thalamus. There is a 1.5 x 1.1 cm ill-defined hypodense area in the right thalamus which may represent edema or metastatic disease. Additionally there is some edema surrounding the occipital horn of the left lateral ventricle. Findings may represent metastatic disease. Further evaluation with MRI without and with contrast recommended. There is mild dilatation of the left lateral ventricle which may represent a degree of entrapment. Vascular: No hyperdense vessel or unexpected calcification. Skull: Normal. Negative for fracture or focal lesion. Sinuses/Orbits: No acute finding. Other: None CT CERVICAL SPINE FINDINGS Alignment: No acute subluxation. There is reversal of normal cervical lordosis which may be positional or due to muscle spasm. Skull base and vertebrae: No acute fracture. Soft tissues and spinal canal: No prevertebral fluid or swelling. No visible canal hematoma. Disc levels: Multilevel degenerative changes with disc space narrowing and endplate irregularity and spurring. Upper chest: Emphysema.  Biapical subpleural scarring. Other: Bilateral carotid bulb calcified plaques. IMPRESSION: 1. Large intraparenchymal hemorrhage involving the posterior right frontal lobe with moderate amount of surrounding edema and associated mass effect on the right lateral ventricle  and proximally 11 mm right to left midline shift. 2. Ill-defined hypodense area in the right thalamus may represent edema or metastatic disease. Further evaluation with MRI without and with contrast recommended. 3. No acute/traumatic cervical spine pathology. Multilevel degenerative changes. These results were called by telephone at  the time of interpretation on 05/25/2021 at 5:30 pm to Dr. Kerman Passey, who verbally acknowledged these results. Electronically Signed   By: Anner Crete M.D.   On: 05/25/2021 17:54   CT Cervical Spine Wo Contrast  Result Date: 05/25/2021 CLINICAL DATA:  Altered mental status. EXAM: CT HEAD WITHOUT CONTRAST CT CERVICAL SPINE WITHOUT CONTRAST TECHNIQUE: Multidetector CT imaging of the head and cervical spine was performed following the standard protocol without intravenous contrast. Multiplanar CT image reconstructions of the cervical spine were also generated. COMPARISON:  None. FINDINGS: CT HEAD FINDINGS Brain: There is a large intraparenchymal hemorrhage involving the posterior right frontal lobe extending into the temporal and measuring approximately 3.6 x 2.7 cm in greatest axial dimensions. There is moderate amount of surrounding edema with associated mass effect on the right lateral ventricle and proximally 11 mm right to left midline shift. There may be extension of a small amount of blood into the right thalamus. There is a 1.5 x 1.1 cm ill-defined hypodense area in the right thalamus which may represent edema or metastatic disease. Additionally there is some edema surrounding the occipital horn of the left lateral ventricle. Findings may represent metastatic disease. Further evaluation with MRI without and with contrast recommended. There is mild dilatation of the left lateral ventricle which may represent a degree of entrapment. Vascular: No hyperdense vessel or unexpected calcification. Skull: Normal. Negative for fracture or focal lesion. Sinuses/Orbits: No acute finding. Other: None CT CERVICAL SPINE FINDINGS Alignment: No acute subluxation. There is reversal of normal cervical lordosis which may be positional or due to muscle spasm. Skull base and vertebrae: No acute fracture. Soft tissues and spinal canal: No prevertebral fluid or swelling. No visible canal hematoma. Disc levels: Multilevel  degenerative changes with disc space narrowing and endplate irregularity and spurring. Upper chest: Emphysema.  Biapical subpleural scarring. Other: Bilateral carotid bulb calcified plaques. IMPRESSION: 1. Large intraparenchymal hemorrhage involving the posterior right frontal lobe with moderate amount of surrounding edema and associated mass effect on the right lateral ventricle and proximally 11 mm right to left midline shift. 2. Ill-defined hypodense area in the right thalamus may represent edema or metastatic disease. Further evaluation with MRI without and with contrast recommended. 3. No acute/traumatic cervical spine pathology. Multilevel degenerative changes. These results were called by telephone at the time of interpretation on 05/25/2021 at 5:30 pm to Dr. Kerman Passey, who verbally acknowledged these results. Electronically Signed   By: Anner Crete M.D.   On: 05/25/2021 17:54   MR MRA HEAD WO CONTRAST  Result Date: 05/26/2021 CLINICAL DATA:  Follow-up examination for intracranial hemorrhage. EXAM: MRI HEAD WITHOUT CONTRAST MRA HEAD WITHOUT CONTRAST TECHNIQUE: Multiplanar, multi-echo pulse sequences of the brain and surrounding structures were acquired without intravenous contrast. Angiographic images of the Circle of Willis were acquired using MRA technique without intravenous contrast. COMPARISON:  Comparison made with prior CT from 05/25/2021. FINDINGS: MRI HEAD FINDINGS Brain: Examination moderately to severely degraded by motion artifact. Large intraparenchymal hemorrhage centered at the right frontotemporal region again seen hematoma measures approximately 6.0 x 5.3 x 3.4 cm in greatest dimensions (estimated volume 54 mL). Hematoma extends anteriorly and inferiorly towards the right temporal pole. Medial extension to involve the right basal  ganglia and thalamus, with extension into the right cerebral peduncle and right midbrain (series 20, image 13). Scattered small volume subarachnoid  hemorrhage noted within the adjacent posterior parietotemporal region (series 10, image 5). Frontotemporal region surrounding vasogenic edema with regional mass effect. Right lateral ventricle is partially effaced. Associated right-to-left shift measures up to 5 mm, similar to previous. No visible intraventricular extension of hemorrhage. Asymmetric dilatation of the left lateral ventricle with periventricular edema, compatible with trapping and transependymal flow of CSF. Basilar cisterns are mildly crowded but remain patent. Remainder of the brain is otherwise relatively normal in appearance. No other acute or subacute infarct. No other foci of susceptibility artifact to suggest acute or chronic intracranial hemorrhage. No visible mass lesion or extra-axial fluid collection. Pituitary gland suprasellar region within normal limits. Vascular: Major intracranial vascular flow voids are maintained at the skull base. Skull and upper cervical spine: Craniocervical junction within normal limits. Bone marrow signal intensity normal. No visible focal marrow replacing lesion. No scalp soft tissue abnormality. Sinuses/Orbits: Globes and orbital soft tissues grossly within normal limits. Paranasal sinuses are largely clear. No significant mastoid effusion. Other: None. MRA HEAD FINDINGS Anterior circulation: Examination moderately to severely degraded by motion artifact. Visualized distal cervical segments of the internal carotid arteries are patent with antegrade flow. Petrous, cavernous, and supraclinoid segments patent without visible stenosis or other definite abnormality. A1 segments patent bilaterally. Grossly normal anterior communicating artery complex. Anterior cerebral arteries perfused to their distal aspects without appreciable stenosis. No visible M1 stenosis. Grossly normal MCA bifurcations. Distal MCA branches perfused and fairly symmetric. Mass effect on the distal right MCA branches related to the right  cerebral hemorrhage noted. Posterior circulation: Visualized distal V4 segments patent without stenosis. Left vertebral artery dominant. Neither PICA origin visualized. Basilar grossly patent to its distal aspect without stenosis. Superior cerebellar arteries patent bilaterally. Left PCA supplied via the basilar. Fetal type origin of the right PCA. Both PCAs grossly patent to their distal aspects without appreciable stenosis. Anatomic variants: Fetal type origin of the right PCA. Other: No visible AVM or other vascular malformation seen underlying the right cerebral hemorrhage. No appreciable aneurysm on this motion degraded exam. IMPRESSION: MRI HEAD IMPRESSION: 1. Motion degraded exam. 2. Large intraparenchymal hemorrhage centered at the right frontotemporal region, estimated volume 54 mL. Medial extension to involve the right basal ganglia, thalamus, right cerebral peduncle, and midbrain. No visible underlying lesion or other structural abnormality on this motion degraded noncontrast examination. Finding could reflect a hemorrhagic infarct and/or hemorrhagic transformation given location and overall morphology. 3. Associated regional mass effect with up to 5 mm of right-to-left shift, similar to previous. Asymmetric dilatation of the left lateral ventricle with associated transependymal flow of CSF consistent with a degree of ventricular trapping. 4. Associated small volume subarachnoid hemorrhage within the adjacent posterior right parietotemporal region. MRA HEAD IMPRESSION: 1. Motion degraded exam. 2. Grossly negative intracranial MRA. No visible AVM or other vascular malformation seen underlying the right cerebral hemorrhage. Electronically Signed   By: Jeannine Boga M.D.   On: 05/26/2021 03:20   MR BRAIN WO CONTRAST  Result Date: 05/26/2021 CLINICAL DATA:  Follow-up examination for intracranial hemorrhage. EXAM: MRI HEAD WITHOUT CONTRAST MRA HEAD WITHOUT CONTRAST TECHNIQUE: Multiplanar,  multi-echo pulse sequences of the brain and surrounding structures were acquired without intravenous contrast. Angiographic images of the Circle of Willis were acquired using MRA technique without intravenous contrast. COMPARISON:  Comparison made with prior CT from 05/25/2021. FINDINGS: MRI HEAD FINDINGS Brain: Examination moderately  to severely degraded by motion artifact. Large intraparenchymal hemorrhage centered at the right frontotemporal region again seen hematoma measures approximately 6.0 x 5.3 x 3.4 cm in greatest dimensions (estimated volume 54 mL). Hematoma extends anteriorly and inferiorly towards the right temporal pole. Medial extension to involve the right basal ganglia and thalamus, with extension into the right cerebral peduncle and right midbrain (series 20, image 13). Scattered small volume subarachnoid hemorrhage noted within the adjacent posterior parietotemporal region (series 10, image 5). Frontotemporal region surrounding vasogenic edema with regional mass effect. Right lateral ventricle is partially effaced. Associated right-to-left shift measures up to 5 mm, similar to previous. No visible intraventricular extension of hemorrhage. Asymmetric dilatation of the left lateral ventricle with periventricular edema, compatible with trapping and transependymal flow of CSF. Basilar cisterns are mildly crowded but remain patent. Remainder of the brain is otherwise relatively normal in appearance. No other acute or subacute infarct. No other foci of susceptibility artifact to suggest acute or chronic intracranial hemorrhage. No visible mass lesion or extra-axial fluid collection. Pituitary gland suprasellar region within normal limits. Vascular: Major intracranial vascular flow voids are maintained at the skull base. Skull and upper cervical spine: Craniocervical junction within normal limits. Bone marrow signal intensity normal. No visible focal marrow replacing lesion. No scalp soft tissue  abnormality. Sinuses/Orbits: Globes and orbital soft tissues grossly within normal limits. Paranasal sinuses are largely clear. No significant mastoid effusion. Other: None. MRA HEAD FINDINGS Anterior circulation: Examination moderately to severely degraded by motion artifact. Visualized distal cervical segments of the internal carotid arteries are patent with antegrade flow. Petrous, cavernous, and supraclinoid segments patent without visible stenosis or other definite abnormality. A1 segments patent bilaterally. Grossly normal anterior communicating artery complex. Anterior cerebral arteries perfused to their distal aspects without appreciable stenosis. No visible M1 stenosis. Grossly normal MCA bifurcations. Distal MCA branches perfused and fairly symmetric. Mass effect on the distal right MCA branches related to the right cerebral hemorrhage noted. Posterior circulation: Visualized distal V4 segments patent without stenosis. Left vertebral artery dominant. Neither PICA origin visualized. Basilar grossly patent to its distal aspect without stenosis. Superior cerebellar arteries patent bilaterally. Left PCA supplied via the basilar. Fetal type origin of the right PCA. Both PCAs grossly patent to their distal aspects without appreciable stenosis. Anatomic variants: Fetal type origin of the right PCA. Other: No visible AVM or other vascular malformation seen underlying the right cerebral hemorrhage. No appreciable aneurysm on this motion degraded exam. IMPRESSION: MRI HEAD IMPRESSION: 1. Motion degraded exam. 2. Large intraparenchymal hemorrhage centered at the right frontotemporal region, estimated volume 54 mL. Medial extension to involve the right basal ganglia, thalamus, right cerebral peduncle, and midbrain. No visible underlying lesion or other structural abnormality on this motion degraded noncontrast examination. Finding could reflect a hemorrhagic infarct and/or hemorrhagic transformation given location and  overall morphology. 3. Associated regional mass effect with up to 5 mm of right-to-left shift, similar to previous. Asymmetric dilatation of the left lateral ventricle with associated transependymal flow of CSF consistent with a degree of ventricular trapping. 4. Associated small volume subarachnoid hemorrhage within the adjacent posterior right parietotemporal region. MRA HEAD IMPRESSION: 1. Motion degraded exam. 2. Grossly negative intracranial MRA. No visible AVM or other vascular malformation seen underlying the right cerebral hemorrhage. Electronically Signed   By: Jeannine Boga M.D.   On: 05/26/2021 03:20   MR BRAIN W WO CONTRAST  Result Date: 05/26/2021 CLINICAL DATA:  Acute neuro deficit, acute, stroke suspected. Follow-up intracranial hemorrhage. EXAM: MRI  HEAD WITHOUT AND WITH CONTRAST TECHNIQUE: Multiplanar, multiecho pulse sequences of the brain and surrounding structures were obtained without and with intravenous contrast. CONTRAST:  36m GADAVIST GADOBUTROL 1 MMOL/ML IV SOLN COMPARISON:  MRI earlier same day.  Head CT yesterday. FINDINGS: Brain: Redemonstration of an intraparenchymal hematoma on the right measuring 6.3 x 5.1 x 3.4 cm (volume = 57 cm^3), not visibly changed since the immediate prior exam. Small region of nonhemorrhagic acute infarction noted by diffusion imaging at the posterior frontal lobe superior to the hematoma. The hematoma is primarily localized in the right temporal and parietal region with medial extension into the right thalamus. Surrounding vasogenic edema appears similar. Mass-effect is similar with right-to-left shift of 5 mm. Gyriform enhancement occurs within the right posterior frontal and parietal region consistent with subacute ischemic insult. Elsewhere, there are chronic small-vessel ischemic changes of the pons and cerebral hemispheric white matter. No evidence of hydrocephalus or extra-axial collection. Left lateral ventricle is larger than the right,  probably because of mass effect upon the right ventricle. Ventricular trapping is less likely, particularly given this amount mass effect. Vascular: Major vessels at the base of the brain show flow. Skull and upper cervical spine: Negative Sinuses/Orbits: Clear/normal Other: None IMPRESSION: No change or progression since the prior study. 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 Degree of regional edema and swelling is similar with mass effect resulting in 5 mm of right-to-left shift. Ventricular size is stable. Postcontrast imaging shows gyriform enhancement in the frontoparietal region consistent with subacute infarction. Electronically Signed   By: MNelson ChimesM.D.   On: 05/26/2021 12:31   DG Pelvis Portable  Result Date: 05/25/2021 CLINICAL DATA:  Altered mental status and recent fall with pelvic pain, initial encounter EXAM: PORTABLE PELVIS 1-2 VIEWS COMPARISON:  None. FINDINGS: There is no evidence of pelvic fracture or diastasis. No pelvic bone lesions are seen. Mild colonic constipation is noted. IMPRESSION: No acute bony abnormality noted. Changes suggestive of mild colonic constipation. Electronically Signed   By: MInez CatalinaM.D.   On: 05/25/2021 18:34   DG Chest Port 1 View  Result Date: 05/25/2021 CLINICAL DATA:  Altered mental status. EXAM: PORTABLE CHEST 1 VIEW COMPARISON:  Chest radiograph dated 09/16/2013. FINDINGS: There is chronic interstitial coarsening. No focal consolidation, pleural effusion, pneumothorax. The cardiac silhouette is within limits atherosclerotic calcification of the aorta. Moderate size hiatal hernia increased since the prior radiograph. No acute osseous pathology. IMPRESSION: No acute cardiopulmonary process. Electronically Signed   By: AAnner CreteM.D.   On: 05/25/2021 18:35   ECHOCARDIOGRAM COMPLETE  Result Date: 05/27/2021    ECHOCARDIOGRAM REPORT   Patient Name:   Monica LaineDate of  Exam: 05/27/2021 Medical Rec #:  [id number]      Height:       61.0 in Accession #:    [id number]     Weight:       94.8 lb Date of Birth:  [birth year]      BSA:          1.376 m Patient Age:    89years        BP:           121/90 mmHg Patient Gender: F               HR:           84 bpm. Exam Location:  Inpatient Procedure: Cardiac Doppler, Color Doppler and  2D Echo Indications:    hemorrhagic stroke  History:        Patient has no prior history of Echocardiogram examinations.                 Malnutrition.  Sonographer:    Merrie Roof RDCS Referring Phys: [place]  1. Left ventricular ejection fraction, by estimation, is 60 to 65%. The left ventricle has normal function. The left ventricle has no regional wall motion abnormalities. Left ventricular diastolic function could not be evaluated.  2. Right ventricular systolic function is normal. The right ventricular size is normal. Tricuspid regurgitation signal is inadequate for assessing PA pressure.  3. Left atrial size was moderately dilated.  4. There is systolic anterior motion of the mitral valve with eccentric moderate to severe mitral regurgitation as a result. 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 No evidence of mitral stenosis.  5. The aortic valve is tricuspid. Aortic valve regurgitation is not visualized. No aortic stenosis is present.  6. The inferior vena cava is normal in size with greater than 50% respiratory variability, suggesting right atrial pressure of 3 mmHg. Comparison(s): No prior Echocardiogram. 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 There is systolic anterior motion of the mitral valve with a late systolic MR jet. Difficult to fully appreciate on current study, but based on splay  and color in left atrium, suspect moderate-severe. Would consider limited repeat echo in the future after medical therapy to see if [facility]  and MR improves. FINDINGS  Left Ventricle: Left ventricular ejection fraction, by estimation, is 60 to 65%. The left ventricle has normal function. The left ventricle has no regional wall motion abnormalities. The left ventricular internal cavity size was normal in size. There is  no left ventricular hypertrophy. Left ventricular diastolic function could not be evaluated. Right Ventricle: The right ventricular size is normal. No increase in right ventricular wall thickness. Right ventricular systolic function is normal. Tricuspid regurgitation signal is inadequate for assessing PA pressure. Left Atrium: Left atrial size was moderately dilated. Right Atrium: Right atrial size was normal in size. Pericardium: There is no evidence of pericardial effusion. Mitral Valve: There is systolic anterior motion of the mitral valve with eccentric moderate to severe mitral regurgitation as a result. 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 No evidence of mitral valve stenosis. Tricuspid Valve: The tricuspid valve is normal in structure. Tricuspid valve regurgitation is trivial. No evidence of tricuspid stenosis. Aortic Valve: The aortic valve is tricuspid. Aortic valve regurgitation is not visualized. No aortic stenosis is present. Pulmonic Valve: The pulmonic valve was not well visualized. Pulmonic valve regurgitation is not visualized. Aorta: The aortic root, ascending aorta, aortic arch and descending aorta are all structurally normal, with no evidence of dilitation or obstruction. Venous: The inferior vena cava is normal in size with greater than 50% respiratory variability, suggesting right atrial pressure of 3 mmHg. IAS/Shunts: The atrial septum is grossly normal.  LEFT VENTRICLE PLAX 2D LVIDd:         4.10 cm LVIDs:         2.70 cm LV PW:         1.00 cm LV IVS:        0.80 cm LVOT diam:     1.90 cm LVOT Area:     2.84 cm  RIGHT VENTRICLE RV Basal diam:  2.60 cm LEFT ATRIUM  Index        RIGHT ATRIUM          Index LA diam:        3.40 cm 2.47 cm/m   RA Area:     8.37 cm LA Vol (A2C):   61.5 ml 44.71 ml/m  RA Volume:   15.40 ml 11.20 ml/m LA Vol (A4C):   61.0 ml 44.34 ml/m LA Biplane Vol: 62.0 ml 45.07 ml/m   AORTA Ao Root diam: 3.20 cm MITRAL VALVE MV Area (PHT): 3.99 cm     SHUNTS MV Decel Time: 190 msec     Systemic Diam: 1.90 cm MR Peak grad: 192.1 mmHg MR Vmax:      693.00 cm/s MV E velocity: 79.10 cm/s MV A velocity: 130.00 cm/s MV E/A ratio:  0.61 Buford Dresser MD Electronically signed by Buford Dresser MD Signature Date/Time: 05/27/2021/6:51:05 PM    Final    VAS US CAROTID  Result Date: 05/27/2021 Carotid Arterial Duplex Study Patient Name:  Monica Miranda  Date of Exam:   05/26/2021 Medical Rec #: [id number]        Accession #:    [id number] Date of Birth: 28-Jun-1960        Patient Gender: F Patient Age:   78 years Exam Location:  [facility] Procedure:      VAS US CAROTID Referring Phys: Alferd Patee [facility] --------------------------------------------------------------------------------  Indications:  CVA. Risk Factors: None. Limitations   Today's exam was limited due to the patient's inability or               unwillingness to cooperate. Performing Technologist: Maudry Mayhew MHA, RDMS, RVT, RDCS  Examination Guidelines: A complete evaluation includes B-mode imaging, spectral Doppler, color Doppler, and power Doppler as needed of all accessible portions of each vessel. Bilateral testing is considered an integral part of a complete examination. Limited examinations for reoccurring indications may be performed as noted.  Right Carotid Findings: +----------+--------+--------+--------+---------------------+------------------+             PSV cm/s EDV cm/s Stenosis Plaque Description    Comments            +----------+--------+--------+--------+---------------------+------------------+  CCA Prox   82       21                                                           +----------+--------+--------+--------+---------------------+------------------+  CCA Distal 52       16                smooth and                                                                       heterogenous                              +----------+--------+--------+--------+---------------------+------------------+  ICA Prox   51       21  heterogenous, smooth                                                             and calcific                              +----------+--------+--------+--------+---------------------+------------------+  ICA Distal 61       26                                                          +----------+--------+--------+--------+---------------------+------------------+  ECA        67       15                                      intimal thickening  +----------+--------+--------+--------+---------------------+------------------+ +----------+--------+-------+----------------+-------------------+             PSV cm/s EDV cms Describe         Arm Pressure (mmHG)  +----------+--------+-------+----------------+-------------------+  Subclavian 113      91      Multiphasic, WNL                      +----------+--------+-------+----------------+-------------------+ +---------+--------+--+--------+--+---------+  Vertebral PSV cm/s 39 EDV cm/s 11 Antegrade  +---------+--------+--+--------+--+---------+  Left Carotid Findings: +----------+--------+--------+--------+--------------------------+--------+             PSV cm/s EDV cm/s Stenosis Plaque Description         Comments  +----------+--------+--------+--------+--------------------------+--------+  CCA Prox   66       24                                                     +----------+--------+--------+--------+--------------------------+--------+  CCA Distal 48       15                irregular and heterogenous           +----------+--------+--------+--------+--------------------------+--------+  ICA  Prox   45       20                smooth and heterogenous              +----------+--------+--------+--------+--------------------------+--------+  ICA Distal 56       24                                                     +----------+--------+--------+--------+--------------------------+--------+  ECA        67       20                                                     +----------+--------+--------+--------+--------------------------+--------+ +----------+--------+--------+----------------+-------------------+  PSV cm/s EDV cm/s Describe         Arm Pressure (mmHG)  +----------+--------+--------+----------------+-------------------+  Subclavian 179               Multiphasic, WNL                      +----------+--------+--------+----------------+-------------------+ +---------+--------+--+--------+--+---------+  Vertebral PSV cm/s 37 EDV cm/s 14 Antegrade  +---------+--------+--+--------+--+---------+   Summary: Right Carotid: Velocities in the right ICA are consistent with a 1-39% stenosis. Left Carotid: Velocities in the left ICA are consistent with a 1-39% stenosis. Vertebrals:  Bilateral vertebral arteries demonstrate antegrade flow. Subclavians: Normal flow hemodynamics were seen in bilateral subclavian              arteries. *See table(s) above for measurements and observations.  Electronically signed by Antony Contras MD on 05/27/2021 at 12:37:56 PM.    Final       Subjective: Patient seen and examined at bedside.  Denies new fever, nausea, vomiting.  Complains of lack of sleep.  Discharge Exam: Vitals:   05/29/21 0457 05/29/21 0801  BP: (!) 140/100 116/68  Pulse: 70 75  Resp: 16 15  Temp: 98.1 F (36.7 C) 98.3 F (36.8 C)  SpO2: 99% 100%    General: Pt is alert, awake, not in acute distress.  Currently on room air.  Poor historian.  Wakes up slightly, answers few questions.  Left-sided weakness present. Cardiovascular: rate controlled, S1/S2 + Respiratory: bilateral decreased  breath sounds at bases Abdominal: Soft, NT, ND, bowel sounds + Extremities: no edema, no cyanosis    The results of significant diagnostics from this hospitalization (including imaging, microbiology, ancillary and laboratory) are listed below for reference.     Microbiology: Recent Results (from the past 240 hour(s))  Resp Panel by RT-PCR (Flu A&B, Covid) Nasopharyngeal Swab     Status: None   Collection Time: 05/25/21  5:41 PM   Specimen: Nasopharyngeal Swab; Nasopharyngeal(NP) swabs in vial transport medium  Result Value Ref Range Status   SARS Coronavirus 2 by RT PCR NEGATIVE NEGATIVE Final    Comment: (NOTE) SARS-CoV-2 target nucleic acids are NOT DETECTED.  The SARS-CoV-2 RNA is generally detectable in upper respiratory specimens during the acute phase of infection. The lowest concentration of SARS-CoV-2 viral copies this assay can detect is 138 copies/mL. A negative result does not preclude SARS-Cov-2 infection and should not be used as the sole basis for treatment or other patient management decisions. A negative result may occur with  improper specimen collection/handling, submission of specimen other than nasopharyngeal swab, presence of viral mutation(s) within the areas targeted by this assay, and inadequate number of viral copies(<138 copies/mL). A negative result must be combined with clinical observations, patient history, and epidemiological information. The expected result is Negative.  Fact Sheet for Patients:  EntrepreneurPulse.com.au  Fact Sheet for Healthcare Providers:  IncredibleEmployment.be  This test is no t yet approved or cleared by the [place] FDA and  has been authorized for detection and/or diagnosis of SARS-CoV-2 by FDA under an Emergency Use Authorization (EUA). This EUA will remain  in effect (meaning this test can be used) for the duration of the COVID-19 declaration under Section 564(b)(1) of the  Act, 21 U.S.C.section 360bbb-3(b)(1), unless the authorization is terminated  or revoked sooner.       Influenza A by PCR NEGATIVE NEGATIVE Final   Influenza B by PCR NEGATIVE NEGATIVE Final    Comment: (NOTE) The Xpert Xpress SARS-CoV-2/FLU/RSV plus assay  is intended as an aid in the diagnosis of influenza from Nasopharyngeal swab specimens and should not be used as a sole basis for treatment. Nasal washings and aspirates are unacceptable for Xpert Xpress SARS-CoV-2/FLU/RSV testing.  Fact Sheet for Patients: EntrepreneurPulse.com.au  Fact Sheet for Healthcare Providers: IncredibleEmployment.be  This test is not yet approved or cleared by the [place] FDA and has been authorized for detection and/or diagnosis of SARS-CoV-2 by FDA under an Emergency Use Authorization (EUA). This EUA will remain in effect (meaning this test can be used) for the duration of the COVID-19 declaration under Section 564(b)(1) of the Act, 21 U.S.C. section 360bbb-3(b)(1), unless the authorization is terminated or revoked.  Performed at [facility], [address] [place], [place] [address]   MRSA Next Gen by PCR, Nasal     Status: None   Collection Time: 05/25/21 10:19 PM   Specimen: Nasal Mucosa; Nasal Swab  Result Value Ref Range Status   MRSA by PCR Next Gen NOT DETECTED NOT DETECTED Final    Comment: (NOTE) The GeneXpert MRSA Assay (FDA approved for NASAL specimens only), is one component of a comprehensive MRSA colonization surveillance program. It is not intended to diagnose MRSA infection nor to guide or monitor treatment for MRSA infections. Test performance is not FDA approved in patients less than 2 years old. Performed at [facility], [place] [address] [place], [place] [address]      Labs: BNP (last 3 results) No results for input(s): BNP in the last 8760 hours. Basic Metabolic Panel: Recent Labs  Lab 05/25/21 1645  05/26/21 0457 05/27/21 0109 05/28/21 0319  NA 139 137 136 138  K 3.7  --   --  3.5  CL 108  --   --  105  CO2 25  --   --  27  GLUCOSE 92  --   --  97  BUN 15  --   --  10  CREATININE 0.50  --   --  0.47  CALCIUM 9.2  --   --  8.7*   Liver Function Tests: Recent Labs  Lab 05/25/21 1645 05/28/21 0319  AST 36 37  ALT 39 35  ALKPHOS 138* 118  BILITOT 0.4 0.4  PROT 6.0* 5.0*  ALBUMIN 3.5 2.8*   No results for input(s): LIPASE, AMYLASE in the last 168 hours. No results for input(s): AMMONIA in the last 168 hours. CBC: Recent Labs  Lab 05/25/21 1645 05/28/21 0319  WBC 7.7 5.1  NEUTROABS 5.1  --   HGB 14.6 12.6  HCT 44.2 39.4  MCV 86.3 87.9  PLT 384 249   Cardiac Enzymes: No results for input(s): CKTOTAL, CKMB, CKMBINDEX, TROPONINI in the last 168 hours. BNP: Invalid input(s): POCBNP CBG: No results for input(s): GLUCAP in the last 168 hours. D-Dimer No results for input(s): DDIMER in the last 72 hours. Hgb A1c No results for input(s): HGBA1C in the last 72 hours. Lipid Profile No results for input(s): CHOL, HDL, LDLCALC, TRIG, CHOLHDL, LDLDIRECT in the last 72 hours. Thyroid function studies No results for input(s): TSH, T4TOTAL, T3FREE, THYROIDAB in the last 72 hours.  Invalid input(s): FREET3 Anemia work up No results for input(s): VITAMINB12, FOLATE, FERRITIN, TIBC, IRON, RETICCTPCT in the last 72 hours. Urinalysis    Component Value Date/Time   COLORURINE AMBER (A) 08/10/2011 0920   APPEARANCEUR CLOUDY (A) 08/10/2011 0920   LABSPEC 1.028 08/10/2011 0920   PHURINE 5.5 08/10/2011 0920   GLUCOSEU NEGATIVE 08/10/2011 0920  HGBUR NEGATIVE 08/10/2011 0920   BILIRUBINUR SMALL (A) 08/10/2011 0920   KETONESUR 15 (A) 08/10/2011 0920   PROTEINUR NEGATIVE 08/10/2011 0920   UROBILINOGEN 1.0 08/10/2011 0920   NITRITE POSITIVE (A) 08/10/2011 0920   LEUKOCYTESUR NEGATIVE 08/10/2011 0920   Sepsis Labs Invalid input(s): PROCALCITONIN,  WBC,   LACTICIDVEN Microbiology Recent Results (from the past 240 hour(s))  Resp Panel by RT-PCR (Flu A&B, Covid) Nasopharyngeal Swab     Status: None   Collection Time: 05/25/21  5:41 PM   Specimen: Nasopharyngeal Swab; Nasopharyngeal(NP) swabs in vial transport medium  Result Value Ref Range Status   SARS Coronavirus 2 by RT PCR NEGATIVE NEGATIVE Final    Comment: (NOTE) SARS-CoV-2 target nucleic acids are NOT DETECTED.  The SARS-CoV-2 RNA is generally detectable in upper respiratory specimens during the acute phase of infection. The lowest concentration of SARS-CoV-2 viral copies this assay can detect is 138 copies/mL. A negative result does not preclude SARS-Cov-2 infection and should not be used as the sole basis for treatment or other patient management decisions. A negative result may occur with  improper specimen collection/handling, submission of specimen other than nasopharyngeal swab, presence of viral mutation(s) within the areas targeted by this assay, and inadequate number of viral copies(<138 copies/mL). A negative result must be combined with clinical observations, patient history, and epidemiological information. The expected result is Negative.  Fact Sheet for Patients:  EntrepreneurPulse.com.au  Fact Sheet for Healthcare Providers:  IncredibleEmployment.be  This test is no t yet approved or cleared by the [place] FDA and  has been authorized for detection and/or diagnosis of SARS-CoV-2 by FDA under an Emergency Use Authorization (EUA). This EUA will remain  in effect (meaning this test can be used) for the duration of the COVID-19 declaration under Section 564(b)(1) of the Act, 21 U.S.C.section 360bbb-3(b)(1), unless the authorization is terminated  or revoked sooner.       Influenza A by PCR NEGATIVE NEGATIVE Final   Influenza B by PCR NEGATIVE NEGATIVE Final    Comment: (NOTE) The Xpert Xpress SARS-CoV-2/FLU/RSV plus  assay is intended as an aid in the diagnosis of influenza from Nasopharyngeal swab specimens and should not be used as a sole basis for treatment. Nasal washings and aspirates are unacceptable for Xpert Xpress SARS-CoV-2/FLU/RSV testing.  Fact Sheet for Patients: EntrepreneurPulse.com.au  Fact Sheet for Healthcare Providers: IncredibleEmployment.be  This test is not yet approved or cleared by the [place] FDA and has been authorized for detection and/or diagnosis of SARS-CoV-2 by FDA under an Emergency Use Authorization (EUA). This EUA will remain in effect (meaning this test can be used) for the duration of the COVID-19 declaration under Section 564(b)(1) of the Act, 21 U.S.C. section 360bbb-3(b)(1), unless the authorization is terminated or revoked.  Performed at [facility], [address] [place], [place] [address]   MRSA Next Gen by PCR, Nasal     Status: None   Collection Time: 05/25/21 10:19 PM   Specimen: Nasal Mucosa; Nasal Swab  Result Value Ref Range Status   MRSA by PCR Next Gen NOT DETECTED NOT DETECTED Final    Comment: (NOTE) The GeneXpert MRSA Assay (FDA approved for NASAL specimens only), is one component of a comprehensive MRSA colonization surveillance program. It is not intended to diagnose MRSA infection nor to guide or monitor treatment for MRSA infections. Test performance is not FDA approved in patients less than 70 years old. Performed at [facility], [place] [address], [place], [place]  [address]      Time coordinating discharge: 35 minutes  SIGNED:   Aline August, MD  [facility] 05/29/2021, 9:38 AM

## 2021-05-29 NOTE — Progress Notes (Signed)
Patient presented to unit via hospital bed accompanied by transferring unit staff. Spoke with family and daughter in law has been at bed side and point of contact. Family stated that patient is unable to relay what is going on. Cletis Media, LPN

## 2021-05-29 NOTE — H&P (Signed)
Physical Medicine and Rehabilitation Admission H&P       HPI: Monica Miranda is a 60 year old right-handed female with history of hypertension, hyperlipidemia, COPD/tobacco use as well as polysubstance abuse.  Per chart review patient had been living  alone in [place] for the last 2 years and reportedly independent.  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  ICH was stable on multiple repeat CTs.  Her drug screen was positive for cocaine and amphetamines as well as a long history of alcohol use.  She was started on hypertonic saline monitor closely in the ICU required nicardipine drip.  She was discharged on salt tablets 2 g every 8 hours with sodium greater than 140 as well as started on Keppra 500 twice daily for seizure prophylaxis.  She was discharged planning to live with her son and daughter in law in [place] and on the drive from [place] noted increased left-sided weakness as well as altered mental status while driving through [place].  She did not seek attention for that and made it home and  presented to [facility] 05/26/2021.  A CT of the head as well as cervical spine showed large intraparenchymal hemorrhage involving posterior right frontal lobe moderate amount of surrounding edema and associated mass-effect on the right lateral ventricle and proximally 11 mm right to left midline shift.  No acute traumatic cervical spine pathology.  MRA/MRI large intraparenchymal hemorrhages right frontotemporal region estimated volume 54 mL.  Medial extension to involve right basal ganglia thalamus right cerebral peduncle and midbrain.  No visible underlying lesion or structural abnormality.  Associated regional mass-effect 5 mm right to left shift as well as associated small volume subarachnoid hemorrhage within the adjacent posterior right  parietal temporal region.  Echocardiogram with ejection fraction of 60 to 65% no wall motion abnormality.  Admission chemistries were unremarkable except alkaline phosphatase 138, troponin negative, urine drug screen negative.  She remains on Keppra for seizure prophylaxis.  Tolerating a regular diet.  Therapy evaluations completed due to patient's left-sided weakness decreased functional mobility was admitted for a comprehensive rehab program.   Review of Systems  Constitutional:  Negative for chills and fever.  HENT:  Negative for hearing loss.   Eyes:  Negative for blurred vision and double vision.  Respiratory:  Negative for cough and shortness of breath.   Cardiovascular:  Negative for chest pain and palpitations.  Gastrointestinal:  Positive for constipation. Negative for heartburn, nausea and vomiting.  Genitourinary:  Negative for dysuria, flank pain and hematuria.  Musculoskeletal:  Positive for back pain and myalgias.  Skin:  Negative for rash.  Neurological:  Positive for dizziness, weakness and headaches.  All other systems reviewed and are negative.     Past Medical History:  Diagnosis Date   COPD (chronic obstructive pulmonary disease) (HCC)     Rib fracture      4 months ago   Stroke [facility])           Past Surgical History:  Procedure Laterality Date   ESOPHAGEAL DILATION        No family history on file. Social History:  reports that she has been smoking cigarettes. She has been smoking an average of .5 packs per day. She has never used smokeless tobacco. She reports current alcohol use of about 1.0 standard drink per week. She reports current drug use. Drugs: Marijuana  and Cocaine. Allergies:      Allergies  Allergen Reactions   Codeine Rash          Medications Prior to Admission  Medication Sig Dispense Refill   acetaminophen (TYLENOL) 500 MG tablet Take 500 mg by mouth every 6 (six) hours as needed.       albuterol (PROVENTIL HFA;VENTOLIN HFA) 108 (90 BASE)  MCG/ACT inhaler Inhale 1-2 puffs into the lungs every 6 (six) hours as needed for wheezing. (Patient not taking: Reported on 05/25/2021) 1 Inhaler 0   albuterol (PROVENTIL HFA;VENTOLIN HFA) 108 (90 BASE) MCG/ACT inhaler Inhale 1-2 puffs into the lungs every 6 (six) hours as needed for wheezing or shortness of breath. (Patient not taking: Reported on 05/25/2021) 1 Inhaler 0   amoxicillin (AMOXIL) 500 MG capsule Take 1 capsule (500 mg total) by mouth 3 (three) times daily. (Patient not taking: Reported on 05/25/2021) 30 capsule 0   cyanocobalamin 500 MCG tablet Take 500 mcg by mouth daily. (Patient not taking: Reported on 05/25/2021)       cyclobenzaprine (FLEXERIL) 10 MG tablet Take 5 mg by mouth 3 (three) times daily as needed for muscle spasms.       doxycycline (VIBRAMYCIN) 100 MG capsule Take 1 capsule (100 mg total) by mouth 2 (two) times daily. One po bid x 7 days (Patient not taking: Reported on 05/25/2021) 14 capsule 0   folic acid (FOLVITE) 1 MG tablet Take 1 mg by mouth daily.       levETIRAcetam (KEPPRA) 100 MG/ML solution Take 500 mg by mouth every 12 (twelve) hours.       lisinopril (ZESTRIL) 20 MG tablet Take 20 mg by mouth daily.       Melatonin 10 MG CAPS Take 1 capsule by mouth at bedtime.       Multiple Vitamins-Minerals (MULTIVITAMINS THER. W/MINERALS) TABS Take 1 tablet by mouth daily.   (Patient not taking: Reported on 05/25/2021)       ondansetron (ZOFRAN) 4 MG tablet Take 1 tablet (4 mg total) by mouth every 8 (eight) hours as needed for nausea or vomiting. (Patient not taking: Reported on 05/25/2021) 10 tablet 0   oxyCODONE-acetaminophen (PERCOCET) 5-325 MG tablet Take 1-2 tablets by mouth every 4 (four) hours as needed. (Patient not taking: Reported on 05/25/2021) 20 tablet 0   sodium chloride 1 g tablet Take 1 g by mouth every 12 (twelve) hours.       traMADol (ULTRAM) 50 MG tablet Take 1 tablet (50 mg total) by mouth every 6 (six) hours as needed. (Patient not taking: Reported  on 05/25/2021) 15 tablet 0      Drug Regimen Review Drug regimen was reviewed and remains appropriate with no significant issues identified   Home: Home Living Family/patient expects to be discharged to:: Private residence Living Arrangements: Children Available Help at Discharge: Family, Available 24 hours/day Type of Home: House Home Access: Stairs to enter [employer] of Steps: 2 Entrance Stairs-Rails: Right, Left Home Layout: One level Bathroom Shower/Tub: Chiropodist: Standard Home Equipment: None   Functional History: Prior Function Prior Level of Function : Independent/Modified Independent   Functional Status:  Mobility: Bed Mobility Overal bed mobility: Needs Assistance Bed Mobility: Supine to Sit Supine to sit: Mod assist General bed mobility comments: ModA bringing L LE off bed and trunk elevation Transfers Overall transfer level: Needs assistance Equipment used: 2 person hand held assist Transfers: Sit to/from Stand, Bed to chair/wheelchair/BSC Sit to Stand: Mod assist, +2 physical  assistance Bed to/from chair/wheelchair/BSC transfer type:: Step pivot Step pivot transfers: Mod assist, +2 physical assistance General transfer comment: modA+2 for sit to stand from EOB. Blocking L knee and cues required for sequencing. Patient able to take steps towards chair but modA+2 to maintain standing balance and cues for sequencing   ADL: ADL Overall ADL's : Needs assistance/impaired Eating/Feeding: Minimal assistance, Cueing for compensatory techinques Grooming: Wash/dry hands, Wash/dry face, Minimal assistance, Cueing for compensatory techniques, Sitting Upper Body Bathing: Moderate assistance, Bed level Lower Body Bathing: Total assistance, Bed level Upper Body Dressing : Moderate assistance, Sitting Lower Body Dressing: Total assistance, Bed level Toilet Transfer: Moderate assistance, +2 for physical assistance    Cognition: Cognition Overall Cognitive Status: Impaired/Different from baseline Orientation Level: Oriented X4 Cognition Arousal/Alertness: Awake/alert Behavior During Therapy: WFL for tasks assessed/performed Overall Cognitive Status: Impaired/Different from baseline Area of Impairment: Orientation, Attention, Memory, Following commands, Safety/judgement, Awareness, Problem solving Orientation Level: Disoriented to, Place, Time Current Attention Level: Sustained Memory: Decreased recall of precautions, Decreased short-term memory Following Commands: Follows one step commands with increased time Safety/Judgement: Decreased awareness of safety Awareness: Emergent Problem Solving: Slow processing, Difficulty sequencing, Requires verbal cues General Comments: states that she is in [place] and it's January 2012. Following commands with increased time but easily distracted by being cold. cues required for redirection.   Physical Exam: Blood pressure 136/80, pulse 75, temperature 98 F (36.7 C), temperature source Oral, resp. rate (!) 29, SpO2 95 %. Physical Exam Constitutional:      General: She is not in acute distress.    Appearance: She is ill-appearing.     Comments: Looks older than age  HENT:     Head: Normocephalic.     Mouth/Throat:     Mouth: Mucous membranes are moist.     Comments: Missing teeth Eyes:     Extraocular Movements: Extraocular movements intact.     Pupils: Pupils are equal, round, and reactive to light.  Cardiovascular:     Rate and Rhythm: Normal rate and regular rhythm.     Heart sounds: No murmur heard.   No gallop.  Pulmonary:     Effort: Pulmonary effort is normal. No respiratory distress.     Breath sounds: No wheezing.  Abdominal:     General: Bowel sounds are normal. There is distension.     Palpations: Abdomen is soft.     Tenderness: There is no abdominal tenderness.  Musculoskeletal:        General: No swelling or tenderness. Normal range  of motion.     Cervical back: Normal range of motion and neck supple.  Skin:    General: Skin is warm and dry.  Neurological:     Comments: Patient is alert makes eye contact with examiner.  Left central 7. Speech sl dysarthric. Distracted, decreased concentration. Poor insight and awareness. Moves right side freely 4/5. LUE 1 to 1+/5 prox to distal. LLE 1+ to 2- HE, KE to 0/5 distally. Decreased LT/pain left arm and leg. DTR's 3+ LLE, LUE. Toes up. Early extensor tone LLE, flexor tone LUE. Left inattention  Psychiatric:     Comments: Anxious and distracted      Lab Results Last 48 Hours        Results for orders placed or performed during the hospital encounter of 05/25/21 (from the past 48 hour(s))  Sodium     Status: None    Collection Time: 05/27/21  1:09 AM  Result Value Ref Range    Sodium  136 135 - 145 mmol/L      Comment: Performed at [facility], [place] [address] [place], [place] [address]  Comprehensive metabolic panel     Status: Abnormal    Collection Time: 05/28/21  3:19 AM  Result Value Ref Range    Sodium 138 135 - 145 mmol/L    Potassium 3.5 3.5 - 5.1 mmol/L    Chloride 105 98 - 111 mmol/L    CO2 27 22 - 32 mmol/L    Glucose, Bld 97 70 - 99 mg/dL      Comment: Glucose reference range applies only to samples taken after fasting for at least 8 hours.    BUN 10 8 - 23 mg/dL    Creatinine, Ser 0.47 0.44 - 1.00 mg/dL    Calcium 8.7 (L) 8.9 - 10.3 mg/dL    Total Protein 5.0 (L) 6.5 - 8.1 g/dL    Albumin 2.8 (L) 3.5 - 5.0 g/dL    AST 37 15 - 41 U/L    ALT 35 0 - 44 U/L    Alkaline Phosphatase 118 38 - 126 U/L    Total Bilirubin 0.4 0.3 - 1.2 mg/dL    GFR, Estimated >60 >60 mL/min      Comment: (NOTE) Calculated using the CKD-EPI Creatinine Equation (2021)      Anion gap 6 5 - 15      Comment: Performed at [facility], [place] [address], [place], [place] [address]  CBC     Status: None    Collection Time: 05/28/21  3:19 AM  Result Value Ref Range    WBC  5.1 4.0 - 10.5 K/uL    RBC 4.48 3.87 - 5.11 MIL/uL    Hemoglobin 12.6 12.0 - 15.0 g/dL    HCT 39.4 36.0 - 46.0 %    MCV 87.9 80.0 - 100.0 fL    MCH 28.1 26.0 - 34.0 pg    MCHC 32.0 30.0 - 36.0 g/dL    RDW 15.2 11.5 - 15.5 %    Platelets 249 150 - 400 K/uL    nRBC 0.0 0.0 - 0.2 %      Comment: Performed at [facility], [place] [address], [place], [place] [address]       Imaging Results (Last 48 hours)  MR BRAIN W WO CONTRAST   Result Date: 05/26/2021 CLINICAL DATA:  Acute neuro deficit, acute, stroke suspected. Follow-up intracranial hemorrhage. EXAM: MRI HEAD WITHOUT AND WITH CONTRAST TECHNIQUE: Multiplanar, multiecho pulse sequences of the brain and surrounding structures were obtained without and with intravenous contrast. CONTRAST:  62mL GADAVIST GADOBUTROL 1 MMOL/ML IV SOLN COMPARISON:  MRI earlier same day.  Head CT yesterday. FINDINGS: Brain: Redemonstration of an intraparenchymal hematoma on the right measuring 6.3 x 5.1 x 3.4 cm (volume = 57 cm^3), not visibly changed since the immediate prior exam. Small region of nonhemorrhagic acute infarction noted by diffusion imaging at the posterior frontal lobe superior to the hematoma. The hematoma is primarily localized in the right temporal and parietal region with medial extension into the right thalamus. Surrounding vasogenic edema appears similar. Mass-effect is similar with right-to-left shift of 5 mm. Gyriform enhancement occurs within the right posterior frontal and parietal region consistent with subacute ischemic insult. Elsewhere, there are chronic small-vessel ischemic changes of the pons and cerebral hemispheric white matter. No evidence of hydrocephalus or extra-axial collection. Left lateral ventricle is larger than the right, probably because of mass effect upon the right  ventricle. Ventricular trapping is less likely, particularly given this amount mass effect. Vascular: Major vessels at the base of the brain show flow. Skull  and upper cervical spine: Negative Sinuses/Orbits: Clear/normal Other: None IMPRESSION: No change or progression since the prior study. 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 Degree of regional edema and swelling is similar with mass effect resulting in 5 mm of right-to-left shift. Ventricular size is stable. Postcontrast imaging shows gyriform enhancement in the frontoparietal region consistent with subacute infarction. Electronically Signed   By: Nelson Chimes M.D.   On: 05/26/2021 12:31    ECHOCARDIOGRAM COMPLETE   Result Date: 05/27/2021    ECHOCARDIOGRAM REPORT   Patient Name:   Cathren Laine Date of Exam: 05/27/2021 Medical Rec #:  [id number]       Height:       61.0 in Accession #:    [id number]      Weight:       94.8 lb Date of Birth:  [birth year]       BSA:          1.376 m Patient Age:    33 years        BP:           121/90 mmHg Patient Gender: F               HR:           84 bpm. Exam Location:  Inpatient Procedure: Cardiac Doppler, Color Doppler and 2D Echo Indications:    hemorrhagic stroke  History:        Patient has no prior history of Echocardiogram examinations.                 Malnutrition.  Sonographer:    Merrie Roof RDCS Referring Phys: Kenneth  1. Left ventricular ejection fraction, by estimation, is 60 to 65%. The left ventricle has normal function. The left ventricle has no regional wall motion abnormalities. Left ventricular diastolic function could not be evaluated.  2. Right ventricular systolic function is normal. The right ventricular size is normal. Tricuspid regurgitation signal is inadequate for assessing PA pressure.  3. Left atrial size was moderately dilated.  4. There is systolic anterior motion of the mitral valve with eccentric moderate to severe mitral regurgitation as a result. 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 No evidence of mitral  stenosis.  5. The aortic valve is tricuspid. Aortic valve regurgitation is not visualized. No aortic stenosis is present.  6. The inferior vena cava is normal in size with greater than 50% respiratory variability, suggesting right atrial pressure of 3 mmHg. Comparison(s): No prior Echocardiogram. 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 There is systolic anterior motion of the mitral valve with a late systolic MR jet. Difficult to fully appreciate on current study, but based on splay  and color in left atrium, suspect moderate-severe. Would consider limited repeat echo in the future after medical therapy to see if [facility] and MR improves. FINDINGS  Left Ventricle: Left ventricular ejection fraction, by estimation, is 60 to 65%. The left ventricle has normal function. The left ventricle has no regional wall motion abnormalities. The left ventricular internal cavity size was normal in size. There is  no left ventricular hypertrophy. Left ventricular diastolic function could not be evaluated. Right Ventricle: The right ventricular size is normal. No increase in right ventricular wall  thickness. Right ventricular systolic function is normal. Tricuspid regurgitation signal is inadequate for assessing PA pressure. Left Atrium: Left atrial size was moderately dilated. Right Atrium: Right atrial size was normal in size. Pericardium: There is no evidence of pericardial effusion. Mitral Valve: There is systolic anterior motion of the mitral valve with eccentric moderate to severe mitral regurgitation as a result. 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 No evidence of mitral valve stenosis. Tricuspid Valve: The tricuspid valve is normal in structure. Tricuspid valve regurgitation is trivial. No evidence of tricuspid stenosis. Aortic Valve: The aortic valve is tricuspid. Aortic valve regurgitation is not visualized. No aortic  stenosis is present. Pulmonic Valve: The pulmonic valve was not well visualized. Pulmonic valve regurgitation is not visualized. Aorta: The aortic root, ascending aorta, aortic arch and descending aorta are all structurally normal, with no evidence of dilitation or obstruction. Venous: The inferior vena cava is normal in size with greater than 50% respiratory variability, suggesting right atrial pressure of 3 mmHg. IAS/Shunts: The atrial septum is grossly normal.  LEFT VENTRICLE PLAX 2D LVIDd:         4.10 cm LVIDs:         2.70 cm LV PW:         1.00 cm LV IVS:        0.80 cm LVOT diam:     1.90 cm LVOT Area:     2.84 cm  RIGHT VENTRICLE RV Basal diam:  2.60 cm LEFT ATRIUM             Index        RIGHT ATRIUM          Index LA diam:        3.40 cm 2.47 cm/m   RA Area:     8.37 cm LA Vol (A2C):   61.5 ml 44.71 ml/m  RA Volume:   15.40 ml 11.20 ml/m LA Vol (A4C):   61.0 ml 44.34 ml/m LA Biplane Vol: 62.0 ml 45.07 ml/m   AORTA Ao Root diam: 3.20 cm MITRAL VALVE MV Area (PHT): 3.99 cm     SHUNTS MV Decel Time: 190 msec     Systemic Diam: 1.90 cm MR Peak grad: 192.1 mmHg MR Vmax:      693.00 cm/s MV E velocity: 79.10 cm/s MV A velocity: 130.00 cm/s MV E/A ratio:  0.61 Buford Dresser MD Electronically signed by Buford Dresser MD Signature Date/Time: 05/27/2021/6:51:05 PM    Final     VAS US CAROTID   Result Date: 05/27/2021 Carotid Arterial Duplex Study Patient Name:  Cathren Laine  Date of Exam:   05/26/2021 Medical Rec #: [id number]        Accession #:    [id number] Date of Birth: 11/08/1960        Patient Gender: F Patient Age:   55 years Exam Location:  [facility] Procedure:      VAS US CAROTID Referring Phys: Alferd Patee [facility] --------------------------------------------------------------------------------  Indications:  CVA. Risk Factors: None. Limitations   Today's exam was limited due to the patient's inability or               unwillingness to cooperate. Performing  Technologist: Maudry Mayhew MHA, RDMS, RVT, RDCS  Examination Guidelines: A complete evaluation includes B-mode imaging, spectral Doppler, color Doppler, and power Doppler as needed of all accessible portions of each vessel. Bilateral testing is considered an integral part of a complete examination. Limited examinations for reoccurring  indications may be performed as noted.  Right Carotid Findings: +----------+--------+--------+--------+---------------------+------------------+             PSV cm/s EDV cm/s Stenosis Plaque Description    Comments            +----------+--------+--------+--------+---------------------+------------------+  CCA Prox   82       21                                                          +----------+--------+--------+--------+---------------------+------------------+  CCA Distal 52       16                smooth and                                                                       heterogenous                              +----------+--------+--------+--------+---------------------+------------------+  ICA Prox   51       21                heterogenous, smooth                                                             and calcific                              +----------+--------+--------+--------+---------------------+------------------+  ICA Distal 61       26                                                          +----------+--------+--------+--------+---------------------+------------------+  ECA        67       15                                      intimal thickening  +----------+--------+--------+--------+---------------------+------------------+ +----------+--------+-------+----------------+-------------------+             PSV cm/s EDV cms Describe         Arm Pressure (mmHG)  +----------+--------+-------+----------------+-------------------+  Subclavian 113      91      Multiphasic, WNL                       +----------+--------+-------+----------------+-------------------+ +---------+--------+--+--------+--+---------+  Vertebral PSV cm/s 39 EDV cm/s 11 Antegrade  +---------+--------+--+--------+--+---------+  Left Carotid Findings: +----------+--------+--------+--------+--------------------------+--------+             PSV cm/s EDV cm/s Stenosis Plaque  Description         Comments  +----------+--------+--------+--------+--------------------------+--------+  CCA Prox   66       24                                                     +----------+--------+--------+--------+--------------------------+--------+  CCA Distal 48       15                irregular and heterogenous           +----------+--------+--------+--------+--------------------------+--------+  ICA Prox   45       20                smooth and heterogenous              +----------+--------+--------+--------+--------------------------+--------+  ICA Distal 56       24                                                     +----------+--------+--------+--------+--------------------------+--------+  ECA        67       20                                                     +----------+--------+--------+--------+--------------------------+--------+ +----------+--------+--------+----------------+-------------------+             PSV cm/s EDV cm/s Describe         Arm Pressure (mmHG)  +----------+--------+--------+----------------+-------------------+  Subclavian 179               Multiphasic, WNL                      +----------+--------+--------+----------------+-------------------+ +---------+--------+--+--------+--+---------+  Vertebral PSV cm/s 37 EDV cm/s 14 Antegrade  +---------+--------+--+--------+--+---------+   Summary: Right Carotid: Velocities in the right ICA are consistent with a 1-39% stenosis. Left Carotid: Velocities in the left ICA are consistent with a 1-39% stenosis. Vertebrals:  Bilateral vertebral arteries demonstrate antegrade flow. Subclavians:  Normal flow hemodynamics were seen in bilateral subclavian              arteries. *See table(s) above for measurements and observations.  Electronically signed by Antony Contras MD on 05/27/2021 at 12:37:56 PM.    Final              Medical Problem List and Plan: 1.  Left-sided weakness functional deficits secondary to right thalamic/basal ganglia/frontal temporal ICH in the setting of hypertension as well as cocaine use             -patient may shower             -ELOS/Goals: 18-24 days, min assist with PT, OT, sup/min SLP             -left WFO, PRAFO's to be ordered 2.  Antithrombotics: -DVT/anticoagulation:  Mechanical: Antiembolism stockings, thigh (TED hose) Bilateral lower extremities             -antiplatelet therapy: N/A 3. Pain Management: Tylenol as needed 4. Mood: Provide emotional  support             -antipsychotic agents: N/A 5. Neuropsych: This patient is capable of making decisions on her own behalf. 6. Skin/Wound Care: Routine skin checks 7. Fluids/Electrolytes/Nutrition: Routine in and outs with follow-up chemistries 8.  Hypertension.  Lisinopril 2.5 mg daily.  Monitor with increased mobility 9.  Seizure prophylaxis.  Keppra 500 mg twice daily 10.  Hyperlipidemia.  Statin held given ICH. 11.  History of polysubstance abuse.  Provide counseling 12.  History of COPD/tobacco use.  Monitor oxygen saturations every shift. 13. Constipation:             -sorbitol on admit, SSE if needed       Cathlyn Parsons, PA-C 05/28/2021   I have personally performed a face to face diagnostic evaluation of this patient and formulated the key components of the plan.  Additionally, I have personally reviewed laboratory data, imaging studies, as well as relevant notes and concur with the physician assistant's documentation above.  The patient's status has not changed from the original H&P.  Any changes in documentation from the acute care chart have been noted above.  Meredith Staggers, MD, Mell[facility] Drown

## 2021-05-30 DIAGNOSIS — K5901 Slow transit constipation: Secondary | ICD-10-CM

## 2021-05-30 DIAGNOSIS — G441 Vascular headache, not elsewhere classified: Secondary | ICD-10-CM

## 2021-05-30 MED ORDER — TOPIRAMATE 25 MG PO TABS
25.0000 mg | ORAL_TABLET | Freq: Every day | ORAL | Status: DC
  Administered 2021-05-30 – 2021-05-31 (×2): 25 mg via ORAL
  Filled 2021-05-30 (×2): qty 1

## 2021-05-30 MED ORDER — FOLIC ACID 1 MG PO TABS
1.0000 mg | ORAL_TABLET | Freq: Every day | ORAL | Status: DC
  Administered 2021-05-30 – 2021-06-17 (×14): 1 mg via ORAL
  Filled 2021-05-30 (×16): qty 1

## 2021-05-30 NOTE — Progress Notes (Signed)
Inpatient Rehabilitation Admission Medication Review by a Pharmacist  A complete drug regimen review was completed for this patient to identify any potential clinically significant medication issues.  High Risk Drug Classes Is patient taking? Indication by Medication  Antipsychotic Yes Keppra - Seizure prophylaxis Topiramate - Headache   Anticoagulant No   Antibiotic No   Opioid No   Antiplatelet No   Hypoglycemics/insulin No   Vasoactive Medication Yes Lisinopril - HTN  Chemotherapy No   Other Yes Protonix - GERD  Nicotine patch - Smoking cessation Melatonin - Sleep Tylenol prn, Tramadol prn - Pain     Type of Medication Issue Identified Description of Issue Recommendation(s)  Drug Interaction(s) (clinically significant)     Duplicate Therapy     Allergy     No Medication Administration End Date     Incorrect Dose     Additional Drug Therapy Needed  Folic acid noted on discharge summary, but has not been resumed in CIR. Reached out to MD to clarify - Resumed folic acid.   Significant med changes from prior encounter (inform family/care partners about these prior to discharge).    Other       Clinically significant medication issues were identified that warrant physician communication and completion of prescribed/recommended actions by midnight of the next day:  No  Name of provider notified for urgent issues identified: Dr. Faith Rogue  Provider Method of Notification: Secure chat    Pharmacist comments: Reached out to MD to clarify - Resumed folic acid.   Time spent performing this drug regimen review (minutes):  15   Jerrilyn Cairo, PharmD PGY1 Pharmacy Resident Phone [phone] 05/30/2021 12:48 PM   Please check AMION for all [facility] Pharmacy phone numbers After 10:00 PM, call Main Pharmacy [phone]

## 2021-05-30 NOTE — Plan of Care (Signed)
°  Problem: Consults Goal: RH STROKE PATIENT EDUCATION Description: See Patient Education module for education specifics  Outcome: Progressing   Problem: RH BOWEL ELIMINATION Goal: RH STG MANAGE BOWEL WITH ASSISTANCE Description: STG Manage Bowel with  mod I Assistance. Outcome: Progressing Goal: RH STG MANAGE BOWEL W/MEDICATION W/ASSISTANCE Description: STG Manage Bowel with Medication with mod I Assistance. Outcome: Progressing   Problem: RH SKIN INTEGRITY Goal: RH STG SKIN FREE OF INFECTION/BREAKDOWN Description: Patient and son/DIL will be able to manage care at discharge using handouts and educational resources with min assist Outcome: Progressing Goal: RH STG MAINTAIN SKIN INTEGRITY WITH ASSISTANCE Description: STG Maintain Skin Integrity With min Assistance. Outcome: Progressing   Problem: RH SAFETY Goal: RH STG ADHERE TO SAFETY PRECAUTIONS W/ASSISTANCE/DEVICE Description: STG Adhere to Safety Precautions With cues /Assistance/Device. Outcome: Progressing   Problem: RH PAIN MANAGEMENT Goal: RH STG PAIN MANAGED AT OR BELOW PT'S PAIN GOAL Description: At or below level 4 with prn meds Outcome: Progressing   Problem: RH KNOWLEDGE DEFICIT Goal: RH STG INCREASE KNOWLEDGE OF HYPERTENSION Description: Patient and son/DIL will be able to manage HTN with medications and dietary modifications at discharge using handouts and educational resources independently Outcome: Progressing Goal: RH STG INCREASE KNOWLEGDE OF HYPERLIPIDEMIA Description: Patient and son/DIL will be able to manage HLD with medications and dietary modifications at discharge using handouts and educational resources independently Outcome: Progressing Goal: RH STG INCREASE KNOWLEDGE OF STROKE PROPHYLAXIS Description: Patient and son/DIL will be able to manage secondary stroke risks at discharge using handouts and educational resources independently Outcome: Progressing

## 2021-05-30 NOTE — Progress Notes (Signed)
Orthopedic Tech Progress Note Patient Details:  JREAM BROYLES 22-Oct-1960 [id number]  Order called into [facility] at 0915 for Lt PRAFO, Lt Resting WHO.   Patient ID: Erskine Squibb, female   DOB: [birth year], 59 y.o.   MRN: [id number]  Docia Furl 05/30/2021, 9:41 AM

## 2021-05-30 NOTE — Discharge Instructions (Addendum)
Inpatient Rehab Discharge Instructions  FRANCYNE ARREAGA Discharge date and time: No discharge date for patient encounter.   Activities/Precautions/ Functional Status: Activity: activity as tolerated Diet: regular diet Wound Care: Routine skin checks Functional status:  ___ No restrictions     ___ Walk up steps independently ___ 24/7 supervision/assistance   ___ Walk up steps with assistance ___ Intermittent supervision/assistance  ___ Bathe/dress independently ___ Walk with walker     __x_ Bathe/dress with assistance ___ Walk Independently    ___ Shower independently ___ Walk with assistance    ___ Shower with assistance ___ No alcohol     ___ Return to work/school ________  Special Instructions: No driving smoking alcohol or illicit drug use    COMMUNITY REFERRALS UPON DISCHARGE:    Home Health:   PT &  OT                  Agency:[facility] Phone: [phone]    Medical Equipment/Items Ordered: WHEELCHAIR, TUB BENCH AND Levan Hurst                                                 Agency/Supplier:[facility]   [phone]  MATCH IN FOR MEDICATION ASSISTANCE  GENERAL COMMUNITY RESOURCES FOR PATIENT/FAMILY: Support Groups:[facility]-[place] [employer]ET [facility]-November CALL [phone] TO REGISTER  My questions have been answered and I understand these instructions. I will adhere to these goals and the provided educational materials after my discharge from the hospital.  Patient/Caregiver Signature _______________________________ Date __________  Clinician Signature _______________________________________ Date __________  Please bring this form and your medication list with you to all your follow-up doctor's appointments.

## 2021-05-30 NOTE — Progress Notes (Signed)
PROGRESS NOTE   Subjective/Complaints: No issues overnight. Had multiple bm's overnight. C/o headache. Having some anxiety  ROS: Patient denies fever, rash, sore throat, blurred vision, nausea, vomiting, diarrhea, cough, shortness of breath or chest pain, joint or back pain   Objective:   No results found. Recent Labs    05/28/21 0319  WBC 5.1  HGB 12.6  HCT 39.4  PLT 249   Recent Labs    05/28/21 0319  NA 138  K 3.5  CL 105  CO2 27  GLUCOSE 97  BUN 10  CREATININE 0.47  CALCIUM 8.7*    Intake/Output Summary (Last 24 hours) at 05/30/2021 0745 Last data filed at 05/29/2021 1820 Gross per 24 hour  Intake 120 ml  Output --  Net 120 ml     Pressure Injury 05/25/21 Sacrum Medial;Lower Stage 1 -  Intact skin with non-blanchable redness of a localized area usually over a bony prominence. (Active)  05/25/21 2115  Location: Sacrum  Location Orientation: Medial;Lower  Staging: Stage 1 -  Intact skin with non-blanchable redness of a localized area usually over a bony prominence.  Wound Description (Comments):   Present on Admission: Yes    Physical Exam: Vital Signs Blood pressure (!) 149/109, pulse 79, temperature 97.8 F (36.6 C), temperature source Oral, resp. rate 18, height 5\' 1"  (1.549 m), weight 44.2 kg, SpO2 98 %.  General: Alert and oriented x 3, No apparent distress. frail HEENT: Head is normocephalic, atraumatic, PERRLA, EOMI, sclera anicteric, oral mucosa pink and moist, dentition intact, ext ear canals clear,  Neck: Supple without JVD or lymphadenopathy Heart: Reg rate and rhythm. No murmurs rubs or gallops Chest: CTA bilaterally without wheezes, rales, or rhonchi; no distress Abdomen: Soft, non-tender, non-distended, bowel sounds positive. Extremities: No clubbing, cyanosis, or edema. Pulses are 2+ Psych: Pt's affect is appropriate. Pt is cooperative Skin: Clean and intact without signs of  breakdown Neuro:  pt is alert and oriented to person, place. Left central 7, right gaze preference. Mild dysarthria. Poor concentration. LUE 1 to 1+/5. LLE 1+ to 2/5 HE, KE and 0/5 distally. Decreased LT left arm and leg. DTR's 3+ LLE and LUE with early extensor and flexor tone respectively Musculoskeletal: Full ROM, No pain with AROM or PROM in the neck, trunk, or extremities. Posture appropriate     Assessment/Plan: 1. Functional deficits which require 3+ hours per day of interdisciplinary therapy in a comprehensive inpatient rehab setting. Physiatrist is providing close team supervision and 24 hour management of active medical problems listed below. Physiatrist and rehab team continue to assess barriers to discharge/monitor patient progress toward functional and medical goals  Care Tool:  Bathing              Bathing assist       Upper Body Dressing/Undressing Upper body dressing   What is the patient wearing?: Hospital gown only    Upper body assist Assist Level: Dependent - Patient 0%    Lower Body Dressing/Undressing Lower body dressing      What is the patient wearing?: Incontinence brief, Pants     Lower body assist Assist for lower body dressing: Dependent - Patient 0%  Toileting Toileting    Toileting assist Assist for toileting: Moderate Assistance - Patient 50 - 74%     Transfers Chair/bed transfer  Transfers assist           Locomotion Ambulation   Ambulation assist              Walk 10 feet activity   Assist           Walk 50 feet activity   Assist           Walk 150 feet activity   Assist           Walk 10 feet on uneven surface  activity   Assist           Wheelchair     Assist               Wheelchair 50 feet with 2 turns activity    Assist            Wheelchair 150 feet activity     Assist          Blood pressure (!) 149/109, pulse 79, temperature 97.8 F (36.6  C), temperature source Oral, resp. rate 18, height 5\' 1"  (1.549 m), weight 44.2 kg, SpO2 98 %.  Medical Problem List and Plan: 1.  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  Antithrombotics: -DVT/anticoagulation:  Mechanical: Antiembolism stockings, thigh (TED hose) Bilateral lower extremities             -antiplatelet therapy: N/A 3. Pain Management: Tylenol as needed  -add scheduled topamax 25mg  qhs for headache 4. Mood: Provide emotional support             -antipsychotic agents: N/A 5. Neuropsych: This patient is capable of making decisions on her own behalf. 6. Skin/Wound Care: Routine skin checks 7. Fluids/Electrolytes/Nutrition: encourage fluids  -check labs 12/26 8.  Hypertension.  Lisinopril 2.5 mg daily.  Monitor with increased mobility 9.  Seizure prophylaxis.  Keppra 500 mg twice daily 10.  Hyperlipidemia.  Statin held given ICH. 11.  History of polysubstance abuse.  Provide counseling 12.  History of COPD/tobacco use.  Monitor oxygen saturations every shift. 13. Constipation:             -good results with sorbitol overnight    LOS: 1 days A FACE TO FACE EVALUATION WAS PERFORMED  05/30/2021, 7:45 AM

## 2021-05-31 LAB — COMPREHENSIVE METABOLIC PANEL
ALT: 72 U/L — ABNORMAL HIGH (ref 0–44)
AST: 40 U/L (ref 15–41)
Albumin: 3 g/dL — ABNORMAL LOW (ref 3.5–5.0)
Alkaline Phosphatase: 163 U/L — ABNORMAL HIGH (ref 38–126)
Anion gap: 9 (ref 5–15)
BUN: 15 mg/dL (ref 8–23)
CO2: 27 mmol/L (ref 22–32)
Calcium: 9.2 mg/dL (ref 8.9–10.3)
Chloride: 102 mmol/L (ref 98–111)
Creatinine, Ser: 0.57 mg/dL (ref 0.44–1.00)
GFR, Estimated: >60 mL/min (ref >60)
Glucose, Bld: 95 mg/dL (ref 70–99)
Potassium: 3.9 mmol/L (ref 3.5–5.1)
Sodium: 138 mmol/L (ref 135–145)
Total Bilirubin: 0.5 mg/dL (ref 0.3–1.2)
Total Protein: 5.6 g/dL — ABNORMAL LOW (ref 6.5–8.1)

## 2021-05-31 LAB — CBC WITH DIFFERENTIAL/PLATELET
Abs Immature Granulocytes: 0.01 10*3/uL (ref 0.00–0.07)
Basophils Absolute: 0 10*3/uL (ref 0.0–0.1)
Basophils Relative: 1 %
Eosinophils Absolute: 0.6 10*3/uL — ABNORMAL HIGH (ref 0.0–0.5)
Eosinophils Relative: 12 %
HCT: 42.6 % (ref 36.0–46.0)
Hemoglobin: 13.9 g/dL (ref 12.0–15.0)
Immature Granulocytes: 0 %
Lymphocytes Relative: 14 %
Lymphs Abs: 0.7 10*3/uL (ref 0.7–4.0)
MCH: 28.1 pg (ref 26.0–34.0)
MCHC: 32.6 g/dL (ref 30.0–36.0)
MCV: 86.2 fL (ref 80.0–100.0)
Monocytes Absolute: 0.5 10*3/uL (ref 0.1–1.0)
Monocytes Relative: 10 %
Neutro Abs: 3 10*3/uL (ref 1.7–7.7)
Neutrophils Relative %: 63 %
Platelets: 279 10*3/uL (ref 150–400)
RBC: 4.94 MIL/uL (ref 3.87–5.11)
RDW: 15.4 % (ref 11.5–15.5)
WBC: 4.8 10*3/uL (ref 4.0–10.5)
nRBC: 0 % (ref 0.0–0.2)

## 2021-05-31 NOTE — Progress Notes (Signed)
PMR Admission Coordinator Pre-Admission Assessment   Patient: Monica Miranda is an 60 y.o., female MRN: [id number] DOB: 1961-01-06 Height:   Weight:     Insurance Information HMO:    PPO:      PCP:      IPA:      80/20:      OTHER:  PRIMARY: Uninsured. Referred to [employer]      Policy#:       Subscriber:  CM Name:       Phone#:      Fax#:  Pre-Cert#:       Employer:  Benefits:  Phone #:      Name:  Eff. Date:      Deduct:       Out of Pocket Max:       Life Max:  CIR:       SNF:  Outpatient:      Co-Pay:  Home Health:       Co-Pay:  DME:      Co-Pay:  Providers:  SECONDARY:       Policy#:      Phone#:    Development worker, community:       Phone#:    The Actuary for patients in Inpatient Rehabilitation Facilities with attached Privacy Act [place] Records was provided and verbally reviewed with: N/A   Emergency Contact Information Contact Information       Name Relation Home Work Mobile    [name],[name] Daughter [phone]        [name] Daughter     [phone]           Current Medical History  Patient Admitting Diagnosis: CVA    History of Present Illness: Monica Miranda is a 60 year old right-handed female with history of hypertension, hyperlipidemia, COPD/tobacco use as well as polysubstance abuse.  Per chart review patient had been living  alone in [place] for the last 2 years and reportedly independent.  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  [place] was stable on multiple repeat CTs.  Her drug screen was positive for cocaine and amphetamines as well as a long history of alcohol use.  She was started on hypertonic saline monitor closely in the ICU required nicardipine drip.  She was discharged on salt tablets 2 g every 8 hours with sodium greater than 140 as  well as started on Keppra 500 twice daily for seizure prophylaxis.  She was discharged planning to live with her son and daughter in law in [place] and on the drive from [place] noted increased left-sided weakness as well as altered mental status while driving through [place].  She did not seek attention for that and made it home and  presented to [facility] 05/26/2021.  A CT of the head as well as cervical spine showed large intraparenchymal hemorrhage involving posterior right frontal lobe moderate amount of surrounding edema and associated mass-effect on the right lateral ventricle and proximally 11 mm right to left midline shift.  No acute traumatic cervical spine pathology.  MRA/MRI large intraparenchymal hemorrhages right frontotemporal region estimated volume 54 mL.  Medial extension to involve right basal ganglia thalamus right cerebral peduncle and midbrain.  No visible underlying lesion or structural abnormality.  Associated regional mass-effect 5 mm right to left shift as well as associated small volume subarachnoid hemorrhage within the adjacent posterior right parietal temporal  region.  Echocardiogram with ejection fraction of 60 to 65% no wall motion abnormality.  Admission chemistries were unremarkable except alkaline phosphatase 138, troponin negative, urine drug screen negative.  She remains on Keppra for seizure prophylaxis.  Tolerating a regular diet.  Therapy evaluations completed due to patient's left-sided weakness decreased functional mobility was admitted for a comprehensive rehab program.   Complete NIHSS TOTAL: 10   [facility]'s medical record from [facility] has been reviewed by the rehabilitation admission coordinator and physician.   Past Medical History      Past Medical History:  Diagnosis Date   COPD (chronic obstructive pulmonary disease) ([place])     Rib fracture      4 months ago   Stroke [facility])        Has the patient had major surgery during 100 days prior to admission?  No   Family History   family history is not on file.   Current Medications   Current Facility-Administered Medications:     stroke: mapping our early stages of recovery book, , Does not apply, Once, Donnetta Simpers, MD   acetaminophen (TYLENOL) tablet 650 mg, 650 mg, Oral, Q4H PRN, 650 mg at 05/27/21 1056 **OR** acetaminophen (TYLENOL) 160 MG/5ML solution 650 mg, 650 mg, Per Tube, Q4H PRN **OR** acetaminophen (TYLENOL) suppository 650 mg, 650 mg, Rectal, Q4H PRN, Donnetta Simpers, MD   Chlorhexidine Gluconate Cloth 2 % PADS 6 each, 6 each, Topical, Q0600, Donnetta Simpers, MD, 6 each at 05/27/21 1022   levETIRAcetam (KEPPRA) tablet 500 mg, 500 mg, Oral, BID, Gherghe, Costin M, MD   lisinopril (ZESTRIL) tablet 2.5 mg, 2.5 mg, Oral, Daily, Biby, Sharon L, NP, 2.5 mg at 05/27/21 0908   melatonin tablet 3 mg, 3 mg, Oral, QHS, Khaliqdina, Alferd Patee, MD, 3 mg at 05/26/21 2313   ondansetron (ZOFRAN) injection 4 mg, 4 mg, Intravenous, Q6H PRN, Donnetta Simpers, MD   pantoprazole (PROTONIX) EC tablet 40 mg, 40 mg, Oral, QHS, Gherghe, Vella Redhead, MD   senna-docusate (Senokot-S) tablet 1 tablet, 1 tablet, Oral, BID, Donnetta Simpers, MD, 1 tablet at 05/26/21 2313   Patients Current Diet:  Diet Order                  Diet Heart Room service appropriate? Yes; Fluid consistency: Thin  Diet effective now                         Precautions / Restrictions Precautions Precautions: Fall Precaution Comments: BP <150 Restrictions Weight Bearing Restrictions: No    Has the patient had 2 or more falls or a fall with injury in the past year? Unknown   Prior Activity Level Community (5-7x/wk): prior to CVA in early Dec was independent without device or limitations, was in [place] for a "job opportunity", but after her CVA was denied AIR 2/2 positive tox screen, has been essentially total care since early december   Prior Functional Level Self Care: Did the patient need help bathing, dressing,  using the toilet or eating? Independent prior to December CVA, assist since December   Indoor Mobility: Did the patient need assistance with walking from room to room (with or without device)? Independent prior to December CVA, assist since December   Stairs: Did the patient need assistance with internal or external stairs (with or without device)? Independent prior to December CVA, assist since December   Functional Cognition: Did the patient need help planning regular tasks such as shopping or remembering to  take medications? Independent prior to December CVA, assist since December   Patient Information Are you of Hispanic, Latino/a,or Spanish origin?: A. No, not of Hispanic, Latino/a, or Spanish origin What is your race?: A. White Do you need or want an interpreter to communicate with a doctor or health care staff?: 0. No   Patient's Response To:  Health Literacy and Transportation Is the patient able to respond to health literacy and transportation needs?: Yes Health Literacy - How often do you need to have someone help you when you read instructions, pamphlets, or other written material from your doctor or pharmacy?: Sometimes In the past 12 months, has lack of transportation kept you from medical appointments or from getting medications?: Yes In the past 12 months, has lack of transportation kept you from meetings, work, or from getting things needed for daily living?: Yes   Home Assistive Devices / Equipment Home Equipment: None   Prior Device Use: Indicate devices/aids used by the patient prior to current illness, exacerbation or injury? Manual wheelchair   Current Functional Level Cognition   Overall Cognitive Status: Impaired/Different from baseline Current Attention Level: Sustained Orientation Level: Oriented X4 Following Commands: Follows one step commands with increased time Safety/Judgement: Decreased awareness of safety General Comments: states that she is in [place]  and it's January 2012. Following commands with increased time but easily distracted by being cold. cues required for redirection.    Extremity Assessment (includes Sensation/Coordination)   Upper Extremity Assessment: LUE deficits/detail LUE Deficits / Details: no AROM noted to L UE.  Increased flexor tone noted to L UE LUE: Shoulder pain with ROM LUE Sensation: decreased light touch LUE Coordination: decreased fine motor, decreased gross motor  Lower Extremity Assessment: Defer to PT evaluation LLE Deficits / Details: grossly 2-/5 LLE Sensation: decreased proprioception LLE Coordination: decreased fine motor, decreased gross motor     ADLs   Overall ADL's : Needs assistance/impaired Eating/Feeding: Minimal assistance, Cueing for compensatory techinques Grooming: Wash/dry hands, Wash/dry face, Minimal assistance, Cueing for compensatory techniques, Sitting Upper Body Bathing: Moderate assistance, Bed level Lower Body Bathing: Total assistance, Bed level Upper Body Dressing : Moderate assistance, Sitting Lower Body Dressing: Total assistance, Bed level Toilet Transfer: Moderate assistance, +2 for physical assistance     Mobility   Overal bed mobility: Needs Assistance Bed Mobility: Supine to Sit Supine to sit: Mod assist General bed mobility comments: ModA bringing L LE off bed and trunk elevation     Transfers   Overall transfer level: Needs assistance Equipment used: 2 person hand held assist Transfers: Sit to/from Stand, Bed to chair/wheelchair/BSC Sit to Stand: Mod assist, +2 physical assistance Bed to/from chair/wheelchair/BSC transfer type:: Step pivot Step pivot transfers: Mod assist, +2 physical assistance General transfer comment: modA+2 for sit to stand from EOB. Blocking L knee and cues required for sequencing. Patient able to take steps towards chair but modA+2 to maintain standing balance and cues for sequencing     Ambulation / Gait / Stairs / Wheelchair Mobility          Posture / Balance Balance Overall balance assessment: Needs assistance Sitting-balance support: Single extremity supported, Feet supported Sitting balance-Leahy Scale: Fair Standing balance support: Bilateral upper extremity supported Standing balance-Leahy Scale: Poor Standing balance comment: reliant on UE support and external assist     Special needs/care consideration N/a    Previous Home Environment (from acute therapy documentation) Living Arrangements: Children Available Help at Discharge: Family, Available 24 hours/day Type of Home: House Home Layout:  One level Home Access: Stairs to enter Entrance Stairs-Rails: Right, Left Entrance Stairs-Number of Steps: 2 Bathroom Shower/Tub: Chiropodist: Standard   Discharge Living Setting Plans for Discharge Living Setting: Lives with (comment) (son and daughter in law) Type of Home at Discharge: House Discharge Home Layout: One level Discharge Home Access: Stairs to enter Entrance Stairs-Rails: None Entrance Stairs-Number of Steps: 2 Discharge Bathroom Shower/Tub: Tub/shower unit Discharge Bathroom Toilet: Standard Discharge Bathroom Accessibility: Yes How Accessible: Accessible via walker Does the patient have any problems obtaining your medications?: Yes (Describe) (uninsured)   Social/Family/Support Systems Anticipated Caregiver: daughter in Production assistant, radio (primary) and daughter Jonelle Sidle), and sister Hospital doctor) Anticipated Caregiver's Contact Information: Leafy Ro [phone]; [name] [phone] Ability/Limitations of Caregiver: has been providing significant assist this whole month, ideally would provide min assist for mobility and ADLs Caregiver Availability: 24/7 Discharge Plan Discussed with Primary Caregiver: Yes Is Caregiver In Agreement with Plan?: Yes Does Caregiver/Family have Issues with Lodging/Transportation while Pt is in Rehab?: No   Goals Patient/Family Goal for Rehab: PT/OT min assist,  SLP supervision to min assist Expected length of stay: 14-18 days Additional Information: recent CVA in December while she was in [place]; she was denied rehab admission at that time so moved home with her family in [place]; they have been providing care for her since that time. Pt/Family Agrees to Admission and willing to participate: Yes Program Orientation Provided & Reviewed with Pt/Caregiver Including Roles  & Responsibilities: Yes  Barriers to Discharge: Insurance for SNF coverage   Decrease burden of Care through IP rehab admission: n/a   Possible need for SNF placement upon discharge: not anticipated   Patient Condition: I have reviewed medical records from [facility], spoken with CM, and patient and family member. I met with patient at the bedside for inpatient rehabilitation assessment.  Patient will benefit from ongoing PT, OT, and SLP, can actively participate in 3 hours of therapy a day 5 days of the week, and can make measurable gains during the admission.  Patient will also benefit from the coordinated team approach during an Inpatient Acute Rehabilitation admission.  The patient will receive intensive therapy as well as Rehabilitation physician, nursing, social worker, and care management interventions.  Due to bladder management, bowel management, safety, skin/wound care, disease management, medication administration, pain management, and patient education the patient requires 24 hour a day rehabilitation nursing.  The patient is currently mod to max assist with mobility and basic ADLs.  Discharge setting and therapy post discharge at  home  is anticipated.  Patient has agreed to participate in the Acute Inpatient Rehabilitation Program and will admit today.   Preadmission Screen Completed By:  Michel Santee, PT, DPT 05/27/2021 4:03 PM ______________________________________________________________________   Discussed status with Dr. Naaman Plummer on 05/28/21  at 12:06 PM  and received approval for  admission today.   Admission Coordinator:  Michel Santee, PT, DPT time 12:06 PM Sudie Grumbling 05/28/21     Assessment/Plan: Diagnosis: CVA Does the need for close, 24 hr/day Medical supervision in concert with the patient's rehab needs make it unreasonable for this patient to be served in a less intensive setting? Yes Co-Morbidities requiring supervision/potential complications: polysubstance abuse, obstipation,  Due to bladder management, bowel management, safety, skin/wound care, disease management, medication administration, pain management, and patient education, does the patient require 24 hr/day rehab nursing? Yes Does the patient require coordinated care of a physician, rehab nurse, PT, OT, and SLP to address physical and functional deficits in the context  of the above medical diagnosis(es)? Yes Addressing deficits in the following areas: balance, endurance, locomotion, strength, transferring, bowel/bladder control, bathing, dressing, feeding, grooming, toileting, cognition, and psychosocial support Can the patient actively participate in an intensive therapy program of at least 3 hrs of therapy 5 days a week? Yes The potential for patient to make measurable gains while on inpatient rehab is excellent Anticipated functional outcomes upon discharge from inpatient rehab: min assist PT, min assist OT, supervision and min assist SLP Estimated rehab length of stay to reach the above functional goals is: 18-24 days Anticipated discharge destination: Home 10. Overall Rehab/Functional Prognosis: excellent     MD Signature: Meredith Staggers, MD, [place] Director Rehabilitation Services 05/29/2021

## 2021-05-31 NOTE — Progress Notes (Signed)
PROGRESS NOTE   Subjective/Complaints: No new complaints this morning [place]  ROS: Patient denies fever, rash, sore throat, blurred vision, nausea, vomiting, diarrhea, cough, shortness of breath or chest pain, joint or back pain   Objective:   No results found. Recent Labs    05/31/21 0720  WBC 4.8  HGB 13.9  HCT 42.6  PLT 279   Recent Labs    05/31/21 0720  NA 138  K 3.9  CL 102  CO2 27  GLUCOSE 95  BUN 15  CREATININE 0.57  CALCIUM 9.2    Intake/Output Summary (Last 24 hours) at 05/31/2021 1420 Last data filed at 05/31/2021 0900 Gross per 24 hour  Intake 420 ml  Output --  Net 420 ml     Pressure Injury 05/25/21 Sacrum Medial;Lower Stage 1 -  Intact skin with non-blanchable redness of a localized area usually over a bony prominence. (Active)  05/25/21 2115  Location: Sacrum  Location Orientation: Medial;Lower  Staging: Stage 1 -  Intact skin with non-blanchable redness of a localized area usually over a bony prominence.  Wound Description (Comments):   Present on Admission: Yes    Physical Exam: Vital Signs Blood pressure 100/68, pulse 85, temperature 98.3 F (36.8 C), temperature source Oral, resp. rate 17, height 5\' 1"  (1.549 m), weight 44.2 kg, SpO2 95 %. Gen: no distress, normal appearing HEENT: oral mucosa pink and moist, NCAT Cardio: Reg rate Chest: normal effort, normal rate of breathing Abd: soft, non-distended Ext: no edema Psych: Pt's affect is appropriate. Pt is cooperative Skin: Clean and intact without signs of breakdown Neuro:  pt is alert and oriented to person, place. Left central 7, right gaze preference. Mild dysarthria. Poor concentration. LUE 1 to 1+/5. LLE 1+ to 2/5 HE, KE and 0/5 distally. Decreased LT left arm and leg. DTR's 3+ LLE and LUE with early extensor and flexor tone respectively Musculoskeletal: Full ROM, No pain with AROM or PROM in the neck, trunk, or  extremities. Posture appropriate     Assessment/Plan: 1. Functional deficits which require 3+ hours per day of interdisciplinary therapy in a comprehensive inpatient rehab setting. Physiatrist is providing close team supervision and 24 hour management of active medical problems listed below. Physiatrist and rehab team continue to assess barriers to discharge/monitor patient progress toward functional and medical goals  Care Tool:  Bathing  Bathing activity did not occur: Safety/medical concerns (no time/declined)           Bathing assist       Upper Body Dressing/Undressing Upper body dressing   What is the patient wearing?: Pull over shirt    Upper body assist Assist Level: Moderate Assistance - Patient 50 - 74%    Lower Body Dressing/Undressing Lower body dressing      What is the patient wearing?: Pants     Lower body assist Assist for lower body dressing: Moderate Assistance - Patient 50 - 74%     Toileting Toileting Toileting Activity did not occur (Clothing management and hygiene only): N/A (no void or bm)  Toileting assist Assist for toileting: Moderate Assistance - Patient 50 - 74%     Transfers Chair/bed transfer  Transfers assist  Chair/bed transfer assist level: Moderate Assistance - Patient 50 - 74%     Locomotion Ambulation   Ambulation assist      Assist level: Moderate Assistance - Patient 50 - 74% Assistive device: No Device Max distance: 20   Walk 10 feet activity   Assist     Assist level: Moderate Assistance - Patient - 50 - 74% Assistive device: Hand held assist   Walk 50 feet activity   Assist Walk 50 feet with 2 turns activity did not occur: Safety/medical concerns         Walk 150 feet activity   Assist Walk 150 feet activity did not occur: Safety/medical concerns         Walk 10 feet on uneven surface  activity   Assist Walk 10 feet on uneven surfaces activity did not occur: Safety/medical  concerns         Wheelchair     Assist Is the patient using a wheelchair?: Yes Type of Wheelchair: Manual    Wheelchair assist level: Dependent - Patient 0% Max wheelchair distance: 150    Wheelchair 50 feet with 2 turns activity    Assist        Assist Level: Dependent - Patient 0%   Wheelchair 150 feet activity     Assist      Assist Level: Dependent - Patient 0%   Blood pressure 100/68, pulse 85, temperature 98.3 F (36.8 C), temperature source Oral, resp. rate 17, height 5\' 1"  (1.549 m), weight 44.2 kg, SpO2 95 %.  Medical Problem List and Plan: 1.  Left-sided weakness functional deficits secondary to right thalamic/basal ganglia/frontal temporal ICH in the setting of hypertension as well as cocaine use             -patient may shower             -ELOS/Goals: 18-24 days, min assist with PT, OT, sup/min SLP             -left WFO, PRAFO's ordered  Continue CIR 2.  Impaired mobility: continue Antiembolism stockings, thigh (TED hose) Bilateral lower extremities             -antiplatelet therapy: N/A 3.Headache: continue scheduled topamax 25mg  qhs for headache 4. Mood: Provide emotional support             -antipsychotic agents: N/A 5. Neuropsych: This patient is capable of making decisions on her own behalf. 6. Skin/Wound Care: Routine skin checks 7. Fluids/Electrolytes/Nutrition: encourage fluids  -check labs 12/26 8.  Hypertension.  d/c lisinopril.  Monitor with increased mobility 9.  Seizure prophylaxis.  Keppra 500 mg twice daily 10.  Hyperlipidemia.  Statin held given ICH. 11.  History of polysubstance abuse.  Provide counseling 12.  History of COPD/tobacco use.  Monitor oxygen saturations every shift. 13. Constipation:             -good results with sorbitol overnight    LOS: 2 days A FACE TO FACE EVALUATION WAS PERFORMED  P Monica Miranda 05/31/2021, 2:20 PM

## 2021-05-31 NOTE — Evaluation (Signed)
Physical Therapy Assessment and Plan  Patient Details  Name: Monica Miranda MRN: [id number] Date of Birth: 1960-12-14  PT Diagnosis: Abnormal posture, Abnormality of gait, Cognitive deficits, Hemiparesis non-dominant, and Muscle weakness Rehab Potential: Good ELOS: 2 weeks   Today's Date: 05/31/2021 PT Individual Time: 1000-1100 PT Individual Time Calculation (min): 60 min    Hospital Problem: Principal Problem:   ICH (intracerebral hemorrhage) ([place])   Past Medical History:  Past Medical History:  Diagnosis Date   COPD (chronic obstructive pulmonary disease) ([place])    Rib fracture    4 months ago   Stroke [facility])    Past Surgical History:  Past Surgical History:  Procedure Laterality Date   ESOPHAGEAL DILATION      Assessment & Plan Clinical Impression: Patient is a 60 y.o. year old female with history of hypertension, hyperlipidemia, COPD/tobacco use as well as polysubstance abuse.  Per chart review patient had been living  alone in [place] for the last 2 years and reportedly independent.  Admission at [facility] in [facility] 12/2 to 05/13/2021 for large right thalamic/basal ganglia/frontotemporal intraparenchymal hemorrhage with an ICH score of 3 initially at outside hospital.  Vaughn was stable on multiple repeat CTs.  Her drug screen was positive for cocaine and amphetamines as well as a long history of alcohol use.  She was started on hypertonic saline monitor closely in the ICU required nicardipine drip.  She was discharged on salt tablets 2 g every 8 hours with sodium greater than 140 as well as started on Keppra 500 twice daily for seizure prophylaxis.  She was discharged planning to live with her son and daughter in law in [place] and on the drive from [place] noted increased left-sided weakness as well as altered mental status while driving through [place].  She did not seek attention for that and made it home and  presented to [facility] 05/26/2021.  A CT of the  head as well as cervical spine showed large intraparenchymal hemorrhage involving posterior right frontal lobe moderate amount of surrounding edema and associated mass-effect on the right lateral ventricle and proximally 11 mm right to left midline shift.  No acute traumatic cervical spine pathology.  MRA/MRI large intraparenchymal hemorrhages right frontotemporal region estimated volume 54 mL.  Medial extension to involve right basal ganglia thalamus right cerebral peduncle and midbrain.  No visible underlying lesion or structural abnormality.  Associated regional mass-effect 5 mm right to left shift as well as associated small volume subarachnoid hemorrhage within the adjacent posterior right parietal temporal region.  Echocardiogram with ejection fraction of 60 to 65% no wall motion abnormality.  Admission chemistries were unremarkable except alkaline phosphatase 138, troponin negative, urine drug screen negative.  She remains on Keppra for seizure prophylaxis.  Tolerating a regular diet.  Therapy evaluations completed due to patient's left-sided weakness decreased functional mobility was admitted for a comprehensive rehab program.  Patient currently requires mod with mobility secondary to muscle weakness, decreased cardiorespiratoy endurance, decreased visual perceptual skills and field cut, decreased midline orientation, decreased attention to left, left side neglect, and decreased motor planning, decreased initiation, decreased attention, decreased awareness, decreased problem solving, decreased safety awareness, decreased memory, and delayed processing, and decreased sitting balance, decreased standing balance, decreased postural control, hemiplegia, and decreased balance strategies.  Prior to hospitalization, patient was independent  with mobility and lived with Son in a House home.  Home access is 2Stairs to enter.  Patient will benefit from skilled PT intervention to maximize safe  functional mobility,  minimize fall risk, and decrease caregiver burden for planned discharge home with 24 hour assist.  Anticipate patient will benefit from follow up [facility] at discharge.  Patient received asleep in recliner, sliding out, difficult to wake. Agreeable to PT eval. She reports 4/10 pain, HA, premedicated. PT providing rest breaks, distractions and repositioning to assist with pain management. Max verbal cues, but MinA to reposition in chair. MOdA stand pivot to wc with L LE blocked. Patient able to move L LE vs gravity, but demonstrated poor power modulation and slight dysmetria during heel/shin assessment. Patient very perseverative on pain medications, B12 vitamins and her sheets being changed throughout session- PT with difficulty redirecting patient to therapeutic task at times. PT alerting RN to patients requests for pain rx, vitamins and her sheets being changed. Slight hypertonia in L LE, extensor tone noted during gait with L ankle inversion in stance. Patient remaining up in recliner, legs elevated, tilted back, seatbelt alarm on, call light within reach.   PT - End of Session Activity Tolerance: Tolerates 10 - 20 min activity with multiple rests Endurance Deficit: Yes Endurance Deficit Description: generalized weakness PT Assessment Rehab Potential (ACUTE/IP ONLY): Good PT Barriers to Discharge: Decreased caregiver support;Home environment access/layout;Inaccessible home environment;Lack of/limited family support;Behavior PT Patient demonstrates impairments in the following area(s): Balance;Behavior;Endurance;Motor;Nutrition;Pain;Perception;Safety;Sensory;Skin Integrity PT Transfers Functional Problem(s): Bed Mobility;Bed to Chair;Car;Furniture PT Locomotion Functional Problem(s): Ambulation;Wheelchair Mobility;Stairs PT Plan PT Intensity: Minimum of 1-2 x/day ,45 to 90 minutes PT Frequency: 5 out of 7 days PT Duration Estimated Length of Stay: 2 weeks PT Treatment/Interventions: Ambulation/gait  training;Discharge planning;Functional mobility training;Psychosocial support;Therapeutic Activities;Balance/vestibular training;Visual/perceptual remediation/compensation;Disease management/prevention;Neuromuscular re-education;Skin care/wound management;Therapeutic Exercise;Wheelchair propulsion/positioning;Cognitive remediation/compensation;DME/adaptive equipment instruction;Pain management;Splinting/orthotics;UE/LE Strength taining/ROM;Community reintegration;Functional electrical stimulation;Patient/family education;Stair training;UE/LE Coordination activities PT Transfers Anticipated Outcome(s): spv PT Locomotion Anticipated Outcome(s): CGA PT Recommendation Recommendations for Other Services: Neuropsych consult;Therapeutic Recreation consult Therapeutic Recreation Interventions: Stress management Follow Up Recommendations: Home health PT;24 hour supervision/assistance Patient destination: Home Equipment Recommended: To be determined   PT Evaluation Precautions/Restrictions Precautions Precautions: Fall Precaution Comments: BP <150, L hemi, L inattention Restrictions Weight Bearing Restrictions: No General   Vital Signs  Pain Pain Assessment Pain Scale: 0-10 Pain Score: 4  Pain Type: Acute pain Pain Location: Head Pain Orientation: Anterior Pain Descriptors / Indicators: Headache Pain Onset: On-going Pain Intervention(s): Relaxation;Emotional support Pain Interference Pain Interference Pain Effect on Sleep: 1. Rarely or not at all Pain Interference with Therapy Activities: 1. Rarely or not at all Pain Interference with Day-to-Day Activities: 2. Occasionally Home Living/Prior Functioning Home Living Available Help at Discharge: Family;Available 24 hours/day Type of Home: House Home Access: Stairs to enter [employer] of Steps: 2 Entrance Stairs-Rails: Right;Left Home Layout: One level Bathroom Shower/Tub: Chiropodist:  Standard Additional Comments: Information pulled in from chart, pt unsure of son's home set up  Lives With: Son Prior Function Level of Independence: Independent with basic ADLs;Independent with transfers;Independent with gait;Independent with homemaking with ambulation  Able to Take Stairs?: Yes Driving: Yes Vocation: Full time employment Vocation Requirements: 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, occupational - History Ability to See in Adequate Light: 1 Impaired Vision - Assessment Eye Alignment: Within Functional Limits Ocular Range of Motion: Within Functional Limits Alignment/Gaze Preference: Within Defined Limits Tracking/Visual Pursuits: Decreased smoothness of horizontal tracking;Requires cues, head turns, or add eye shifts to track;Unable to hold eye position out of midline;Decreased smoothness of vertical tracking Saccades: Additional head turns occurred during testing;Additional eye shifts occurred during testing  Additional Comments: Decreased attention to the L, but can scan with OT in the L visual field Perception Perception: Impaired Inattention/Neglect: Does not attend to left visual field;Does not attend to left side of body Praxis Praxis: Impaired Praxis Impairment Details: Initiation;Motor planning  Cognition Overall Cognitive Status: Impaired/Different from baseline Arousal/Alertness: Awake/alert Orientation Level: Oriented to person Year: 2023 Month: December Day of Week: Incorrect Attention: Sustained Focused Attention: Appears intact Sustained Attention: Impaired Sustained Attention Impairment: Verbal basic;Functional basic Memory: Impaired Memory Impairment: Decreased recall of new information;Retrieval deficit Immediate Memory Recall: Sock;Blue;Bed Memory Recall Sock: Without Cue Memory Recall Blue: Without Cue Memory Recall Bed: Without Cue Awareness: Impaired Awareness Impairment: Emergent impairment Problem Solving:  Impaired Sequencing: Impaired Organizing: Impaired Behaviors: Perseveration;Confabulation Safety/Judgment: Impaired Sensation Sensation Light Touch: Impaired Detail Light Touch Impaired Details: Impaired LUE;Impaired LLE Hot/Cold: Not tested Proprioception: Impaired Detail Proprioception Impaired Details: Impaired LLE;Impaired LUE Coordination Gross Motor Movements are Fluid and Coordinated: No Fine Motor Movements are Fluid and Coordinated: No Coordination and Movement Description: Moderate L hemi Heel Shin Test: equal ROM B, but slightly dysmetric L LE Motor  Motor Motor: Hemiplegia Motor - Skilled Clinical Observations: L hemi   Trunk/Postural Assessment  Cervical Assessment Cervical Assessment: Within Functional Limits Thoracic Assessment Thoracic Assessment: Within Functional Limits Lumbar Assessment Lumbar Assessment: Exceptions to [facility] (posterior pelvic tilt) Postural Control Postural Control: Deficits on evaluation Righting Reactions: delayed and inadequate  Balance Balance Balance Assessed: Yes Standardized Balance Assessment Standardized Balance Assessment: Berg Balance Test Berg Balance Test Sit to Stand: Needs minimal aid to stand or to stabilize Standing Unsupported: Unable to stand 30 seconds unassisted Sitting with Back Unsupported but Feet Supported on Floor or Stool: Able to sit 30 seconds Stand to Sit: Needs assistance to sit Transfers: Needs one person to assist Standing Unsupported with Eyes Closed: Needs help to keep from falling Standing Ubsupported with Feet Together: Needs help to attain position and unable to hold for 15 seconds From Standing, Reach Forward with Outstretched Arm: Loses balance while trying/requires external support From Standing Position, Pick up Object from Floor: Unable to try/needs assist to keep balance From Standing Position, Turn to Look Behind Over each Shoulder: Needs assist to keep from losing balance and falling Turn  360 Degrees: Needs assistance while turning Standing Unsupported, Alternately Place Feet on Step/Stool: Needs assistance to keep from falling or unable to try Standing Unsupported, One Foot in Front: Loses balance while stepping or standing Standing on One Leg: Unable to try or needs assist to prevent fall Total Score: 4 Static Sitting Balance Static Sitting - Balance Support: Feet unsupported Static Sitting - Level of Assistance: 5: Stand by assistance Dynamic Sitting Balance Dynamic Sitting - Balance Support: Feet unsupported Dynamic Sitting - Level of Assistance: 5: Stand by assistance Static Standing Balance Static Standing - Balance Support: During functional activity Static Standing - Level of Assistance: 4: Min assist Dynamic Standing Balance Dynamic Standing - Balance Support: During functional activity Dynamic Standing - Level of Assistance: 3: Mod assist Extremity Assessment  RUE Assessment RUE Assessment: Within Functional Limits LUE Assessment LUE Assessment: Exceptions to [facility] LUE Body System: Neuro Brunstrum levels for arm and hand: Arm;Hand Brunstrum level for arm: Stage II Synergy is developing Brunstrum level for hand: Stage IV Movements deviating from synergies LUE AROM (degrees) Left Shoulder Flexion: 35 Degrees Left Shoulder ABduction: 30 Degrees Left Composite Finger Extension: 75% Left Composite Finger Flexion: 75% RLE Assessment RLE Assessment: Within Functional Limits LLE Assessment LLE Assessment: Exceptions to [place]  Strength Comments: unable to follow cues correctly for accurate assessment; can move vs gravity LLE Tone LLE Tone: Mild;Modified Ashworth;Hypertonic Hypertonic Details: slight extensor tone Body Part - Modified Ashworth Scale: Gastrocnemius;Quadriceps Quadriceps - Modified Ashworth Scale for Grading Hypertonia LLE: Slight increase in muscle tone, manifested by a catch, followed by minimal resistance throughout the remainder (less than  half) of the ROM Gastrocnemius - Modified Ashworth Scale for Grading Hypertonia LLE: Slight increase in muscle tone, manifested by a catch, followed by minimal resistance throughout the remainder (less than half) of the ROM  Care Tool Care Tool Bed Mobility Roll left and right activity   Roll left and right assist level: Contact Guard/Touching assist    Sit to lying activity   Sit to lying assist level: Contact Guard/Touching assist    Lying to sitting on side of bed activity   Lying to sitting on side of bed assist level: the ability to move from lying on the back to sitting on the side of the bed with no back support.: Contact Guard/Touching assist     Care Tool Transfers Sit to stand transfer   Sit to stand assist level: Moderate Assistance - Patient 50 - 74%    Chair/bed transfer   Chair/bed transfer assist level: Moderate Assistance - Patient 50 - 74%     Toilet transfer Toilet transfer activity did not occur: Safety/medical concerns      Scientist, product/process development transfer activity did not occur: Safety/medical concerns        Care Tool Locomotion Ambulation   Assist level: Moderate Assistance - Patient 50 - 74% Assistive device: No Device Max distance: 20  Walk 10 feet activity   Assist level: Moderate Assistance - Patient - 50 - 74% Assistive device: Hand held assist   Walk 50 feet with 2 turns activity Walk 50 feet with 2 turns activity did not occur: Safety/medical concerns      Walk 150 feet activity Walk 150 feet activity did not occur: Safety/medical concerns      Walk 10 feet on uneven surfaces activity Walk 10 feet on uneven surfaces activity did not occur: Safety/medical concerns      Stairs Stair activity did not occur: Safety/medical concerns        Walk up/down 1 step activity Walk up/down 1 step or curb (drop down) activity did not occur: Safety/medical concerns      Walk up/down 4 steps activity Walk up/down 4 steps activity did not occur:  Safety/medical concerns      Walk up/down 12 steps activity Walk up/down 12 steps activity did not occur: Safety/medical concerns      Pick up small objects from floor Pick up small object from the floor (from standing position) activity did not occur: Safety/medical concerns      Wheelchair Is the patient using a wheelchair?: Yes Type of Wheelchair: Manual   Wheelchair assist level: Dependent - Patient 0% Max wheelchair distance: 150  Wheel 50 feet with 2 turns activity   Assist Level: Dependent - Patient 0%  Wheel 150 feet activity   Assist Level: Dependent - Patient 0%    Refer to Care Plan for Long Term Goals  SHORT TERM GOAL WEEK 1 PT Short Term Goal 1 (Week 1): Patient will complete bed mobility with supervision PT Short Term Goal 2 (Week 1): Patient will complete sit <> stand with MinA consistently PT Short Term Goal 3 (Week 1): Patient will ambulate >20 ft with LRAD and MinA PT Short Term Goal  4 (Week 1): Patient will improve BBS by >/= 7pts  Recommendations for other services: Neuropsych and Therapeutic Recreation  Stress management  Skilled Therapeutic Intervention Mobility Bed Mobility Bed Mobility: Sit to Supine;Supine to Sit Supine to Sit: Contact Guard/Touching assist Sit to Supine: Contact Guard/Touching assist Transfers Transfers: Sit to Stand;Stand to Sit;Stand Pivot Transfers Sit to Stand: Minimal Assistance - Patient > 75% Stand to Sit: Minimal Assistance - Patient > 75% Stand Pivot Transfers: Moderate Assistance - Patient 50 - 74% Stand Pivot Transfer Details: Verbal cues for technique;Verbal cues for sequencing;Verbal cues for gait pattern Transfer (Assistive device): 1 person hand held assist Locomotion  Gait Ambulation: Yes Gait Assistance: Moderate Assistance - Patient 50-74%;Maximal Assistance - Patient 25-49% Gait Distance (Feet): 20 Feet Assistive device: 1 person hand held assist Gait Assistance Details: Verbal cues for gait pattern;Manual  facilitation for weight shifting;Verbal cues for precautions/safety;Verbal cues for technique;Manual facilitation for placement Gait Assistance Details: L ankle inversion Gait Gait: Yes Gait Pattern: Impaired Gait Pattern: Step-through pattern;Narrow base of support;Decreased step length - right;Left circumduction;Left foot flat;Lateral trunk lean to left;Trunk rotated posteriorly on left (adducted L LE) Gait velocity: decreased Stairs / Additional Locomotion Stairs: No Wheelchair Mobility Wheelchair Mobility: Yes Wheelchair Assistance: Dependent - Patient 0% Wheelchair Parts Management: Needs assistance Distance: 150   Discharge Criteria: Patient will be discharged from PT if patient refuses treatment 3 consecutive times without medical reason, if treatment goals not met, if there is a change in medical status, if patient makes no progress towards goals or if patient is discharged from hospital.  The above assessment, treatment plan, treatment alternatives and goals were discussed and mutually agreed upon: by patient  Debbora Dus 05/31/2021, 11:29 AM

## 2021-05-31 NOTE — Evaluation (Signed)
Speech Language Pathology Assessment and Plan  Patient Details  Name: Monica Miranda MRN: [id number] Date of Birth: September 17, 1960  SLP Diagnosis: Cognitive Impairments  Rehab Potential: Excellent ELOS: ~2 weeks   Today's Date: 05/31/2021 SLP Individual Time: 1105-1205 SLP Individual Time Calculation (min): 60 min  Hospital Problem: Principal Problem:   ICH (intracerebral hemorrhage) ([place])  Past Medical History:  Past Medical History:  Diagnosis Date   COPD (chronic obstructive pulmonary disease) ([place])    Rib fracture    4 months ago   Stroke [facility])    Past Surgical History:  Past Surgical History:  Procedure Laterality Date   ESOPHAGEAL DILATION      Assessment / Plan / Recommendation Clinical Impression Monica Miranda is a 60 year old right-handed female with history of hypertension, hyperlipidemia, COPD/tobacco use as well as polysubstance abuse. Per chart review patient had been living alone in [place] for the last 2 years and reportedly independent. 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 [place] was stable on multiple repeat CTs. Her drug screen was positive for cocaine and amphetamines as well as a long history of alcohol use. She was discharged planning to live with her son and daughter in law in [place] and on the drive from [place] noted increased left-sided weakness as well as altered mental status while driving through [place]. She did not seek attention for that and made it home and  presented to [facility] 05/26/2021. A CT of the head as well as cervical spine showed large intraparenchymal hemorrhage involving posterior right frontal lobe moderate amount of surrounding edema and associated mass-effect on the right lateral ventricle and proximally 11 mm right to left midline shift. MRA/MRI large intraparenchymal  hemorrhages right frontotemporal region estimated volume 54 mL. Medial extension to involve right basal ganglia thalamus right cerebral peduncle and midbrain. Tolerating a regular diet. Therapy evaluations completed due to patient's left-sided weakness decreased functional mobility was admitted for a comprehensive rehab program.  Patient seen for speech/language/cognitive evaluation. Patient reported feeling "confused", "lack of concentration and memory" s/p CVA. She was reportedly independent at prior level and plans to live with her daughter at discharge where she can receive 24 hour support. Patient presents with cognitive deficits in the areas of orientation, attention, short-term recall, problem solving, and executive functions. Patient was hyper verbal throughout session and difficult to redirect at times. Cognistat evaluation revealed mild impairment in orientation, memory, and calculations. Patient scored [facility] for abstract reasoning/judgement subtests however question safety awareness with current deficits/limitations. Patient's overall auditory comprehension and verbal expression appeared [facility] for all tasks assessed and patient was 100% intelligible at the conversation level. Patient would benefit from skilled SLP intervention to maximize her cognitive functioning and overall functional independence prior to discharge.    Skilled Therapeutic Interventions          Pt participated in Cognistat evaluation as well as further non-standardized assessments of cognitive-linguistic, speech, and language function. Please see above.     SLP Assessment  Patient will need skilled [facility] during CIR admission    Recommendations  Patient destination: Home Follow up Recommendations: Other (comment) (TBD) Equipment Recommended: None recommended by SLP    SLP Frequency 3 to 5 out of 7 days   SLP Duration  SLP Intensity  SLP Treatment/Interventions ~2 weeks  Minumum of 1-2 x/day,  30 to 90 minutes  Cognitive remediation/compensation;Environmental  controls;Internal/external aids;Cueing hierarchy;Functional tasks;Patient/family education;Therapeutic Activities    Pain Pain Assessment Pain Scale: 0-10 Pain Score: 4  Pain Type: Acute pain Pain Location: Head Pain Orientation: Anterior Pain Descriptors / Indicators: Headache Pain Onset: On-going Pain Intervention(s): Relaxation;Emotional support  Prior Functioning Cognitive/Linguistic Baseline: Within functional limits Type of Home: House  Lives With: Daughter Available Help at Discharge: Family;Available 24 hours/day Education: HS Vocation: Full time employment  SLP Evaluation Cognition Overall Cognitive Status: Impaired/Different from baseline Arousal/Alertness: Awake/alert Orientation Level: Oriented to place;Oriented to person;Oriented to situation Year: 2023 Month: December Day of Week: Incorrect Attention: Sustained Focused Attention: Appears intact Sustained Attention: Impaired Sustained Attention Impairment: Verbal basic;Functional basic Memory: Impaired Memory Impairment: Decreased recall of new information Immediate Memory Recall: Sock;Blue;Bed Memory Recall Sock: Without Cue Memory Recall Blue: Without Cue Memory Recall Bed: Without Cue Awareness: Impaired Awareness Impairment: Anticipatory impairment Problem Solving: Impaired Problem Solving Impairment: Verbal complex Executive Function: Sequencing;Organizing Sequencing: Impaired Sequencing Impairment: Verbal complex Organizing: Impaired Organizing Impairment: Verbal complex Behaviors: Perseveration Safety/Judgment: Impaired  Comprehension Auditory Comprehension Overall Auditory Comprehension: Appears within functional limits for tasks assessed Expression Expression Primary Mode of Expression: Verbal Verbal Expression Overall Verbal Expression: Appears within functional limits for tasks assessed Written Expression Dominant  Hand: Right Oral Motor Oral Motor/Sensory Function Overall Oral Motor/Sensory Function: Within functional limits Motor Speech Overall Motor Speech: Appears within functional limits for tasks assessed Intelligibility: Intelligible  Care Tool Care Tool Cognition Ability to hear (with hearing aid or hearing appliances if normally used Ability to hear (with hearing aid or hearing appliances if normally used): 0. Adequate - no difficulty in normal conservation, social interaction, listening to TV   Expression of Ideas and Wants Expression of Ideas and Wants: 3. Some difficulty - exhibits some difficulty with expressing needs and ideas (e.g, some words or finishing thoughts) or speech is not clear   Understanding Verbal and Non-Verbal Content Understanding Verbal and Non-Verbal Content: 3. Usually understands - understands most conversations, but misses some part/intent of message. Requires cues at times to understand  Memory/Recall Ability Memory/Recall Ability : Staff names and faces;That he or she is in a hospital/hospital unit;Current season   Speech Intelligibility: Intelligible  Short Term Goals: Week 1: SLP Short Term Goal 1 (Week 1): Patient will demonstrate sustained attention to functional tasks for 10 minute duration with min A verbal redirection SLP Short Term Goal 2 (Week 1): Patient will utilize external memory aids to recall new, daily information with min A verbal cues SLP Short Term Goal 3 (Week 1): Patient will orient x4 with min A cues SLP Short Term Goal 4 (Week 1): Patient will complete complex problem solving tasks with min A verbal cues  Refer to Care Plan for Long Term Goals  Recommendations for other services: Therapeutic Recreation  Pet therapy and Stress management  Discharge Criteria: Patient will be discharged from SLP if patient refuses treatment 3 consecutive times without medical reason, if treatment goals not met, if there is a change in medical status, if  patient makes no progress towards goals or if patient is discharged from hospital.  The above assessment, treatment plan, treatment alternatives and goals were discussed and mutually agreed upon: by patient  Patty Sermons 05/31/2021, 12:41 PM

## 2021-05-31 NOTE — Plan of Care (Signed)
°  Problem: RH Cognition - SLP Goal: RH LTG Patient will demonstrate orientation with cues Description:  LTG:  Patient will demonstrate orientation to person/place/time/situation with cues (SLP)   Flowsheets (Taken 05/31/2021 1244) LTG Patient will demonstrate orientation to:  Person  Place  Time  Situation LTG: Patient will demonstrate orientation using cueing (SLP): Modified Independent   Problem: RH Problem Solving Goal: LTG Patient will demonstrate problem solving for (SLP) Description: LTG:  Patient will demonstrate problem solving for basic/complex daily situations with cues  (SLP) Flowsheets (Taken 05/31/2021 1244) LTG: Patient will demonstrate problem solving for (SLP): Complex daily situations LTG Patient will demonstrate problem solving for: Supervision   Problem: RH Memory Goal: LTG Patient will use memory compensatory aids to (SLP) Description: LTG:  Patient will use memory compensatory aids to recall biographical/new, daily complex information with cues (SLP) Flowsheets (Taken 05/31/2021 1245) LTG: Patient will use memory compensatory aids to (SLP): Modified Independent   Problem: RH Attention Goal: LTG Patient will demonstrate this level of attention during functional activites (SLP) Description: LTG:  Patient will will demonstrate this level of attention during functional activites (SLP) Flowsheets (Taken 05/31/2021 1245) Patient will demonstrate during cognitive/linguistic activities the attention type of: Selective LTG: Patient will demonstrate this level of attention during cognitive/linguistic activities with assistance of (SLP): Modified Independent

## 2021-05-31 NOTE — Progress Notes (Signed)
Inpatient Rehabilitation  Patient information reviewed and entered into eRehab system by Shayne Diguglielmo M. Jordann Grime, M.A., CCC/SLP, PPS Coordinator.  Information including medical coding, functional ability and quality indicators will be reviewed and updated through discharge.    

## 2021-05-31 NOTE — Progress Notes (Signed)
Physical Therapy Session Note  Patient Details  Name: JANVI AMMAR MRN: [id number] Date of Birth: [birth year]  Today's Date: 05/31/2021 PT Individual Time: 1300-1341 PT Individual Time Calculation (min): 41 min   Short Term Goals: Week 1:  PT Short Term Goal 1 (Week 1): Patient will complete bed mobility with supervision PT Short Term Goal 2 (Week 1): Patient will complete sit <> stand with MinA consistently PT Short Term Goal 3 (Week 1): Patient will ambulate >20 ft with LRAD and MinA PT Short Term Goal 4 (Week 1): Patient will improve BBS by >/= 7pts  Skilled Therapeutic Interventions/Progress Updates:  Patient received sitting up in bed, finishing lunch, family at bedside, agreeable to PT. She remains perseverative on B12 vitamins. PT already relayed this to RN this AM after eval. Patient verbose and difficult to redirect to task at times speaking about wanting a "medical dog," then wanting her 50 yo grandson to be able to spend the night with her, then requesting that "ice cold milk" be delivered with every meal. Patient participating in ordering her meals for the next day. Noted continued strong R gaze preference despite PT standing on L side of patient. PT donning sneakers TotalA for time management to assist with managing L ankle inversion in stance. She was able to come sit edge of bed with CGA and HOB elevated. Patient with fear of falling requiring encouragement to continue task. She ambulated ~25 ft with HHA ModA. Remains with poor power modulation of L LE, extensor tone, and heavy reliance on PT on the L. Patient returning to bed via stand pivot with MinA, supine with CGA. Bed alarm on, call light within reach.   Therapy Documentation Precautions:  Precautions Precautions: Fall Precaution Comments: BP <150, L hemi, L inattention Restrictions Weight Bearing Restrictions: No    Therapy/Group: Individual Therapy  Elizebeth Koller, PT, DPT,  CBIS  05/31/2021, 11:36 AM

## 2021-05-31 NOTE — Progress Notes (Signed)
The front desk called asking if a visitor could come up for the patient. Patient was asked & she said yes for her sister inlaw, Thayer Ohm, to visit.

## 2021-05-31 NOTE — Evaluation (Signed)
Occupational Therapy Assessment and Plan  Patient Details  Name: Monica Miranda MRN: [id number] Date of Birth: [birth year]  OT Diagnosis: cognitive deficits, disturbance of vision, and muscle weakness (generalized) Rehab Potential: Rehab Potential (ACUTE ONLY): Good ELOS: 14-16 days   Today's Date: 05/31/2021 OT Individual Time: 8921-1941 OT Individual Time Calculation (min): 55 min     Hospital Problem: Principal Problem:   ICH (intracerebral hemorrhage) ([place])   Past Medical History:  Past Medical History:  Diagnosis Date   COPD (chronic obstructive pulmonary disease) ([place])    Rib fracture    4 months ago   Stroke [facility])    Past Surgical History:  Past Surgical History:  Procedure Laterality Date   ESOPHAGEAL DILATION      Assessment & Plan Clinical Impression: Monica Miranda is a 60 year old right-handed female with history of hypertension, hyperlipidemia, COPD/tobacco use as well as polysubstance abuse.  Per chart review patient had been living  alone in [place] for the last 2 years and reportedly independent.  Admission at [facility] in [facility] 12/2 to 05/13/2021 for large right thalamic/basal ganglia/frontotemporal intraparenchymal hemorrhage with an ICH score of 3 initially at outside hospital.  Platea was stable on multiple repeat CTs.  Her drug screen was positive for cocaine and amphetamines as well as a long history of alcohol use.  She was started on hypertonic saline monitor closely in the ICU required nicardipine drip.  She was discharged on salt tablets 2 g every 8 hours with sodium greater than 140 as well as started on Keppra 500 twice daily for seizure prophylaxis.  She was discharged planning to live with her son and daughter in law in [place] and on the drive from [place] noted increased left-sided weakness as well as altered mental status while driving through [place].  She did not seek attention for that and made it home and  presented to  [facility] 05/26/2021.  A CT of the head as well as cervical spine showed large intraparenchymal hemorrhage involving posterior right frontal lobe moderate amount of surrounding edema and associated mass-effect on the right lateral ventricle and proximally 11 mm right to left midline shift.  No acute traumatic cervical spine pathology.  MRA/MRI large intraparenchymal hemorrhages right frontotemporal region estimated volume 54 mL.  Medial extension to involve right basal ganglia thalamus right cerebral peduncle and midbrain.  No visible underlying lesion or structural abnormality.  Associated regional mass-effect 5 mm right to left shift as well as associated small volume subarachnoid hemorrhage within the adjacent posterior right parietal temporal region.  Echocardiogram with ejection fraction of 60 to 65% no wall motion abnormality.  Admission chemistries were unremarkable except alkaline phosphatase 138, troponin negative, urine drug screen negative.  She remains on Keppra for seizure prophylaxis.  Tolerating a regular diet.  Therapy evaluations completed due to patient's left-sided weakness decreased functional mobility was admitted for a comprehensive rehab program.Patient transferred to CIR on 05/29/2021 .    Patient currently requires mod with basic self-care skills secondary to muscle weakness and muscle joint tightness, abnormal tone, ataxia, and decreased coordination, decreased attention to left, decreased initiation, decreased attention, decreased awareness, decreased problem solving, decreased safety awareness, decreased memory, and delayed processing, and decreased standing balance, decreased postural control, hemiplegia, and decreased balance strategies.  Prior to hospitalization, patient could complete ADLs with independent .  Patient will benefit from skilled intervention to decrease level of assist with basic self-care skills prior to discharge home with care partner.  Anticipate patient  will require  24 hour supervision and follow up home health.  OT - End of Session Activity Tolerance: Tolerates 30+ min activity without fatigue Endurance Deficit: Yes Endurance Deficit Description: generalized weakness OT Assessment Rehab Potential (ACUTE ONLY): Good OT Patient demonstrates impairments in the following area(s): Perception;Balance;Cognition;Safety;Endurance;Vision;Motor;Pain;Sensory OT Basic ADL's Functional Problem(s): Bathing;Dressing;Toileting OT Transfers Functional Problem(s): Tub/Shower;Toilet OT Additional Impairment(s): Fuctional Use of Upper Extremity;None OT Plan OT Intensity: Minimum of 1-2 x/day, 45 to 90 minutes OT Frequency: 5 out of 7 days OT Duration/Estimated Length of Stay: 14-16 days OT Treatment/Interventions: Balance/vestibular training;Discharge planning;Pain management;Self Care/advanced ADL retraining;Therapeutic Activities;UE/LE Coordination activities;Cognitive remediation/compensation;Functional mobility training;Patient/family education;Skin care/wound managment;Therapeutic Exercise;Visual/perceptual remediation/compensation;Community reintegration;DME/adaptive equipment instruction;Psychosocial support;Neuromuscular re-education;UE/LE Strength taining/ROM;Splinting/orthotics OT Self Feeding Anticipated Outcome(s): no goal set OT Basic Self-Care Anticipated Outcome(s): (S) OT Toileting Anticipated Outcome(s): (S) OT Bathroom Transfers Anticipated Outcome(s): (S) OT Recommendation Recommendations for Other Services: Therapeutic Recreation consult Therapeutic Recreation Interventions: Pet therapy Patient destination: Home Follow Up Recommendations: Home health OT Equipment Recommended: To be determined   OT Evaluation Precautions/Restrictions  Precautions Precautions: Fall Precaution Comments: BP <150 Restrictions Weight Bearing Restrictions: No General Chart Reviewed: Yes Family/Caregiver Present: No    Pain Pain Assessment Pain Scale:  0-10 Pain Score: 10-Worst pain ever Pain Type: Acute pain Pain Location: Head Pain Orientation: Anterior Pain Descriptors / Indicators: Aching Pain Onset: On-going Pain Intervention(s): Relaxation;Emotional support Home Living/Prior Functioning Home Living Family/patient expects to be discharged to:: Private residence Living Arrangements: Alone Available Help at Discharge: Family, Available 24 hours/day Type of Home: House Home Access: Stairs to enter [employer] of Steps: 2 Entrance Stairs-Rails: Right, Left Home Layout: One level Bathroom Shower/Tub: Chiropodist: Standard Additional Comments: Information pulled in from chart, pt unsure of son's home set up  Lives With: Son (will move in with son and DIL) IADL History Homemaking Responsibilities: No Prior Function Level of Independence: Independent with basic ADLs, Independent with transfers, Independent with gait Vocation: Full time employment Vocation Requirements: Reports she worked with a [employer] Baseline Vision/History: 1 Wears glasses Ability to See in Adequate Light: 1 Impaired Patient Visual Report: Blurring of vision Vision Assessment?: Yes Eye Alignment: Within Functional Limits Ocular Range of Motion: Within Functional Limits Alignment/Gaze Preference: Within Defined Limits Tracking/Visual Pursuits: Decreased smoothness of horizontal tracking;Requires cues, head turns, or add eye shifts to track;Unable to hold eye position out of midline;Decreased smoothness of vertical tracking Saccades: Additional head turns occurred during testing;Additional eye shifts occurred during testing Visual Fields: Left visual field deficit Additional Comments: Decreased attention to the L, but can scan with OT in the L visual field Perception  Perception: Impaired Inattention/Neglect: Does not attend to left visual field;Does not attend to left side of body Praxis Praxis: Impaired Praxis  Impairment Details: Initiation Cognition Overall Cognitive Status: Impaired/Different from baseline Arousal/Alertness: Awake/alert Orientation Level: Person;Situation;Place Person: Oriented Place: Oriented Situation: Oriented Year:  (2012) Month: December Day of Week: Incorrect Memory: Impaired Memory Impairment: Decreased recall of new information;Retrieval deficit Immediate Memory Recall: Sock;Blue;Bed Memory Recall Sock: Without Cue Memory Recall Blue: Without Cue Memory Recall Bed: Without Cue Attention: Sustained Sustained Attention: Impaired Sustained Attention Impairment: Verbal basic;Functional basic Awareness: Impaired Awareness Impairment: Emergent impairment Safety/Judgment: Impaired Sensation Sensation Light Touch: Impaired Detail Light Touch Impaired Details: Impaired LLE;Impaired LUE Coordination Gross Motor Movements are Fluid and Coordinated: No Fine Motor Movements are Fluid and Coordinated: No Coordination and Movement Description: Moderate L hemi Motor  Motor Motor: Hemiplegia Motor - Skilled Clinical Observations: L hemi  Trunk/Postural Assessment  Cervical Assessment Cervical Assessment: Within Functional Limits Thoracic Assessment Thoracic Assessment: Within Functional Limits Lumbar Assessment Lumbar Assessment: Exceptions to [employer] (posterior pelvic tilt) Postural Control Postural Control: Deficits on evaluation Righting Reactions: delayed and inadequate  Balance Balance Balance Assessed: Yes Static Sitting Balance Static Sitting - Balance Support: Feet unsupported Static Sitting - Level of Assistance: 5: Stand by assistance Dynamic Sitting Balance Dynamic Sitting - Balance Support: Feet unsupported Dynamic Sitting - Level of Assistance: 5: Stand by assistance Static Standing Balance Static Standing - Balance Support: During functional activity Static Standing - Level of Assistance: 4: Min assist Dynamic Standing Balance Dynamic Standing -  Balance Support: During functional activity Dynamic Standing - Level of Assistance: 3: Mod assist Extremity/Trunk Assessment RUE Assessment RUE Assessment: Within Functional Limits LUE Assessment LUE Assessment: Exceptions to [facility] LUE Body System: Neuro Brunstrum levels for arm and hand: Arm;Hand Brunstrum level for arm: Stage II Synergy is developing Brunstrum level for hand: Stage IV Movements deviating from synergies LUE AROM (degrees) Left Shoulder Flexion: 35 Degrees Left Shoulder ABduction: 30 Degrees Left Composite Finger Extension: 75% Left Composite Finger Flexion: 75%  Care Tool Care Tool Self Care Eating   Eating Assist Level: Set up assist    Oral Care  Oral care, brush teeth, clean dentures activity did not occur: Refused      Bathing Bathing activity did not occur: Safety/medical concerns (no time/declined)            Upper Body Dressing(including orthotics)   What is the patient wearing?: Pull over shirt   Assist Level: Moderate Assistance - Patient 50 - 74%    Lower Body Dressing (excluding footwear)   What is the patient wearing?: Pants Assist for lower body dressing: Moderate Assistance - Patient 50 - 74%    Putting on/Taking off footwear   What is the patient wearing?: Non-skid slipper socks Assist for footwear: Minimal Assistance - Patient > 75%       Care Tool Toileting Toileting activity Toileting Activity did not occur (Clothing management and hygiene only): N/A (no void or bm)       Care Tool Bed Mobility Roll left and right activity   Roll left and right assist level: Contact Guard/Touching assist    Sit to lying activity   Sit to lying assist level: Contact Guard/Touching assist    Lying to sitting on side of bed activity   Lying to sitting on side of bed assist level: the ability to move from lying on the back to sitting on the side of the bed with no back support.: Contact Guard/Touching assist     Care Tool Transfers Sit to  stand transfer   Sit to stand assist level: Moderate Assistance - Patient 50 - 74%    Chair/bed transfer   Chair/bed transfer assist level: Moderate Assistance - Patient 50 - 74%     Toilet transfer Toilet transfer activity did not occur: Safety/medical concerns       Care Tool Cognition  Expression of Ideas and Wants Expression of Ideas and Wants: 3. Some difficulty - exhibits some difficulty with expressing needs and ideas (e.g, some words or finishing thoughts) or speech is not clear  Understanding Verbal and Non-Verbal Content Understanding Verbal and Non-Verbal Content: 3. Usually understands - understands most conversations, but misses some part/intent of message. Requires cues at times to understand   Memory/Recall Ability Memory/Recall Ability : Staff names and faces;That he or she is in a hospital/hospital unit;Current season  Refer to Care Plan for Long Term Goals  SHORT TERM GOAL WEEK 1 OT Short Term Goal 1 (Week 1): Pt will don shirt using hemi techniques with min A OT Short Term Goal 2 (Week 1): Pt will complete 3/3 toileting tasks with min A OT Short Term Goal 3 (Week 1): Pt will complete toilet transfer with min A OT Short Term Goal 4 (Week 1): Pt will don pants with min A  Recommendations for other services: Therapeutic Recreation  Pet therapy   Skilled Therapeutic Intervention  Skilled OT evaluation completed. Pt initially difficult to arouse/initiate but after several minutes with sustained stimuli she came to EOB and requested to eat breakfast.  Pt declined bathing. She required min cueing for L attention but was able to scan with only verbal cueing. Dressing completed as described below. Pt with poor insight/awareness. Pt completed mod A stand pivot transfer with HHA to the recliner. She was left sitting up with all needs met, chair alarm set.   ADL ADL Eating: Supervision/safety Where Assessed-Eating: Chair Grooming: Supervision/safety Upper Body Bathing:  Unable to assess Lower Body Bathing: Unable to assess Upper Body Dressing: Moderate assistance;Moderate cueing Where Assessed-Upper Body Dressing: Edge of bed Lower Body Dressing: Moderate cueing;Moderate assistance Where Assessed-Lower Body Dressing: Edge of bed Toileting: Unable to assess Toilet Transfer: Unable to assess Mobility  Bed Mobility Bed Mobility: Sit to Supine;Supine to Sit Supine to Sit: Contact Guard/Touching assist Sit to Supine: Contact Guard/Touching assist Transfers Sit to Stand: Moderate Assistance - Patient 50-74% Stand to Sit: Moderate Assistance - Patient 50-74%   Discharge Criteria: Patient will be discharged from OT if patient refuses treatment 3 consecutive times without medical reason, if treatment goals not met, if there is a change in medical status, if patient makes no progress towards goals or if patient is discharged from hospital.  The above assessment, treatment plan, treatment alternatives and goals were discussed and mutually agreed upon: by patient  Curtis Sites 05/31/2021, 9:36 AM

## 2021-06-01 LAB — GLUCOSE, CAPILLARY: Glucose-Capillary: 99 mg/dL (ref 70–99)

## 2021-06-01 MED ORDER — ADULT MULTIVITAMIN W/MINERALS CH
1.0000 | ORAL_TABLET | Freq: Every day | ORAL | Status: DC
Start: 2021-06-01 — End: 2021-06-17
  Administered 2021-06-01 – 2021-06-17 (×14): 1 via ORAL
  Filled 2021-06-01 (×15): qty 1

## 2021-06-01 MED ORDER — VITAMIN B-12 1000 MCG PO TABS
1000.0000 ug | ORAL_TABLET | Freq: Every day | ORAL | Status: DC
Start: 2021-06-01 — End: 2021-06-17
  Administered 2021-06-01 – 2021-06-17 (×17): 1000 ug via ORAL
  Filled 2021-06-01 (×16): qty 1

## 2021-06-01 MED ORDER — TOPIRAMATE 25 MG PO TABS
25.0000 mg | ORAL_TABLET | Freq: Two times a day (BID) | ORAL | Status: DC
  Administered 2021-06-01 – 2021-06-02 (×2): 25 mg via ORAL
  Filled 2021-06-01 (×2): qty 1

## 2021-06-01 NOTE — Progress Notes (Signed)
Occupational Therapy Session Note  Patient Details  Name: CYNIAH GOSSARD MRN: [id number] Date of Birth: [birth year]  Today's Date: 06/01/2021 OT Missed Time: 60 Minutes Missed Time Reason: Patient fatigue;Other (comment) (missed lunch, requesting to eat)   Pt received seated in w/c, with daughter in law in room. Pt reporting she didn't eat lunch because no one ordered it, and requested that therapist offer her time to eat due to fatigue/weak feeling. Pt declined seated, or out of room activities, with pt left seated in w/c, with daughter in law still in room, with all immediate needs in reach. Will attempt to revisit pt to make up missed minutes as time allows.   Aeden Matranga E Kylie Simmonds 06/01/2021, 7:43 AM

## 2021-06-01 NOTE — Progress Notes (Signed)
Physical Therapy Session Note  Patient Details  Name: Monica Miranda MRN: [id number] Date of Birth: [birth year]  Today's Date: 06/01/2021 PT Individual Time: 1351-1433 PT Individual Time Calculation (min): 42 min   Short Term Goals: Week 1:  PT Short Term Goal 1 (Week 1): Patient will complete bed mobility with supervision PT Short Term Goal 2 (Week 1): Patient will complete sit <> stand with MinA consistently PT Short Term Goal 3 (Week 1): Patient will ambulate >20 ft with LRAD and MinA PT Short Term Goal 4 (Week 1): Patient will improve BBS by >/= 7pts  Skilled Therapeutic Interventions/Progress Updates:    Pt received sitting in w/c with her daughter-in-law, [name], present and pt agreeable to therapy session. Therapist educated pt/family on ensuring L hand elevated on pillows while resting to decrease hand edema. Donned tennis shoes.  Transported to/from gym in w/c for time management and energy conservation. Sit>stands w/c>RW with mod assist for lifting and balance with max/total assist for L hand placement on/off RW orthosis. Gait training 60ft +51ft +80ft using RW with mod assist for balance and AD management due to strong L lean, L LE scissoring, and pt at risk for L ankle inversion - pt initially taking too large of step forward with L foot with scissoring and mostly performing step-to pattern - with various cuing able to achieve a reciprocal stepping pattern with more symmetrical step lengths and improved step width. Pt takes verbal cues very literal throughout session therefore this therapist has to educate pt on goal of the cue to help her understand and motor plan correctly to which pt responds well. Transported back to room and pt requesting to remain in w/c until she has eaten. Left with needs in reach, L UE supported on pillows, and seat belt alarm on.  Therapy Documentation Precautions:  Precautions Precautions: Fall Precaution Comments: BP <150, L hemi, L  inattention Restrictions Weight Bearing Restrictions: No   Pain:  No reports of pain throughout session.   Therapy/Group: Individual Therapy  Ginny Forth , PT, DPT, NCS, CSRS 06/01/2021, 12:19 PM

## 2021-06-01 NOTE — Progress Notes (Signed)
Patient ID: Monica Miranda, female   DOB: 1961/04/18, 60 y.o.   MRN: [id number] Introduction to patient in room and daughter Monica Miranda by phone to review role, rehab routine, team conference and plan of care. Discussed medications, secondary risk management and dietary modification recommendations. Discussed increased protein and calcium in diet along with skin care. Patient noted she has been thin all her life and has not smoked or had a alcoholic beverage in several months. Reviewed available physician resources for patient in [place] and [place] as no PCP PTA. [name] reported daughter in law Monica Miranda will be the primary caregiver at discharge. Continue to follow along to discharge to address educational needs and collaborate with the team to facilitate preparation for discharge. Pamelia Hoit

## 2021-06-01 NOTE — Progress Notes (Signed)
PROGRESS NOTE   Subjective/Complaints: C/o headache, reported to be 5/10 during therapy session today She asks if her brain is healing She reports decreased appetite due to bad smell of food  ROS: Patient denies fever, rash, sore throat, blurred vision, nausea, vomiting, diarrhea, cough, shortness of breath or chest pain, joint or back pain, +headache   Objective:   No results found. Recent Labs    05/31/21 0720  WBC 4.8  HGB 13.9  HCT 42.6  PLT 279   Recent Labs    05/31/21 0720  NA 138  K 3.9  CL 102  CO2 27  GLUCOSE 95  BUN 15  CREATININE 0.57  CALCIUM 9.2    Intake/Output Summary (Last 24 hours) at 06/01/2021 1320 Last data filed at 06/01/2021 0744 Gross per 24 hour  Intake 360 ml  Output --  Net 360 ml     Pressure Injury 05/25/21 Sacrum Medial;Lower Stage 1 -  Intact skin with non-blanchable redness of a localized area usually over a bony prominence. (Active)  05/25/21 2115  Location: Sacrum  Location Orientation: Medial;Lower  Staging: Stage 1 -  Intact skin with non-blanchable redness of a localized area usually over a bony prominence.  Wound Description (Comments):   Present on Admission: Yes    Physical Exam: Vital Signs Blood pressure 137/76, pulse 71, temperature 98.2 F (36.8 C), temperature source Oral, resp. rate 16, height 5\' 1"  (1.549 m), weight 44.2 kg, SpO2 98 %. Gen: no distress, normal appearing HEENT: oral mucosa pink and moist, NCAT Cardio: Reg rate Chest: normal effort, normal rate of breathing Abd: soft, non-distended Ext: no edema Psych: Pt's affect is appropriate. Pt is cooperative Skin: Clean and intact without signs of breakdown Neuro:  pt is alert and oriented to person, place. Left central 7, right gaze preference. Mild dysarthria. Poor concentration. LUE 1 to 1+/5. LLE 1+ to 2/5 HE, KE and 0/5 distally. Decreased LT left arm and leg. DTR's 3+ LLE and LUE with early  extensor and flexor tone respectively Musculoskeletal: Full ROM, No pain with AROM or PROM in the neck, trunk, or extremities. Posture appropriate     Assessment/Plan: 1. Functional deficits which require 3+ hours per day of interdisciplinary therapy in a comprehensive inpatient rehab setting. Physiatrist is providing close team supervision and 24 hour management of active medical problems listed below. Physiatrist and rehab team continue to assess barriers to discharge/monitor patient progress toward functional and medical goals  Care Tool:  Bathing  Bathing activity did not occur: Safety/medical concerns (no time/declined)           Bathing assist       Upper Body Dressing/Undressing Upper body dressing   What is the patient wearing?: Pull over shirt    Upper body assist Assist Level: Moderate Assistance - Patient 50 - 74%    Lower Body Dressing/Undressing Lower body dressing      What is the patient wearing?: Pants     Lower body assist Assist for lower body dressing: Moderate Assistance - Patient 50 - 74%     Toileting Toileting Toileting Activity did not occur (Clothing management and hygiene only): N/A (no void or bm)  Toileting assist Assist for toileting: Moderate Assistance - Patient 50 - 74%     Transfers Chair/bed transfer  Transfers assist     Chair/bed transfer assist level: Moderate Assistance - Patient 50 - 74%     Locomotion Ambulation   Ambulation assist      Assist level: Moderate Assistance - Patient 50 - 74% Assistive device: No Device Max distance: 20   Walk 10 feet activity   Assist     Assist level: Moderate Assistance - Patient - 50 - 74% Assistive device: Hand held assist   Walk 50 feet activity   Assist Walk 50 feet with 2 turns activity did not occur: Safety/medical concerns         Walk 150 feet activity   Assist Walk 150 feet activity did not occur: Safety/medical concerns         Walk 10 feet on  uneven surface  activity   Assist Walk 10 feet on uneven surfaces activity did not occur: Safety/medical concerns         Wheelchair     Assist Is the patient using a wheelchair?: Yes Type of Wheelchair: Manual    Wheelchair assist level: Dependent - Patient 0% Max wheelchair distance: 150    Wheelchair 50 feet with 2 turns activity    Assist        Assist Level: Dependent - Patient 0%   Wheelchair 150 feet activity     Assist      Assist Level: Dependent - Patient 0%   Blood pressure 137/76, pulse 71, temperature 98.2 F (36.8 C), temperature source Oral, resp. rate 16, height 5\' 1"  (1.549 m), weight 44.2 kg, SpO2 98 %.  Medical Problem List and Plan: 1.  Left-sided weakness functional deficits secondary to right thalamic/basal ganglia/frontal temporal ICH in the setting of hypertension as well as cocaine use             -patient may shower             -ELOS/Goals: 18-24 days, min assist with PT, OT, sup/min SLP             -left WFO, PRAFO's ordered  Continue CIR  -discussed timeline of healing from ICH 2.  Impaired mobility: continue Antiembolism stockings, thigh (TED hose) Bilateral lower extremities             -antiplatelet therapy: N/A 3.Headache: Increase topamax to 25mg  BID 4. Mood: Provide emotional support             -antipsychotic agents: N/A 5. Neuropsych: This patient is capable of making decisions on her own behalf. 6. Skin/Wound Care: Routine skin checks 7. Fluids/Electrolytes/Nutrition: encourage fluids  -check labs 12/26 8.  Hypertension.  d/c lisinopril.  Monitor with increased mobility 9.  Seizure prophylaxis.  Keppra 500 mg twice daily 10.  Hyperlipidemia.  Statin held given ICH. 11.  History of polysubstance abuse.  Provide counseling 12.  History of COPD/tobacco use.  Monitor oxygen saturations every shift. 13. Constipation:             -good results with sorbitol overnight 14. Decreased appetite: edited diet order with  her preferences. 15. Low protein levels: discussed good protein sources     LOS: 3 days A FACE TO FACE EVALUATION WAS PERFORMED  Cornelis Kluver P Tawfiq Favila 06/01/2021, 1:20 PM

## 2021-06-01 NOTE — Progress Notes (Signed)
Occupational Therapy Session Note  Patient Details  Name: Monica Miranda MRN: [id number] Date of Birth: 1961-01-12  Today's Date: 06/01/2021 OT Individual Time: 1300-1330 OT Individual Time Calculation (min): 30 min    Short Term Goals: Week 1:  OT Short Term Goal 1 (Week 1): Pt will don shirt using hemi techniques with min A OT Short Term Goal 2 (Week 1): Pt will complete 3/3 toileting tasks with min A OT Short Term Goal 3 (Week 1): Pt will complete toilet transfer with min A OT Short Term Goal 4 (Week 1): Pt will don pants with min A  Skilled Therapeutic Interventions/Progress Updates:   Pt greeted supine in bed  agreeable to OT intervention. Session focus on LUE AROM, LUE FMC, functional mobility, and decreasing overall burden of care.  Pt completed supine>sit with CGA exiting to R side of bed. Pt completed sit<>stand from EOB with HHA with MIN A. Pt completed stand pivot to w/c to L side with MOD HHA, pt needed MAX cues for motor planning and sequencing pivotal steps to w/c.  Remainder of session to focus on LUE ROM. Pt completed therex as indicated below to facilitate improved LUE AROM for ADL participation.  X10 shoulder shrugs X10 lap slides X10 AROM elbow flexion/extension X10 wrist supination/pronation X10 wrist flexion extension X10 digit flexion/extension. Issued pt small compliant cubes to work on functional grasp and release with LUE. Pt with tendency to keep L hand supinated needing cues to remember to pronate hand when attempting to grasp objects. pt left seated in w/c with alarm belt activated and all needs within reach.                      Therapy Documentation Precautions:  Precautions Precautions: Fall Precaution Comments: BP <150, L hemi, L inattention Restrictions Weight Bearing Restrictions: No  Pain: no pain reported during session      Therapy/Group: Individual Therapy  Barron Schmid 06/01/2021, 3:55 PM

## 2021-06-01 NOTE — IPOC Note (Signed)
Overall Plan of Care [facility]) Patient Details Name: Monica Miranda MRN: [id number] DOB: 26-May-1961  Admitting Diagnosis: ICH (intracerebral hemorrhage) [place])  Hospital Problems: Principal Problem:   ICH (intracerebral hemorrhage) (HCC)     Functional Problem List: Nursing Bowel, Medication Management, Endurance, Safety, Pain, Skin Integrity  PT Balance, Behavior, Endurance, Motor, Nutrition, Pain, Perception, Safety, Sensory, Skin Integrity  OT Perception, Balance, Cognition, Safety, Endurance, Vision, Motor, Pain, Sensory  SLP Cognition, Safety  TR         Basic ADLs: OT Bathing, Dressing, Toileting     Advanced  ADLs: OT       Transfers: PT Bed Mobility, Bed to Chair, Car, Lobbyist, Technical brewer: PT Ambulation, Psychologist, prison and probation services, Stairs     Additional Impairments: OT Fuctional Use of Upper Extremity, None  SLP Social Cognition   Memory, Attention, Social Interaction, Awareness, Problem Solving  TR      Anticipated Outcomes Item Anticipated Outcome  Self Feeding no goal set  Swallowing  N/A   Basic self-care  (S)  Toileting  (S)   Bathroom Transfers (S)  Bowel/Bladder  manage bowel with mod I assist  Transfers  spv  Locomotion  CGA  Communication  N/A  Cognition  mod I-to-sup A  Pain  pain at or below level 4 with prn meds  Safety/Judgment  Maintain safety with cues/reminders   Therapy Plan: PT Intensity: Minimum of 1-2 x/day ,45 to 90 minutes PT Frequency: 5 out of 7 days PT Duration Estimated Length of Stay: 2 weeks OT Intensity: Minimum of 1-2 x/day, 45 to 90 minutes OT Frequency: 5 out of 7 days OT Duration/Estimated Length of Stay: 14-16 days SLP Intensity: Minumum of 1-2 x/day, 30 to 90 minutes SLP Frequency: 3 to 5 out of 7 days SLP Duration/Estimated Length of Stay: ~2 weeks   Due to the current state of emergency, patients may not be receiving their 3-hours of Medicare-mandated therapy.   Team  Interventions: Nursing Interventions Bowel Management, Patient/Family Education, Disease Management/Prevention, Medication Management, Discharge Planning, Pain Management  PT interventions Ambulation/gait training, Discharge planning, Functional mobility training, Psychosocial support, Therapeutic Activities, Balance/vestibular training, Visual/perceptual remediation/compensation, Disease management/prevention, Neuromuscular re-education, Skin care/wound management, Therapeutic Exercise, Wheelchair propulsion/positioning, Cognitive remediation/compensation, DME/adaptive equipment instruction, Pain management, Splinting/orthotics, UE/LE Strength taining/ROM, Community reintegration, Development worker, international aid stimulation, Patient/family education, Museum/gallery curator, UE/LE Coordination activities  OT Interventions Warden/ranger, Discharge planning, Pain management, Self Care/advanced ADL retraining, Therapeutic Activities, UE/LE Coordination activities, Cognitive remediation/compensation, Functional mobility training, Patient/family education, Skin care/wound managment, Therapeutic Exercise, Visual/perceptual remediation/compensation, Community reintegration, Fish farm manager, Psychosocial support, Neuromuscular re-education, UE/LE Strength taining/ROM, Splinting/orthotics  SLP Interventions Cognitive remediation/compensation, Environmental controls, Internal/external aids, Cueing hierarchy, Functional tasks, Patient/family education, Therapeutic Activities  TR Interventions    SW/CM Interventions     Barriers to Discharge MD  Medical stability  Nursing Decreased caregiver support, Home environment access/layout, Medication compliance, Wound Care 1 level 2ste bil rail with son and daughter in law  PT Decreased caregiver support, Home environment access/layout, Inaccessible home environment, Lack of/limited family support, Behavior    OT      SLP      SW       Team Discharge  Planning: Destination: PT-Home ,OT- Home , SLP-Home Projected Follow-up: PT-Home health PT, 24 hour supervision/assistance, OT-  Home health OT, SLP-Other (comment) (TBD) Projected Equipment Needs: PT-To be determined, OT- To be determined, SLP-None recommended by SLP Equipment Details: PT- , OT-  Patient/family involved in discharge  planning: PT- Patient,  OT-Patient, SLP-Patient  MD ELOS: 18-24 days Medical Rehab Prognosis:  Excellent Assessment: Monica Miranda is a 60 year old woman who is admitted to CIR with left-sided weakness functional deficits secondary to right thalamic/basal ganglia/frontal temporal ICH in the setting of hypertension as well as cocaine use. Medications are being managed, and labs and vitals are being monitored regularly.      See Team Conference Notes for weekly updates to the plan of care

## 2021-06-01 NOTE — Progress Notes (Signed)
Physical Therapy Session Note  Patient Details  Name: Monica Miranda MRN: [id number] Date of Birth: 12-Nov-1960  Today's Date: 06/01/2021 PT Individual Time: 0830-0928 PT Individual Time Calculation (min): 58 min   Short Term Goals: Week 1:  PT Short Term Goal 1 (Week 1): Patient will complete bed mobility with supervision PT Short Term Goal 2 (Week 1): Patient will complete sit <> stand with MinA consistently PT Short Term Goal 3 (Week 1): Patient will ambulate >20 ft with LRAD and MinA PT Short Term Goal 4 (Week 1): Patient will improve BBS by >/= 7pts  Skilled Therapeutic Interventions/Progress Updates:    Patient received asleep in bed, easy to wake, agreeable to PT. She reports 5/10 HA, premedicated. PT providing rest breaks, distractions and repositioning to assist with pain management. Patient donning pants at bedlevel with MinA and verbal cues to attend to L side. Able to come sit edge of bed with supervision and HOB elevated. PT donning sneakers TotalA for time. ModA stand pivot to wc. PT transporting patient in wc to therapy gym for time management and energy conservation. PT applying modified hand grip to RW for patient. She ambulated 13ft with RW and ModA. Questioning pushing L vs L lateral lean with poor awareness. Blocked practice sit <> stand with mirror for visual feedback on posture and foot placement. Max cues needed for foot placement as patient would tend to have B LE together and to the R within walker frame. MinA for sit <>stand. L attention task in standing placing matching playing cards on board biased to the L. Patient at times with difficulty finding Pts hand holding card even though it was only slightly to the L of midline. Questioning visual field cut? Patient able to accurately match cards, but with deterioration of balance. Patient not able to correct balance until PT placed mirror in front of patient and she could see her foot placement and L lean. Patient ambulating  final 23ft to wc. Transport back to room. Patient requesting to change into shirt. ModA with verbal cues for hemi technique to do so. ModA stand pivot to bed. Bed alarm on, call light within reach.   Therapy Documentation Precautions:  Precautions Precautions: Fall Precaution Comments: BP <150, L hemi, L inattention Restrictions Weight Bearing Restrictions: No   Therapy/Group: Individual Therapy  Elizebeth Koller, PT, DPT, CBIS  06/01/2021, 7:32 AM

## 2021-06-02 MED ORDER — HOME MED STORE IN PYXIS: 1.0000 | Freq: Two times a day (BID) | Status: DC

## 2021-06-02 MED ORDER — TOPIRAMATE 25 MG PO TABS
50.0000 mg | ORAL_TABLET | Freq: Two times a day (BID) | ORAL | Status: DC
  Administered 2021-06-02 – 2021-06-03 (×2): 50 mg via ORAL
  Filled 2021-06-02 (×2): qty 2

## 2021-06-02 MED ORDER — BUTALBITAL-APAP-CAFFEINE 50-325-40 MG PO TABS
1.0000 | ORAL_TABLET | Freq: Four times a day (QID) | ORAL | Status: DC | PRN
  Administered 2021-06-02 – 2021-06-15 (×19): 1 via ORAL
  Filled 2021-06-02 (×20): qty 1

## 2021-06-02 NOTE — Progress Notes (Signed)
Occupational Therapy Session Note  Patient Details  Name: Monica Miranda MRN: [id number] Date of Birth: November 18, 1960  Today's Date: 06/02/2021 OT Missed Time: 30 Minutes Missed Time Reason: Patient fatigue;Pain   Skilled Therapeutic Interventions/Progress Updates:  Pt received upright in bed in the dark, eating breakfast. Pt with report of significant migraine pain, with report that it was pre-medicated and RN aware. Pt declined OOB, bed level activities or repositioning, with request to let migraine pain subside. Will follow up for missed mins as time allows. Pt left upright in bed, with bed alarm on and all needs in reach at therapist departure.     Pain: unrated pain in head, RN aware and pt pre-medicated   Amarria Andreasen E Ennis Heavner 06/02/2021, 7:51 AM

## 2021-06-02 NOTE — Progress Notes (Addendum)
Inpatient Rehabilitation Care Coordinator Assessment and Plan Patient Details  Name: Monica Miranda MRN: [id number] Date of Birth: May 31, 1961  Today's Date: 06/02/2021  Hospital Problems: Principal Problem:   ICH (intracerebral hemorrhage) (HCC)  Past Medical History:  Past Medical History:  Diagnosis Date   COPD (chronic obstructive pulmonary disease) (HCC)    Rib fracture    4 months ago   Stroke [facility]    Past Surgical History:  Past Surgical History:  Procedure Laterality Date   ESOPHAGEAL DILATION     Social History:  reports that she has been smoking cigarettes. She has been smoking an average of .5 packs per day. She has never used smokeless tobacco. She reports current alcohol use of about 1.0 standard drink per week. She reports current drug use. Drugs: Marijuana and Cocaine.  Family / Support Systems Marital Status: Separated Patient Roles: Parent Children: [name]-daughter [phone]  Mandy-daughter in-law [phone] Other Supports: Beth-sister Anticipated Caregiver: [name] and [name] Ability/Limitations of Caregiver: Both have been providing care since early 05/2021 and hope to get her at min assist level Caregiver Availability: 24/7 Family Dynamics: Close family who will provide assist to those when needed. Have moved pt here from [place] to be with family members and for her care  Social History Preferred language: English Religion: [employer] Cultural Background: no issues Education: HS Health Literacy - How often do you need to have someone help you when you read instructions, pamphlets, or other written material from your doctor or pharmacy?: Sometimes Writes: Yes Employment Status: Unemployed Date Retired/Disabled/Unemployed: Since CVA Legal History/Current Legal Issues: No issues Guardian/Conservator: none-according to MD pt is capable of making her own decisions will make sure a family member is also involved due to stroke deficits.    Abuse/Neglect Abuse/Neglect Assessment Can Be Completed: Yes Physical Abuse: Denies Verbal Abuse: Denies Sexual Abuse: Denies Exploitation of patient/patient's resources: Denies Self-Neglect: Denies  Patient response to: Social Isolation - How often do you feel lonely or isolated from those around you?: Never  Emotional Status Pt's affect, behavior and adjustment status: Pt has difficulty speaking due to her CVA. Obtained information from [place] along with pt. Pt has always been independent and taken care of herself, this has made her re-evalute her goals and life style. 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 Plans to change her ways and get off of the drugs and alcohol. Has since early 05/2021 Will provide with resources at [place]  Patient / Family Perceptions, Expectations & Goals Pt/Family understanding of illness & functional limitations: [name] and pt can explain her stroke and deficits do talk with the MD when rounding. Both are hopeful she will do well here and make good progress. Premorbid pt/family roles/activities: Mom, mother in-law employee, etc Anticipated changes in roles/activities/participation: resume Pt/family expectations/goals: Pt states: " I want to do well."  mandy states: " We are hopeful she will do well here and glad she is on rehab."  [employer]: None Premorbid Home Care/DME Agencies: None Transportation available at discharge: family members Is the patient able to respond to transportation needs?: Yes In the past 12 months, has lack of transportation kept you from medical appointments or from getting medications?: No In the past 12 months, has  lack of transportation kept you from meetings, work, or from getting things needed for daily living?: No Resource referrals recommended: Neuropsychology  Discharge Planning Living Arrangements: Children, Other relatives Support Systems: Children, Other relatives Type of Residence: Private residence Insurance Resources: Customer service manager Resources: Family Support Financial Screen Referred: Yes Living Expenses: Lives with family Money Management: Family Does the patient have any problems obtaining your medications?: Yes (Describe) (uninsured) Home Management: Family members Patient/Family Preliminary Plans: Return home with son and daughter in-law who has been providing care since early december along with her daughter Elmarie Shiley. Both are hopeful she can be supervision-min level at DC from here Care Coordinator Barriers to Discharge: Insurance for SNF coverage Care Coordinator Anticipated Follow Up Needs: HH/OP  Clinical Impression Pleasant pt who is willing to work hard to recover from this stroke. Her family is very involved and willing to assist and have been since early December since came here from [place]. Will apply for SSD and Medicaid since uninsured and work on discharge needs.  Lucy Chris 06/02/2021, 9:42 AM

## 2021-06-02 NOTE — Progress Notes (Signed)
Slept intermittently after pain meds administered. Continent of bowel and bladder. Rashes noted on right butt check (possibly diaper rash). To notify next shift. Safety maintained.

## 2021-06-02 NOTE — Patient Care Conference (Signed)
Inpatient RehabilitationTeam Conference and Plan of Care Update Date: 06/02/2021   Time: 11:10 AM    Patient Name: Monica Miranda      Medical Record Number: [id number]  Date of Birth: 1961/05/25 Sex: Female         Room/Bed: 4M12C/4M12C-01 Payor Info: Payor: MEDICAID PENDING / Plan: MEDICAID PENDING / Product Type: *No Product type* /    Admit Date/Time:  05/29/2021  3:18 PM  Primary Diagnosis:  ICH (intracerebral hemorrhage) [facility])  Hospital Problems: Principal Problem:   ICH (intracerebral hemorrhage) [facility])    Expected Discharge Date: Expected Discharge Date: 06/17/21  Team Members Present: Physician leading conference: Dr. Sula Soda Social Worker Present: Dossie Der, LCSW Nurse Present: Chana Bode, RN PT Present: Ernestene Kiel, PT OT Present: Perrin Maltese, OT SLP Present: Gerda Diss, SLP     Current Status/Progress Goal Weekly Team Focus  Bowel/Bladder   Incontinent of B/B  Decrease episodes of incontinence  Assess qshift & PRN   Swallow/Nutrition/ Hydration             ADL's   Mod A bathing, Mod A UBD, Max A LBD, Mod A toileting, Min-mod A functional transfers stand pivot using RW  Supervision  Standing balance, functional transfers, endurance, LUE NMR   Mobility   CGA bed mob, ModA/MinA stand pivot transfers, ModA gait up to 82ft RW, MinA STS, has not done stairs yet. Perseverative on pain and seeing therapy dog where it detracts from therapy session, L inattention/neglect with probable visual field cut, poor proprioception L hemibody, not safe to be left in wc alone in her room due to this (body body awareness)  spv  L attention, L hemi NMR, transfers, gait progressions, Pt/fam ed   Communication             Safety/Cognition/ Behavioral Observations  min A (can require mod A redirection due to impaired attention)  mod I-to-sup A  orientation to time, functional recall with use of compensatory aids, complex problem solving, sustained attention    Pain   Pt. c/o migraine headache PRN topamax effective  Decrease use of PRN medications to control pain.  Assess qshift & PRN   Skin   No skin impairments noted  Remain dry & intact  Assess qshift & PRN     Discharge Planning:  HOme with son and daughter in-law-Mandy who has been providing care. [name]-daughter also will assist   Team Discussion: Patient post ICH with HA managed with Topamax and tylenol prn. Patient with motor planning deficits, extensor tone, left inattention, possible visual field cut and hemiparesis. Patient with poor safety awareness and perseveration (pet therapy); unable to focus enough to follow cues.  Patient on target to meet rehab goals: yes, currently min - mod assist for stand pivot transfers and able to ambulate up to 50' with min assist.  Needs mod assist for bathing and dressing. Requires moderate cueing to maintain attention and working on sustained attention, recall and orientation. Goals for discharge set for supervision overall and mod assist for cognition.  *See Care Plan and progress notes for long and short-term goals.   Revisions to Treatment Plan:  Recommend clothes and shoes from home   Teaching Needs: Safety, medications, secondary risk management, dietary modifications, transfers, etc.   Current Barriers to Discharge: Decreased caregiver support  Possible Resolutions to Barriers: Family education with son/daughter-in-law Recommend 24/7 supervision/care     Medical Summary Current Status: headache with photosensitivity, ICH, low albumin, underweight BMI 18.41, cocaine use outpatient,  stage 1 pressure injury to sacrum, hypotension  Barriers to Discharge: Medical stability;Wound care;Weight  Barriers to Discharge Comments: headache with photosensitivity, ICH, low albumin, underweight BMI 18.41, cocaine use outpatient, stage 1 pressure injury to sacrum, hypotension Possible Resolutions to [employer] Focus: increase topamax, educated  regarding ICH/prognosis/headaches, provided dietary education, provide substance abuse counseling, continue to monitor BP TID   Continued Need for Acute Rehabilitation Level of Care: The patient requires daily medical management by a physician with specialized training in physical medicine and rehabilitation for the following reasons: Direction of a multidisciplinary physical rehabilitation program to maximize functional independence : Yes Medical management of patient stability for increased activity during participation in an intensive rehabilitation regime.: Yes Analysis of laboratory values and/or radiology reports with any subsequent need for medication adjustment and/or medical intervention. : Yes   I attest that I was present, lead the team conference, and concur with the assessment and plan of the team.   Chana Bode B 06/02/2021, 3:07 PM

## 2021-06-02 NOTE — Progress Notes (Signed)
Inpatient Rehabilitation Center Individual Statement of Services  Patient Name:  Monica Miranda  Date:  06/02/2021  Welcome to the Inpatient Rehabilitation Center.  Our goal is to provide you with an individualized program based on your diagnosis and situation, designed to meet your specific needs.  With this comprehensive rehabilitation program, you will be expected to participate in at least 3 hours of rehabilitation therapies Monday-Friday, with modified therapy programming on the weekends.  Your rehabilitation program will include the following services:  Physical Therapy (PT), Occupational Therapy (OT), Speech Therapy (ST), 24 hour per day rehabilitation nursing, Therapeutic Recreaction (TR), Neuropsychology, Care Coordinator, Rehabilitation Medicine, Nutrition Services, and Pharmacy Services  Weekly team conferences will be held on Wednesday to discuss your progress.  Your Inpatient Rehabilitation Care Coordinator will talk with you frequently to get your input and to update you on team discussions.  Team conferences with you and your family in attendance may also be held.  Expected length of stay: 14-16 days  Overall anticipated outcome: supervision-min level  Depending on your progress and recovery, your program may change. Your Inpatient Rehabilitation Care Coordinator will coordinate services and will keep you informed of any changes. Your Inpatient Rehabilitation Care Coordinator's name and contact numbers are listed  below.  The following services may also be recommended but are not provided by the Inpatient Rehabilitation Center:   Home Health Rehabiltiation Services Outpatient Rehabilitation Services    Arrangements will be made to provide these services after discharge if needed.  Arrangements include referral to agencies that provide these services.  Your insurance has been verified to be:  uninsured-applying for Medicaid Your primary doctor is:  None  Pertinent  information will be shared with your doctor and your insurance company.  Inpatient Rehabilitation Care Coordinator:  Dossie Der, [place] [phone] or [place]  Information discussed with and copy given to patient by: Lucy Chris, 06/02/2021, 9:26 AM

## 2021-06-02 NOTE — Procedures (Incomplete)
Patient restless. Difficulty sleeping due to recurrent headaches. PRN administered and was effective for a short period of time (approx 3 hours). Last BM 12/25.

## 2021-06-02 NOTE — Progress Notes (Signed)
Patient ID: Monica Miranda, female   DOB: Sep 02, 1960, 60 y.o.   MRN: [id number] Met with pt, Monica Miranda present in her room and Monica Miranda-daughter who was on speaker phone to give team conference update. Goals of supervision-min assist level and target discharge date of 1/12. Many questions regarding disability, medicaid and PCP. Daughter was called regarding phone interview for medicaid/disability for 1/9. Monica Miranda will get with Monica Miranda regarding this. Monica Miranda expressed concerns over care and wanted to speak with Medical Director, expressed pt's MD is Dr Meredith Leeds reports she wants to talk with Dr Naaman Plummer her family knows him personally. Have messaged Dr Naaman Plummer to contact [facility], aware it may be tomorrow due to he is in clinic today. Pt reports appreciation for meeting with them and working with her. Continue to work on discharge needs.

## 2021-06-02 NOTE — Progress Notes (Signed)
PROGRESS NOTE   Subjective/Complaints: Team conference today Headache still is an issue and inhibited her participation in therapy today, will increase topamax to 50mg  BID She does not like our sneakers  ROS: Patient denies fever, rash, sore throat, blurred vision, nausea, vomiting, diarrhea, cough, shortness of breath or chest pain, joint or back pain, +headache   Objective:   No results found. Recent Labs    05/31/21 0720  WBC 4.8  HGB 13.9  HCT 42.6  PLT 279   Recent Labs    05/31/21 0720  NA 138  K 3.9  CL 102  CO2 27  GLUCOSE 95  BUN 15  CREATININE 0.57  CALCIUM 9.2    Intake/Output Summary (Last 24 hours) at 06/02/2021 0955 Last data filed at 06/02/2021 0800 Gross per 24 hour  Intake 980 ml  Output 300 ml  Net 680 ml     Pressure Injury 05/25/21 Sacrum Medial;Lower Stage 1 -  Intact skin with non-blanchable redness of a localized area usually over a bony prominence. (Active)  05/25/21 2115  Location: Sacrum  Location Orientation: Medial;Lower  Staging: Stage 1 -  Intact skin with non-blanchable redness of a localized area usually over a bony prominence.  Wound Description (Comments):   Present on Admission: Yes    Physical Exam: Vital Signs Blood pressure 112/70, pulse 61, temperature 97.7 F (36.5 C), temperature source Oral, resp. rate 18, height 5\' 1"  (1.549 m), weight 44.2 kg, SpO2 100 %. Gen: no distress, normal appearing, curled up under pillow due to her photosensitivity.  HEENT: oral mucosa pink and moist, NCAT Cardio: Reg rate Chest: normal effort, normal rate of breathing Abd: soft, non-distended Ext: no edema Psych: Pt's affect is appropriate. Pt is cooperative Skin: Clean and intact without signs of breakdown Neuro:  pt is alert and oriented to person, place. Left central 7, right gaze preference. Mild dysarthria. Poor concentration. LUE 1 to 1+/5. LLE 1+ to 2/5 HE, KE and 0/5  distally. Decreased LT left arm and leg. DTR's 3+ LLE and LUE with early extensor and flexor tone respectively Musculoskeletal: Full ROM, No pain with AROM or PROM in the neck, trunk, or extremities. Posture appropriate     Assessment/Plan: 1. Functional deficits which require 3+ hours per day of interdisciplinary therapy in a comprehensive inpatient rehab setting. Physiatrist is providing close team supervision and 24 hour management of active medical problems listed below. Physiatrist and rehab team continue to assess barriers to discharge/monitor patient progress toward functional and medical goals  Care Tool:  Bathing  Bathing activity did not occur: Safety/medical concerns (no time/declined)           Bathing assist       Upper Body Dressing/Undressing Upper body dressing   What is the patient wearing?: Pull over shirt    Upper body assist Assist Level: Moderate Assistance - Patient 50 - 74%    Lower Body Dressing/Undressing Lower body dressing      What is the patient wearing?: Pants     Lower body assist Assist for lower body dressing: Moderate Assistance - Patient 50 - 74%     Toileting Toileting Toileting Activity did not occur 2116  management and hygiene only): N/A (no void or bm)  Toileting assist Assist for toileting: Moderate Assistance - Patient 50 - 74%     Transfers Chair/bed transfer  Transfers assist     Chair/bed transfer assist level: Moderate Assistance - Patient 50 - 74% (HHA stand pivot transfer)     Locomotion Ambulation   Ambulation assist      Assist level: Moderate Assistance - Patient 50 - 74% Assistive device: No Device Max distance: 20   Walk 10 feet activity   Assist     Assist level: Moderate Assistance - Patient - 50 - 74% Assistive device: Hand held assist   Walk 50 feet activity   Assist Walk 50 feet with 2 turns activity did not occur: Safety/medical concerns         Walk 150 feet  activity   Assist Walk 150 feet activity did not occur: Safety/medical concerns         Walk 10 feet on uneven surface  activity   Assist Walk 10 feet on uneven surfaces activity did not occur: Safety/medical concerns         Wheelchair     Assist Is the patient using a wheelchair?: Yes Type of Wheelchair: Manual    Wheelchair assist level: Dependent - Patient 0% Max wheelchair distance: 150    Wheelchair 50 feet with 2 turns activity    Assist        Assist Level: Dependent - Patient 0%   Wheelchair 150 feet activity     Assist      Assist Level: Dependent - Patient 0%   Blood pressure 112/70, pulse 61, temperature 97.7 F (36.5 C), temperature source Oral, resp. rate 18, height 5\' 1"  (1.549 m), weight 44.2 kg, SpO2 100 %.  Medical Problem List and Plan: 1.  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  Impaired mobility: continue Antiembolism stockings, thigh (TED hose) Bilateral lower extremities             -antiplatelet therapy: N/A 3.Headache: Increase topamax to 50mg  BID 4. Mood: Provide emotional support             -antipsychotic agents: N/A 5. Neuropsych: This patient is capable of making decisions on her own behalf. 6. Skin/Wound Care: Routine skin checks 7. Fluids/Electrolytes/Nutrition: encourage fluids  -check labs 12/26 8.  Hypertension.  d/c lisinopril.  Monitor with increased mobility 9.  Seizure prophylaxis.  Keppra 500 mg twice daily 10.  Hyperlipidemia.  Statin held given ICH. 11.  History of polysubstance abuse.  Provide counseling 12.  History of COPD/tobacco use.   Monitor oxygen saturations every shift. 13. Constipation:             -good results with sorbitol overnight 14. Decreased appetite: edited diet order with her preferences. 15. Low protein levels: discussed good protein sources  16. Transaminitis: Patient may use on med NAC from home.     LOS: 4 days A FACE TO FACE EVALUATION WAS PERFORMED  Monica Miranda P Buckley Bradly  06/02/2021, 9:55 AM

## 2021-06-02 NOTE — Progress Notes (Signed)
Occupational Therapy Session Note  Patient Details  Name: Monica Miranda MRN: [id number] Date of Birth: 05/14/1961  Today's Date: 06/02/2021 OT Individual Time: 1100-1202 OT Individual Time Calculation (min): 62 min    Short Term Goals: Week 1:  OT Short Term Goal 1 (Week 1): Pt will don shirt using hemi techniques with min A OT Short Term Goal 2 (Week 1): Pt will complete 3/3 toileting tasks with min A OT Short Term Goal 3 (Week 1): Pt will complete toilet transfer with min A OT Short Term Goal 4 (Week 1): Pt will don pants with min A  Skilled Therapeutic Interventions/Progress Updates:  Pt greeted supine in bed  agreeable to OT intervention. Session focus on BADL reeducation, functional mobility, dynamic standing balance and decreasing overall caregiver burden. Pt reports need to void bladder, CGA for supine>sit with MOD cues for sequencing with pt exiting to R side of bed. MIN A to stand from EOB with Rw and MIN A to stand pivot from EOB>BSC. MOD A for 3/3 toileting tasks needing most assist for clothing mgmt and balance during sit<>stand. Pt completed ~ 6 ft of functional mobility to sink for bathing with Rw. Pt requires MOD A overall for bathing needing most assist to for standing balance when washing LB. Pt completed UB dressing from w/c with MOD A needing most assist to orient shirt and thread LUE through sleeve, MAX A for LB dressing with pt needing most assist for balance when pulling pants up to waist line. Pt needed MAX A to don socks. Pt left seated in w/c with NT present assisting pt back to bed.                             Therapy Documentation Precautions:  Precautions Precautions: Fall Precaution Comments: BP <150, L hemi, L inattention Restrictions Weight Bearing Restrictions: No  Pain: unrated pain reported from HA, rest breaks and distraction utilized as pain mgmt strategies.     Therapy/Group: Individual Therapy  Pollyann [facility] 06/02/2021, 12:28 PM

## 2021-06-02 NOTE — Progress Notes (Signed)
Physical Therapy Session Note  Patient Details  Name: Monica Miranda MRN: [id number] Date of Birth: 05/21/1961  Today's Date: 06/02/2021 PT Missed Time: 30 Minutes; 30 mins Missed Time Reason: Pain;Patient unwilling to participate; pain/refusal   Short Term Goals: Week 1:  PT Short Term Goal 1 (Week 1): Patient will complete bed mobility with supervision PT Short Term Goal 2 (Week 1): Patient will complete sit <> stand with MinA consistently PT Short Term Goal 3 (Week 1): Patient will ambulate >20 ft with LRAD and MinA PT Short Term Goal 4 (Week 1): Patient will improve BBS by >/= 7pts  Skilled Therapeutic Interventions/Progress Updates:    Session 1: Patient received supine in bed, blankets over her face. Patient reporting an "excruciating headache" and declines participation in therapy at this time. RN aware. Bed alarm on, needs within reach.   Session 2: Patient received sitting on BSC with NT present. She continues to report 10/10HA and feeling light headed. RN aware. Patient declining to participate in therapy at this time.   Therapy Documentation Precautions:  Precautions Precautions: Fall Precaution Comments: BP <150, L hemi, L inattention Restrictions Weight Bearing Restrictions: No     Therapy/Group: Individual Therapy  Elizebeth Koller, PT, DPT, CBIS  06/02/2021, 7:46 AM

## 2021-06-02 NOTE — Progress Notes (Signed)
Talked to patient, patient wants to remain in M12 at this time. Offered an available room and understands that room offered will not be available as of later today. Patient requested to stay in M12.

## 2021-06-03 ENCOUNTER — Inpatient Hospital Stay (HOSPITAL_COMMUNITY): Payer: Medicaid Other

## 2021-06-03 IMAGING — CT CT HEAD W/O CM
4 series · 16 of 47 positions shown, 18 images · non-contrast
Comparison: [DATE]

CLINICAL DATA: Headaches, recent intracerebral hematoma

EXAM:
CT HEAD WITHOUT CONTRAST
TECHNIQUE: Contiguous axial images were obtained from the base of the skull
through the vertex without intravenous contrast.

[Series 3: head without · axial · non-contrast · 0.41mm/px · z∈[-63,+57]mm · 7 of 33 slices shown, 9 images]
[im 5/33  brain]
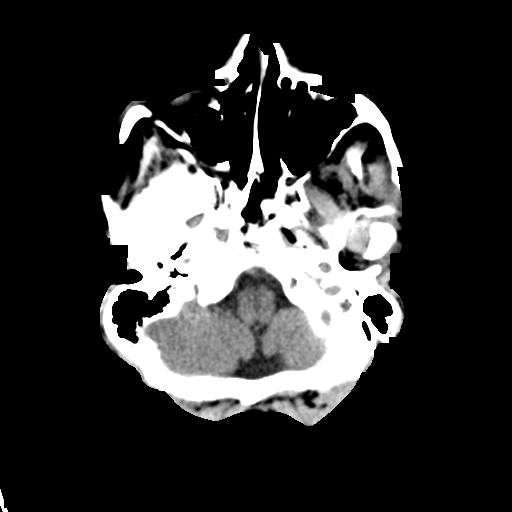
[im 5/33  bone]
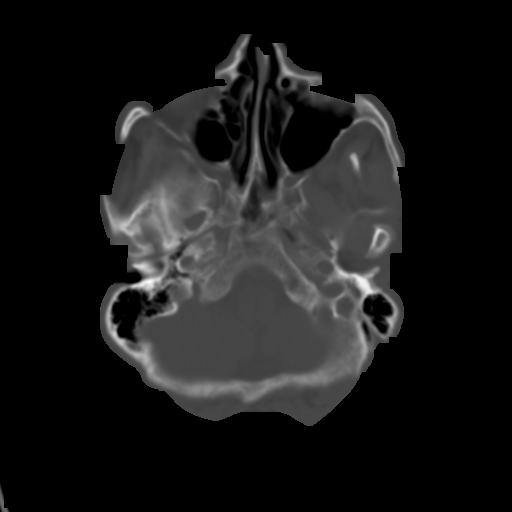
[im 9/33  brain]
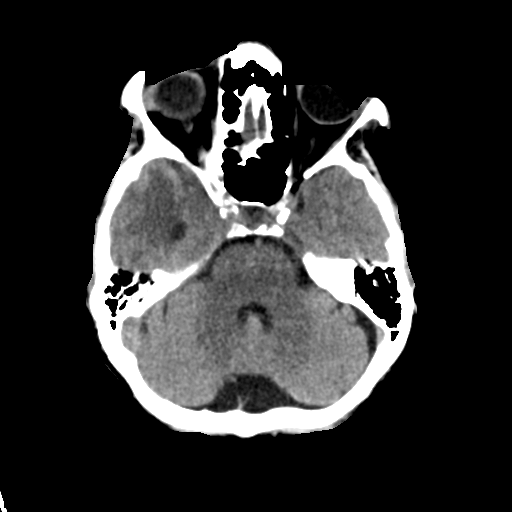
[im 13/33  brain]
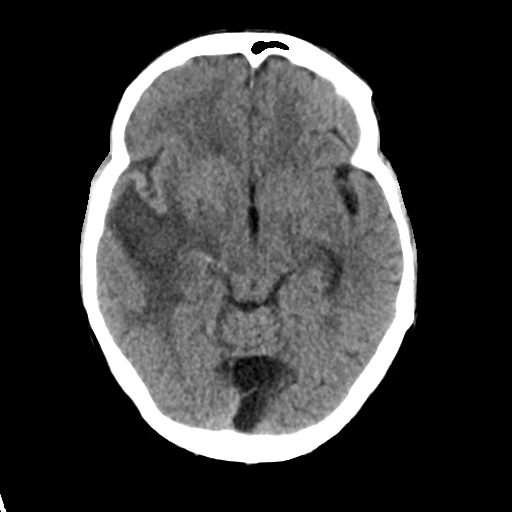
[im 17/33  brain]
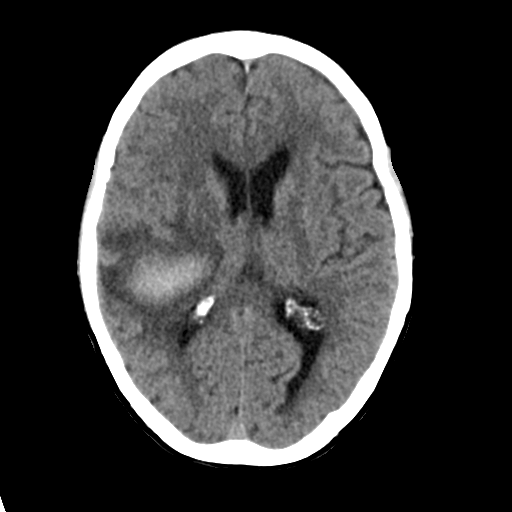
[im 21/33  brain]
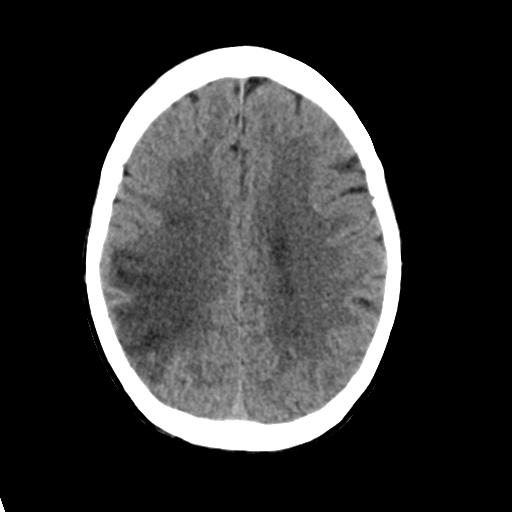
[im 21/33  bone]
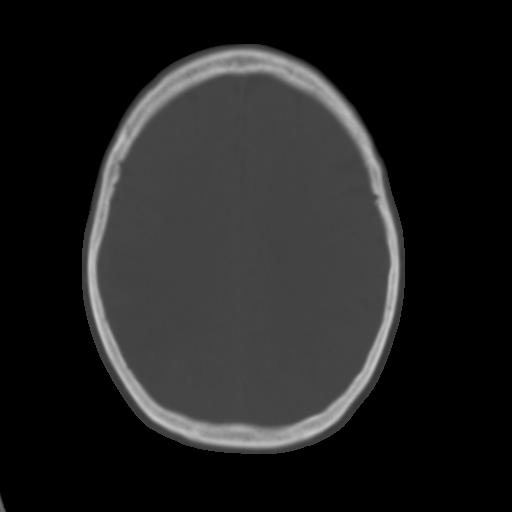
[im 25/33  brain]
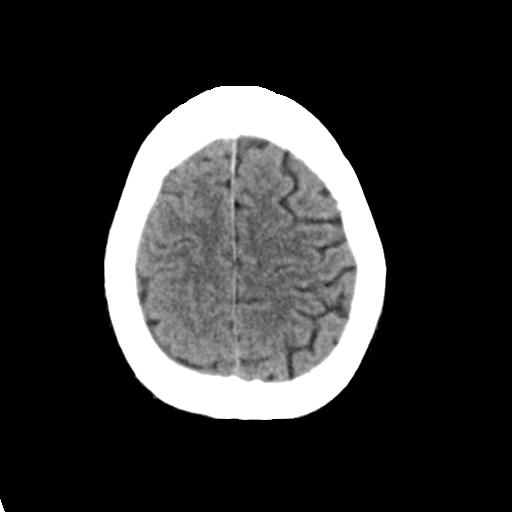
[im 29/33  brain]
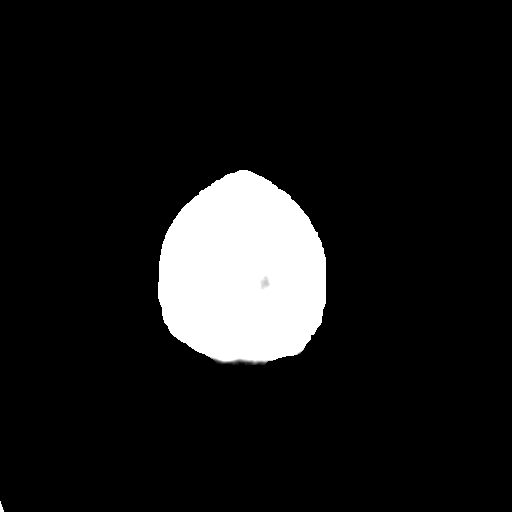

[Series 4: head bone · axial · 0.41mm/px · z∈[-67,-35]mm · 3 of 83 slices shown]
[im 9/83  bone]
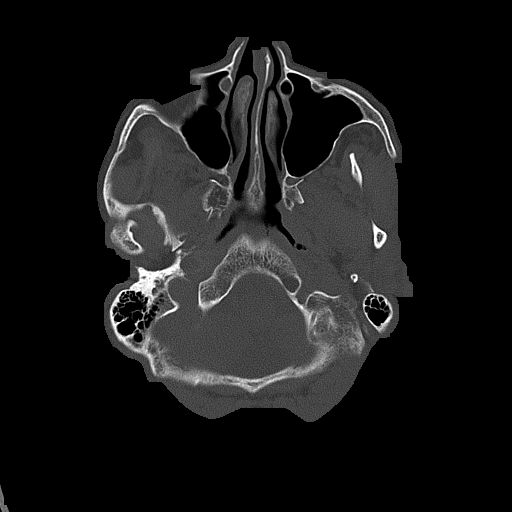
[im 17/83  bone]
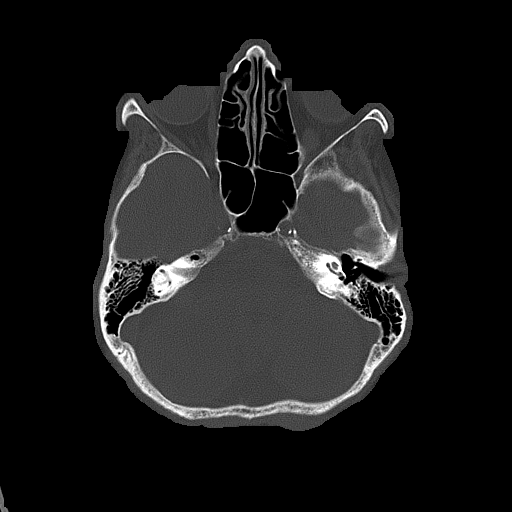
[im 25/83  bone]
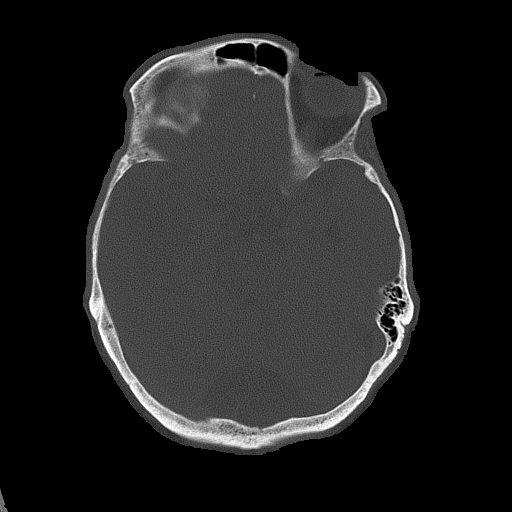

[Series 5: head without cor · coronal · non-contrast · 0.32mm/px · 3 of 67 slices shown]
[im 23/67  brain]
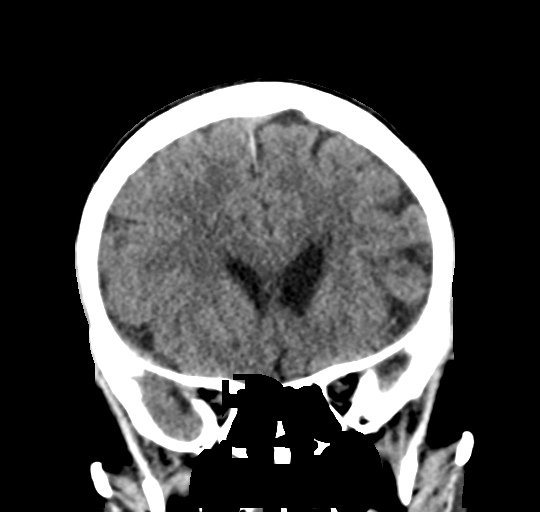
[im 30/67  brain]
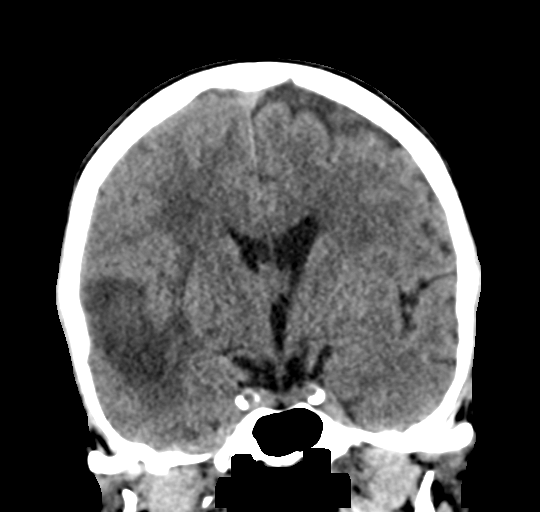
[im 37/67  brain]
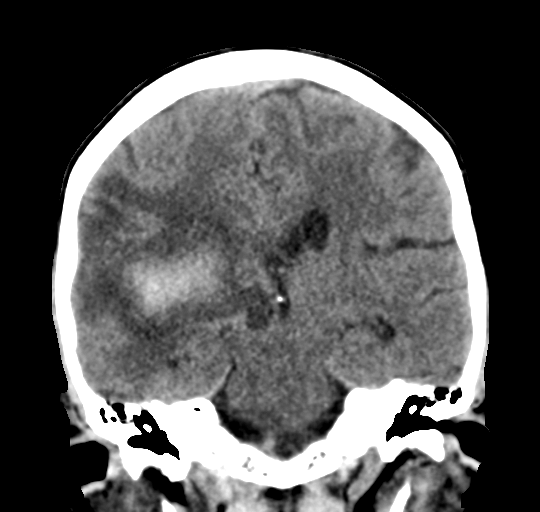

[Series 6: head without sag · sagittal · non-contrast · 0.37mm/px · 3 of 66 slices shown]
[im 22/66  brain]
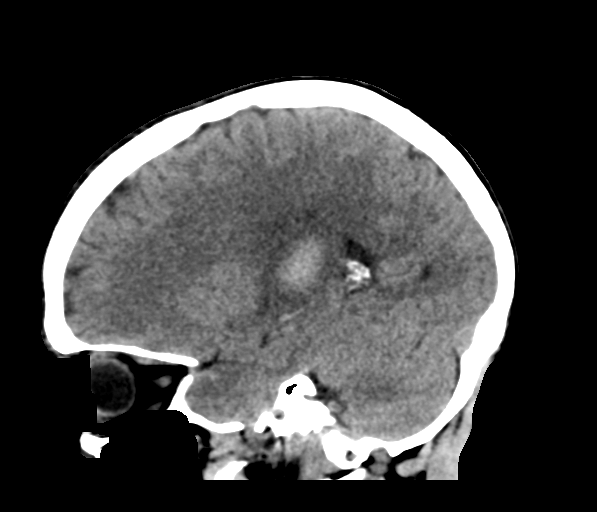
[im 33/66  brain]
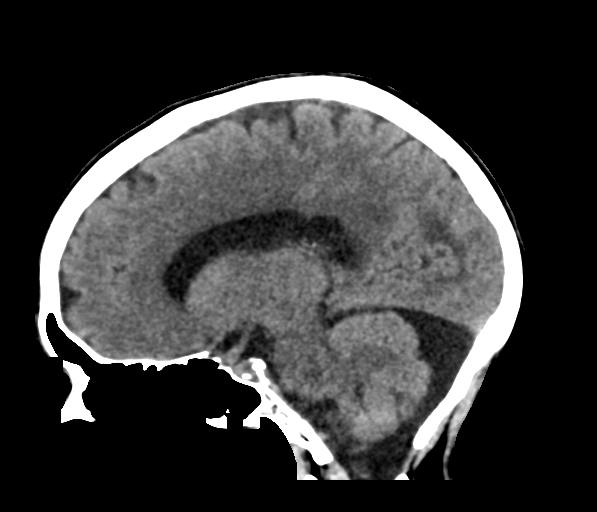
[im 44/66  brain]
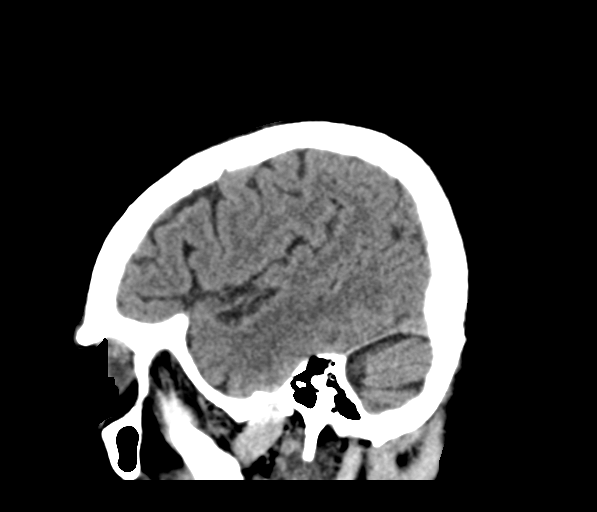

[16 of 47 positions shown; findings below may reference images not displayed]

FINDINGS: Brain: There is 2.9 x 2.3 cm intracerebral hematoma in the right or
frontotemporal with surrounding edema. This hematoma measured 4.1 cm
in maximum diameter in the previous study. There is interval
decrease in degree of mass effect. In the current study, there is
approximately 4 mm shift of midline structures to the left. In the
previous CT, there was 11 mm shift to left. There is mild effacement
of cortical sulci in right cerebral hemisphere with interval
improvement. There is 11 mm smooth marginated fluid density lesion
in the right thalamus. Cisterna magna is prominent in size.

Vascular: There are scattered arterial calcifications.

Skull: Unremarkable.

Sinuses/Orbits: Unremarkable.

Other: None
IMPRESSION: There is 2.9 x 2.3 cm intracerebral hematoma in the right
frontotemporal region with surrounding edema. There is decrease in
size of intra cerebral hematoma in this region. There are no new
foci of bleeding within the cranium. There is approximately 4 mm
shift of midline structures to the left caused by the right
intraparenchymal hematoma with interval improvement. There is 11 mm
fluid density structure in the right thalamus suggesting lacunar
infarct.

## 2021-06-03 MED ORDER — TOPIRAMATE 25 MG PO TABS
75.0000 mg | ORAL_TABLET | Freq: Two times a day (BID) | ORAL | Status: DC
  Administered 2021-06-03 – 2021-06-04 (×2): 75 mg via ORAL
  Filled 2021-06-03 (×2): qty 3

## 2021-06-03 NOTE — Progress Notes (Signed)
PROGRESS NOTE   Subjective/Complaints: Headache worsening. Discussed getting repeat Head CT and increasing Topamax and she is agreeable to both She misses her pets from home and I told her we do allow pets to visit  ROS: Patient denies fever, rash, sore throat, blurred vision, nausea, vomiting, diarrhea, cough, shortness of breath or chest pain, joint or back pain, +headache- worsening   Objective:   No results found. No results for input(s): WBC, HGB, HCT, PLT in the last 72 hours.  No results for input(s): NA, K, CL, CO2, GLUCOSE, BUN, CREATININE, CALCIUM in the last 72 hours.   Intake/Output Summary (Last 24 hours) at 06/03/2021 1154 Last data filed at 06/03/2021 0856 Gross per 24 hour  Intake 600 ml  Output 350 ml  Net 250 ml     Pressure Injury 05/25/21 Sacrum Medial;Lower Stage 1 -  Intact skin with non-blanchable redness of a localized area usually over a bony prominence. (Active)  05/25/21 2115  Location: Sacrum  Location Orientation: Medial;Lower  Staging: Stage 1 -  Intact skin with non-blanchable redness of a localized area usually over a bony prominence.  Wound Description (Comments):   Present on Admission: Yes    Physical Exam: Vital Signs Blood pressure 137/87, pulse 77, temperature 98.6 F (37 C), resp. rate 18, height 5\' 1"  (1.549 m), weight 44.2 kg, SpO2 97 %. Gen: no distress, normal appearing HEENT: oral mucosa pink and moist, NCAT Cardio: Reg rate Chest: normal effort, normal rate of breathing Abd: soft, non-distended Ext: no edema Psych: Pt's affect is appropriate. Pt is cooperative Skin: Clean and intact without signs of breakdown Neuro:  pt is alert and oriented to person, place. Left central 7, right gaze preference. Mild dysarthria. Poor concentration. LUE 1 to 1+/5. LLE 1+ to 2/5 HE, KE and 0/5 distally. Decreased LT left arm and leg. DTR's 3+ LLE and LUE with early extensor and flexor  tone respectively Musculoskeletal: Full ROM, No pain with AROM or PROM in the neck, trunk, or extremities. Posture appropriate     Assessment/Plan: 1. Functional deficits which require 3+ hours per day of interdisciplinary therapy in a comprehensive inpatient rehab setting. Physiatrist is providing close team supervision and 24 hour management of active medical problems listed below. Physiatrist and rehab team continue to assess barriers to discharge/monitor patient progress toward functional and medical goals  Care Tool:  Bathing  Bathing activity did not occur: Safety/medical concerns (no time/declined) Body parts bathed by patient: Right arm, Left arm, Chest, Abdomen, Front perineal area, Buttocks, Right upper leg, Left upper leg, Right lower leg, Left lower leg, Face         Bathing assist Assist Level: Moderate Assistance - Patient 50 - 74%     Upper Body Dressing/Undressing Upper body dressing   What is the patient wearing?: Pull over shirt    Upper body assist Assist Level: Moderate Assistance - Patient 50 - 74%    Lower Body Dressing/Undressing Lower body dressing      What is the patient wearing?: Pants     Lower body assist Assist for lower body dressing: Maximal Assistance - Patient 25 - 49%     Toileting Toileting Toileting  Activity did not occur (Clothing management and hygiene only): N/A (no void or bm)  Toileting assist Assist for toileting: Moderate Assistance - Patient 50 - 74%     Transfers Chair/bed transfer  Transfers assist     Chair/bed transfer assist level: Minimal Assistance - Patient > 75%     Locomotion Ambulation   Ambulation assist      Assist level: Moderate Assistance - Patient 50 - 74% Assistive device: Walker-rolling Max distance: 17ft   Walk 10 feet activity   Assist     Assist level: Moderate Assistance - Patient - 50 - 74% Assistive device: Hand held assist   Walk 50 feet activity   Assist Walk 50 feet  with 2 turns activity did not occur: Safety/medical concerns         Walk 150 feet activity   Assist Walk 150 feet activity did not occur: Safety/medical concerns         Walk 10 feet on uneven surface  activity   Assist Walk 10 feet on uneven surfaces activity did not occur: Safety/medical concerns         Wheelchair     Assist Is the patient using a wheelchair?: Yes Type of Wheelchair: Manual    Wheelchair assist level: Dependent - Patient 0% Max wheelchair distance: 150    Wheelchair 50 feet with 2 turns activity    Assist        Assist Level: Dependent - Patient 0%   Wheelchair 150 feet activity     Assist      Assist Level: Dependent - Patient 0%   Blood pressure 137/87, pulse 77, temperature 98.6 F (37 C), resp. rate 18, height 5\' 1"  (1.549 m), weight 44.2 kg, SpO2 97 %.  Medical Problem List and Plan: 1.  Left-sided weakness functional deficits secondary to right thalamic/basal ganglia/frontal temporal ICH in the setting of hypertension as well as cocaine use             -patient may shower             -ELOS/Goals: 18-24 days, min assist with PT, OT, sup/min SLP             -left WFO, PRAFO's ordered  Continue CIR  -discussed timeline of healing from ICH  TC scheduled with me 1/23 2.  Impaired mobility: continue Antiembolism stockings, thigh (TED hose) Bilateral lower extremities             -antiplatelet therapy: N/A 3.Headache: Increase topamax to 50mg  BID. Check CT head given worsening pain 4. Anxiety: Provide emotional support, Therapeutic rec consulted to provide information on bringing in her pet dogs for support.              -antipsychotic agents: N/A 5. Neuropsych: This patient is capable of making decisions on her own behalf. 6. Skin/Wound Care: Routine skin checks 7. Fluids/Electrolytes/Nutrition: encourage fluids  -check labs 12/26 8.  Hypertension.  d/c lisinopril.  Monitor with increased mobility 9.  Seizure  prophylaxis.  Keppra 500 mg twice daily 10.  Hyperlipidemia.  Statin held given ICH. 11.  History of polysubstance abuse.  Provide counseling 12.  History of COPD/tobacco use.  Monitor oxygen saturations every shift. 13. Constipation:             -good results with sorbitol overnight 14. Decreased appetite: edited diet order with her preferences. 15. Low protein levels: discussed good protein sources  16. Transaminitis: Patient may use on med NAC from home- placed order.  LOS: 5 days A FACE TO FACE EVALUATION WAS PERFORMED  Drema Pry Joziah Dollins 06/03/2021, 11:54 AM

## 2021-06-03 NOTE — Progress Notes (Signed)
Physical Therapy Session Note  Patient Details  Name: Monica Miranda MRN: [id number] Date of Birth: 11/21/1960  Today's Date: 06/03/2021 PT Individual Time: 1430-1455 PT Individual Time Calculation (min): 25 min   Short Term Goals: Week 1:  PT Short Term Goal 1 (Week 1): Patient will complete bed mobility with supervision PT Short Term Goal 2 (Week 1): Patient will complete sit <> stand with MinA consistently PT Short Term Goal 3 (Week 1): Patient will ambulate >20 ft with LRAD and MinA PT Short Term Goal 4 (Week 1): Patient will improve BBS by >/= 7pts  Skilled Therapeutic Interventions/Progress Updates:     Pt seen with family transferring pt from w/c to EOB - no update on safety plan regarding family assist for transfers and will need further ed on fall prevention and CIR policies. NT entering room for routine vitals. Pt c/o HA and reports that she's waiting on transport for CT head. Pt noted to be quite tangential and difficult to redirect. Pt agreeable to PT tx despite questioning this therapist's treatment plan. Sit<>stand to RW with miNA and completed stand<>pivot transfer with minA and RW to w/c. Unfortunately, transport then arriving to take pt off the floor for imaging and requested her back in bed for transport. Assisted to bed in similar manner and required minA for BLE management back to bed. Remained supine in bed with bed alarm on and direct handoff of care to transport.   Therapy Documentation Precautions:  Precautions Precautions: Fall Precaution Comments: BP <150, L hemi, L inattention Restrictions Weight Bearing Restrictions: No General:    Therapy/Group: Individual Therapy  Orrin Brigham 06/03/2021, 7:51 AM

## 2021-06-03 NOTE — Progress Notes (Signed)
Occupational Therapy Session Note  Patient Details  Name: Monica Miranda MRN: [id number] Date of Birth: 10/08/1960  Today's Date: 06/03/2021 OT Individual Time: 1300-1430 OT Individual Time Calculation (min): 90 min    Short Term Goals: Week 1:  OT Short Term Goal 1 (Week 1): Pt will don shirt using hemi techniques with min A OT Short Term Goal 2 (Week 1): Pt will complete 3/3 toileting tasks with min A OT Short Term Goal 3 (Week 1): Pt will complete toilet transfer with min A OT Short Term Goal 4 (Week 1): Pt will don pants with min A  Skilled Therapeutic Interventions/Progress Updates:  Pt greeted supine in bed agreeable to OT intervention. Session focus on BADL reeducation, functional mobility, dynamic standing balance and decreasing overall caregiver burden.pt completed supine>sit with light MIN A to elevate trunk into sitting. Pt completed ambulatory toilet transfer into bathroom with rw and MIN A, pt needed step by step cues to sequence gait pattern with Rw. Pt completed 3/3 toileting tasks with MIN A needing assist for clothing mgmt on L side, able to complete anterior pericare via lateral leans with set- up assist. Pt ambulated into shower with MIN A with RW and MOD multimodal cues for sequencing, Rw mgmt and overall body mechanics. Pt completed bathing from shower seat with overall MIN A needing assist for balance when standing to wash buttock d/t L lateral lean in standing, pt using LUE as gross assist during bathing and even able to actively reach to RUE to wash arm pit with LUE! Pt requested her daughter in law wash her hair. Pt exited shower with MIN HHA to pivot to w/c. Pt completed dressing from w/c with pt needing MOD A for UB dressing, pt tried to recall hemi techniques but needed cues to recall correct technique and fully thread LUE into sleeve. MOD A to don pants  most assist needed to pull up pants in standing. MAX A to don socks. Pt left seated at sink with her daughter blow  drying pts hair, NT aware                              Pt with one episode of feeling lightheaded on toilet with BP checked 132/94 ( 102)  Therapy Documentation Precautions:  Precautions Precautions: Fall Precaution Comments: BP <150, L hemi, L inattention Restrictions Weight Bearing Restrictions: No   Pain: no pain reported during session    Therapy/Group: Individual Therapy  Barron Schmid 06/03/2021, 3:44 PM

## 2021-06-03 NOTE — Progress Notes (Signed)
Pt continues to c/o headache and seeking medication after administration of PRN pain medication. Pt noted with some intermittent confusion and forgetfulness. Redirected and educated pt on pain medication parameters.

## 2021-06-03 NOTE — Progress Notes (Addendum)
Occupational Therapy Session Note  Patient Details        Make Up Session Name: Monica Miranda MRN: [id number] Date of Birth: 12/10/1960  Today's Date: 06/03/2021 OT Individual Time: 1856-3149 OT Individual Time Calculation (min): 38 min    Short Term Goals: Week 1:  OT Short Term Goal 1 (Week 1): Pt will don shirt using hemi techniques with min A OT Short Term Goal 2 (Week 1): Pt will complete 3/3 toileting tasks with min A OT Short Term Goal 3 (Week 1): Pt will complete toilet transfer with min A OT Short Term Goal 4 (Week 1): Pt will don pants with min A  Skilled Therapeutic Interventions/Progress Updates:    Pt in bed to start session with lights off secondary to headache.  She was agreeable to participate in therapy with transfer to sitting EOB with min assist.  She was able to sit EOB with supervision but became hyper-verbal throughout session with discussion of her different topics with therapist having to re-direct her to keep her attention on questions asked by therapist.  She reported not having her hand splint on the left hand for a few days, so therapist located it in the room, and re-adjusted it to fit better, with demonstration of how to put it on.  Then therapist had her assist with donning the splint and fastening the straps.  Handout provided over bed as well, for nursing to be aware that pt is to wear the splint every night.  She was able to demonstrate active movement in the arm and hand once the splint was removed.  Completed functional reach with mod assist to help pick up and hold her styrofoam cup so she could drink from the cup.  Next, had her ambulate over to the sink with min assist and use of the RW with hand splint on the left.  Mod demonstrational cueing for sequencing sit to stand and for placement of the LUE.  Mod instructional cueing also in standing to scan left of midline for locating washcloth to use for washing her face.  Noted increased left lean in standing  with left knee buckling while working on task.  Returned to the EOB bed at completion of grooming with min assist using the RW.  She transferred to supine with min guard assist to finish session.  OT provided pillows for positioning of the LUE while at rest with instructions to make sure nursing had pillows for her in the bed to position it after sessions.    Therapy Documentation Precautions:  Precautions Precautions: Fall Precaution Comments: BP <150, L hemi, L inattention Restrictions Weight Bearing Restrictions: No  Pain: Pain Assessment Pain Scale: Faces Pain Score: 0-No pain Faces Pain Scale: Hurts a little bit Pain Type: Chronic pain Pain Location: Head Pain Onset: On-going Pain Intervention(s): Repositioned ADL: See Care Tool Section for some details of mobility and selfcare   Therapy/Group: Individual Therapy  Decklyn Hornik OTR/L 06/03/2021, 12:43 PM

## 2021-06-04 MED ORDER — MAGNESIUM GLUCONATE 500 MG PO TABS
250.0000 mg | ORAL_TABLET | Freq: Every day | ORAL | Status: DC
  Administered 2021-06-04 – 2021-06-05 (×2): 250 mg via ORAL
  Filled 2021-06-04 (×2): qty 1

## 2021-06-04 MED ORDER — TOPIRAMATE 25 MG PO TABS
100.0000 mg | ORAL_TABLET | Freq: Two times a day (BID) | ORAL | Status: DC
  Administered 2021-06-04 – 2021-06-17 (×21): 100 mg via ORAL
  Filled 2021-06-04 (×23): qty 4

## 2021-06-04 MED ORDER — POLYETHYLENE GLYCOL 3350 17 G PO PACK
17.0000 g | PACK | Freq: Every day | ORAL | Status: DC
  Administered 2021-06-04 – 2021-06-07 (×4): 17 g via ORAL
  Filled 2021-06-04 (×11): qty 1

## 2021-06-04 MED ORDER — SORBITOL 70 % SOLN: 30.0000 mL | Freq: Every day | Status: DC | PRN

## 2021-06-04 NOTE — Progress Notes (Signed)
°   06/04/21 1130  Clinical Encounter Type  Visited With Patient  Visit Type Psychological support;Spiritual support;Initial  Referral From Nurse  Consult/Referral To Chaplain   Chaplain Tery Sanfilippo responded consult request for prayer. Patient request prayer for healing. This note was prepared by Deneen Harts, M.Div..  For questions please contact by phone [phone].

## 2021-06-04 NOTE — Progress Notes (Signed)
Speech Language Pathology Daily Session Note  Patient Details  Name: TAURUS WILLIS MRN: [id number] Date of Birth: 1961/03/28  Today's Date: 06/04/2021 SLP Individual Time: 1400-1500 SLP Individual Time Calculation (min): 60 min  Short Term Goals: Week 1: SLP Short Term Goal 1 (Week 1): Patient will demonstrate sustained attention to functional tasks for 10 minute duration with min A verbal redirection SLP Short Term Goal 2 (Week 1): Patient will utilize external memory aids to recall new, daily information with min A verbal cues SLP Short Term Goal 3 (Week 1): Patient will orient x4 with min A cues SLP Short Term Goal 4 (Week 1): Patient will complete complex problem solving tasks with min A verbal cues  Skilled Therapeutic Interventions: Skilled ST treatment focused on cognitive goals. Patient was verbose and required mod A verbal redirection for attention to task to sustain attention for 5 minute intervals. Patient wrote in memory notebook questions she wants to ask MD during rounds with min A verbal/visual cues. She required sup A cues for orientation to time when placing date at the top of paper. Patient verbally sequenced ADL/iADLs with min A to achieve 80% accuracy. Most cues were aimed to redirect attention due to hyperverbal nature and perseveration therefore benefits from a structured environment with limited opportunity for conversation. Patient was left in bed with alarm activated and immediate needs within reach at end of session. Continue per current plan of care.       Pain Pain Assessment Pain Scale: 0-10 Pain Score: 0-No pain  Therapy/Group: Individual Therapy  Willy Pinkerton T May Ozment 06/04/2021, 3:01 PM

## 2021-06-04 NOTE — Progress Notes (Signed)
Pt continues to c/o headache and seeking medication. Pt requested to see the hospital chaplain.Consult to spiritual care requested for pt. Pt noted with some intermittent confusion and forgetfulness. Redirected and educated pt on pain medication parameters.

## 2021-06-04 NOTE — Progress Notes (Signed)
Occupational Therapy Session Note  Patient Details  Name: Monica Miranda MRN: [id number] Date of Birth: [birth year]  Today's Date: 06/04/2021 OT Individual Time: 1015-1110 OT Individual Time Calculation (min): 55 min    Short Term Goals: Week 1:  OT Short Term Goal 1 (Week 1): Pt will don shirt using hemi techniques with min A OT Short Term Goal 2 (Week 1): Pt will complete 3/3 toileting tasks with min A OT Short Term Goal 3 (Week 1): Pt will complete toilet transfer with min A OT Short Term Goal 4 (Week 1): Pt will don pants with min A  Skilled Therapeutic Interventions/Progress Updates:    Pt resting in w/c upon arrival with daughter present. OT intervention with focus on attention to Lt, LUE NMR and functional reaching, stand pivot transfers, sitting balance, and activity tolerance to increase independence with BADLs. Pt hyperverbose throughout session and required max verbal cues for redirection. Pt perseverative during conversation, repeating 2-3 remarks consistently. Pt recalled several instructions from previous therapy sessions. Pt with active finger flexion/extension and elbow flexion/extension but required max verbal cues for sequencing when attempting to grasp bean bags. LUE NMR per below. Pt returned to room and agreed to remain in w/c. Belt alarm activated. All needs within reach and daughter present.   Therapy Documentation Precautions:  Precautions Precautions: Fall Precaution Comments: BP <150, L hemi, L inattention Restrictions Weight Bearing Restrictions: No   Pain: Pain Assessment Pain Scale: 0-10 Pain Score: 0-No pain   Other Treatments: Treatments Neuromuscular Facilitation: Left;Upper Extremity Weight Bearing Technique Weight Bearing Technique: Yes LUE Weight Bearing Technique: Forearm seated Response to Weight Bearing Technique: increased awareness and decreased temproray spastcity   Therapy/Group: Individual Therapy  Rich Brave 06/04/2021, 11:17 AM

## 2021-06-04 NOTE — Progress Notes (Signed)
Speech Language Pathology Daily Session Note Late Entry for 06/03/21 session  Patient Details  Name: Monica Miranda MRN: [id number] Date of Birth: 11-14-1960  Today's Date: 06/04/2021 SLP Individual Time: 1015-1100 SLP Individual Time Calculation (min): 45 min  Short Term Goals: Week 1: SLP Short Term Goal 1 (Week 1): Patient will demonstrate sustained attention to functional tasks for 10 minute duration with min A verbal redirection SLP Short Term Goal 2 (Week 1): Patient will utilize external memory aids to recall new, daily information with min A verbal cues SLP Short Term Goal 3 (Week 1): Patient will orient x4 with min A cues SLP Short Term Goal 4 (Week 1): Patient will complete complex problem solving tasks with min A verbal cues  Skilled Therapeutic Interventions:   Patient seen for skilled ST session focused on cognitive function goals. Patient awake, lying in bed and agreeable to session. She required frequent verbal and visual cues to redirect attention and reducing amount of items on her tray table did help somewhat with her distractibility. She was aware of basic reason for being in hospital and was able to recall some recent medical interventions and therapeutic interventions. She was impulsive and working memory was poor, for instance, forgetting she had her memory notebook only seconds after SLP presented it to her. Overall she exhibits significantly reduced attention, memory, organization, problem solving. She perseverated on topics but was pleasant overall and able to be redirected. She was left in bed with all needs within reach. She continues to benefit from skilled SLP intervention to maximize cognitive function prior to discharge.  Pain Pain Assessment Pain Scale: 0-10 Pain Score: 0-No pain  Therapy/Group: Individual Therapy  Angela Nevin, MA, CCC-SLP Speech Therapy
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
## 2021-06-04 NOTE — Progress Notes (Signed)
Physical Therapy Session Note  Patient Details  Name: Monica Miranda MRN: [id number] Date of Birth: May 12, 1961  Today's Date: 06/04/2021 PT Individual Time: 0900-0955 PT Individual Time Calculation (min): 55 min   Short Term Goals: Week 1:  PT Short Term Goal 1 (Week 1): Patient will complete bed mobility with supervision PT Short Term Goal 2 (Week 1): Patient will complete sit <> stand with MinA consistently PT Short Term Goal 3 (Week 1): Patient will ambulate >20 ft with LRAD and MinA PT Short Term Goal 4 (Week 1): Patient will improve BBS by >/= 7pts  Skilled Therapeutic Interventions/Progress Updates:     Pt sidelying in bed to start - agreeable to PT tx and reports a "bad headache.' Rest breaks, emotional support, and distraction provided for management. Pt also requesting to call cafeteria to order a 2nd breakfast tray as she's still hungry. Provided menu and encouraged self selection and calling herself to reduce learned helplessness and promote indep. She required more than reasonable time to locate breakfast options, select meal, and use house phone to call. Unable to read phone # on menu, unclear if visual field cut vs chronic deficits. Pt then completed supine<>sit with supervision with HOB raised. Doffed slipper socks and donned socks/shoes with totalA for time. Able to forward scoot at EOB with supervision. Completed sit<>Stand to RW with minA and stand<>pivot transfer to w/c with min/modA and RW. Pt verbalizing step-by-step sequencing, repeating herself frequently. Some motor apraxia noted while attempting to turn to sit to w/c, as she would attempt to turn the wrong direction and was unable to understand why. Transported to day room rehab gym for time in her w/c where she completed stand<>Pivot transfer with RW to mat table in similar manner. Instructed in gait training where she ambulated ~65ft with minA and RW but pt with significant decreased speed and had difficulty sustaining  attention to task - initially required max cues but attempted to fade cues to allow self corrections and self monitoring but pt had difficulty with this. Primary gait deficits include step-to gait, L foot inversion, decreased L foot clearance, downward gaze, forward flexed trunk. Pt returned to her room at conclusion of session. Remained seated in w/c with safety belt alarm on, all personal items and call bell within reach, LPN notified of pt's status.  *Pt quite tangential throughout session, requiring ++ time for completion of simple tasks and for redirection.   Therapy Documentation Precautions:  Precautions Precautions: Fall Precaution Comments: BP <150, L hemi, L inattention Restrictions Weight Bearing Restrictions: No General:    Therapy/Group: Individual Therapy  Orrin Brigham 06/04/2021, 7:39 AM

## 2021-06-04 NOTE — Progress Notes (Signed)
Physical Therapy Session Note  Patient Details  Name: Monica Miranda MRN: [id number] Date of Birth: 10/25/1960  Today's Date: 06/04/2021 PT Individual Time: 1501-1531 PT Individual Time Calculation (min): 30 min   Short Term Goals: Week 1:  PT Short Term Goal 1 (Week 1): Patient will complete bed mobility with supervision PT Short Term Goal 2 (Week 1): Patient will complete sit <> stand with MinA consistently PT Short Term Goal 3 (Week 1): Patient will ambulate >20 ft with LRAD and MinA PT Short Term Goal 4 (Week 1): Patient will improve BBS by >/= 7pts  Skilled Therapeutic Interventions/Progress Updates:     Pt received supine in bed and agrees to therapy. No complaint of pain. Supine to sit with bed features and verbal cues on sequencing and positioning. PT assists with donning shoes. Pt performs sit to stand to RW with L hand splint with minA and cues for hand position and body mechanics. Pt ambulates x80' with PT providing minA/modA, with verbal and tactile cues to maintain upright gaze to improve posture and balance, maintaining symmetrical and reciprocal stride length, maintaining adequate BOS due to pt tendency to scissor with L lower extremity, and additional tactile cueing at L hip to ensure adequate glute activation hip abduction and extension. Following seated rest break, pt ambulates additional 68' with same assist level and cueing. Supine to sit with minA. Left supine with alarm intact and all needs within reach.  Therapy Documentation Precautions:  Precautions Precautions: Fall Precaution Comments: BP <150, L hemi, L inattention Restrictions Weight Bearing Restrictions: No    Therapy/Group: Individual Therapy  Beau Fanny, PT, DPT 06/04/2021, 3:38 PM

## 2021-06-05 ENCOUNTER — Inpatient Hospital Stay (HOSPITAL_COMMUNITY): Payer: Medicaid Other
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
## 2021-06-05 MED ORDER — MAGNESIUM GLUCONATE 500 MG PO TABS
250.0000 mg | ORAL_TABLET | Freq: Two times a day (BID) | ORAL | Status: DC
  Administered 2021-06-05 – 2021-06-07 (×4): 250 mg via ORAL
  Filled 2021-06-05 (×6): qty 1

## 2021-06-05 NOTE — Progress Notes (Signed)
PROGRESS NOTE   Subjective/Complaints: Headache 10/10 last night, 9/10 this morning, but she feels NAC is helping. She is sitting upright at edge of bed today, I have turned off lights in room to help with migraine  ROS: Patient denies fever, rash, sore throat, blurred vision, nausea, vomiting, diarrhea, cough, shortness of breath, +headache, +constipation, +photosensitivity   Objective:   CT HEAD WO CONTRAST ( )  Result Date: 06/03/2021 CLINICAL DATA:  Headaches, recent intracerebral hematoma EXAM: CT HEAD WITHOUT CONTRAST TECHNIQUE: Contiguous axial images were obtained from the base of the skull through the vertex without intravenous contrast. COMPARISON:  05/25/2021 FINDINGS: Brain: There is 2.9 x 2.3 cm intracerebral hematoma in the right or frontotemporal with surrounding edema. This hematoma measured 4.1 cm in maximum diameter in the previous study. There is interval decrease in degree of mass effect. In the current study, there is approximately 4 mm shift of midline structures to the left. In the previous CT, there was 11 mm shift to left. There is mild effacement of cortical sulci in right cerebral hemisphere with interval improvement. There is 11 mm smooth marginated fluid density lesion in the right thalamus. Cisterna magna is prominent in size. Vascular: There are scattered arterial calcifications. Skull: Unremarkable. Sinuses/Orbits: Unremarkable. Other: None IMPRESSION: There is 2.9 x 2.3 cm intracerebral hematoma in the right frontotemporal region with surrounding edema. There is decrease in size of intra cerebral hematoma in this region. There are no new foci of bleeding within the cranium. There is approximately 4 mm shift of midline structures to the left caused by the right intraparenchymal hematoma with interval improvement. There is 11 mm fluid density structure in the right thalamus suggesting lacunar infarct.  Electronically Signed   By: Ernie Avena M.D.   On: 06/03/2021 15:25   No results for input(s): WBC, HGB, HCT, PLT in the last 72 hours.  No results for input(s): NA, K, CL, CO2, GLUCOSE, BUN, CREATININE, CALCIUM in the last 72 hours.   Intake/Output Summary (Last 24 hours) at 06/05/2021 1320 Last data filed at 06/05/2021 0730 Gross per 24 hour  Intake 220 ml  Output --  Net 220 ml     Pressure Injury 05/25/21 Sacrum Medial;Lower Stage 1 -  Intact skin with non-blanchable redness of a localized area usually over a bony prominence. (Active)  05/25/21 2115  Location: Sacrum  Location Orientation: Medial;Lower  Staging: Stage 1 -  Intact skin with non-blanchable redness of a localized area usually over a bony prominence.  Wound Description (Comments):   Present on Admission: Yes    Physical Exam: Vital Signs Blood pressure 123/82, pulse 64, temperature 97.8 F (36.6 C), resp. rate 18, height 5\' 1"  (1.549 m), weight 44.2 kg, SpO2 98 %. Gen: no distress, normal appearing, sitting upright and appears to be in less pain HEENT: oral mucosa pink and moist, NCAT Cardio: Reg rate Chest: normal effort, normal rate of breathing Abd: soft, non-distended Ext: no edema Psych: Pt's affect is appropriate. Pt is cooperative Skin: Clean and intact without signs of breakdown Neuro:  pt is alert and oriented to person, place. Left central 7, right gaze preference. Mild dysarthria. Poor concentration. LUE 1 to 1+/5.  LLE 1+ to 2/5 HE, KE and 0/5 distally. Decreased LT left arm and leg. DTR's 3+ LLE and LUE with early extensor and flexor tone respectively Musculoskeletal: Full ROM, No pain with AROM or PROM in the neck, trunk, or extremities. Posture appropriate     Assessment/Plan: 1. Functional deficits which require 3+ hours per day of interdisciplinary therapy in a comprehensive inpatient rehab setting. Physiatrist is providing close team supervision and 24 hour management of active  medical problems listed below. Physiatrist and rehab team continue to assess barriers to discharge/monitor patient progress toward functional and medical goals  Care Tool:  Bathing  Bathing activity did not occur: Safety/medical concerns (no time/declined) Body parts bathed by patient: Right arm, Left arm, Chest, Abdomen, Front perineal area, Buttocks, Right upper leg, Left upper leg, Right lower leg, Left lower leg, Face         Bathing assist Assist Level: Minimal Assistance - Patient > 75%     Upper Body Dressing/Undressing Upper body dressing   What is the patient wearing?: Pull over shirt    Upper body assist Assist Level: Moderate Assistance - Patient 50 - 74%    Lower Body Dressing/Undressing Lower body dressing      What is the patient wearing?: Pants     Lower body assist Assist for lower body dressing: Moderate Assistance - Patient 50 - 74%     Toileting Toileting Toileting Activity did not occur Press photographer and hygiene only): N/A (no void or bm)  Toileting assist Assist for toileting: Minimal Assistance - Patient > 75%     Transfers Chair/bed transfer  Transfers assist     Chair/bed transfer assist level: Minimal Assistance - Patient > 75%     Locomotion Ambulation   Ambulation assist      Assist level: Minimal Assistance - Patient > 75% Assistive device: Walker-rolling Max distance: 47ft   Walk 10 feet activity   Assist     Assist level: Moderate Assistance - Patient - 50 - 74% Assistive device: Hand held assist   Walk 50 feet activity   Assist Walk 50 feet with 2 turns activity did not occur: Safety/medical concerns         Walk 150 feet activity   Assist Walk 150 feet activity did not occur: Safety/medical concerns         Walk 10 feet on uneven surface  activity   Assist Walk 10 feet on uneven surfaces activity did not occur: Safety/medical concerns         Wheelchair     Assist Is the patient  using a wheelchair?: Yes Type of Wheelchair: Manual    Wheelchair assist level: Dependent - Patient 0% Max wheelchair distance: 150    Wheelchair 50 feet with 2 turns activity    Assist        Assist Level: Dependent - Patient 0%   Wheelchair 150 feet activity     Assist      Assist Level: Dependent - Patient 0%   Blood pressure 123/82, pulse 64, temperature 97.8 F (36.6 C), resp. rate 18, height 5\' 1"  (1.549 m), weight 44.2 kg, SpO2 98 %.  Medical Problem List and Plan: 1.  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  Impaired mobility: continue Antiembolism stockings, thigh (TED hose) Bilateral lower extremities             -antiplatelet therapy: N/A 3.Headache: Increase topamax to 100mg  BID. CT head stable- discussed with patient.  4. Anxiety: Provide emotional support, Therapeutic rec consulted to provide information on bringing in her pet dogs for support.              -antipsychotic agents: N/A 5. Neuropsych: This patient is capable of making decisions on her own behalf. 6. Skin/Wound Care: Routine skin checks 7. Fluids/Electrolytes/Nutrition: encourage fluids  -check labs 12/26 8.  Hypertension.  d/c lisinopril.  Monitor with increased mobility 9.  Seizure prophylaxis.  Keppra 500 mg twice daily 10.  Hyperlipidemia.  Statin held given ICH. 11.  History of polysubstance abuse.  Provide counseling 12.  History of COPD/tobacco use.  Monitor oxygen saturations every shift. 13. Constipation:             -good results with sorbitol overnight 14. Decreased appetite: edited  diet order with her preferences. 15. Low protein levels: discussed good protein sources  16. Transaminitis: Patient may use on med NAC from home- placed order.  17. Constipation: increase magnesium gluconate to 250mg  BID 18. Right rib pain: rib XR ordered.     LOS: 7 days A FACE TO FACE EVALUATION WAS PERFORMED  Leeandre Nordling P Tevin Shillingford 06/05/2021, 1:20 PM

## 2021-06-05 NOTE — Progress Notes (Signed)
Speech Language Pathology Daily Session Note  Patient Details  Name: KATALEYAH CARDUCCI MRN: [id number] Date of Birth: [birth year]  Today's Date: 06/05/2021 SLP Individual Time: 1016-1100 SLP Individual Time Calculation (min): 44 min  Short Term Goals: Week 1: SLP Short Term Goal 1 (Week 1): Patient will demonstrate sustained attention to functional tasks for 10 minute duration with min A verbal redirection SLP Short Term Goal 2 (Week 1): Patient will utilize external memory aids to recall new, daily information with min A verbal cues SLP Short Term Goal 3 (Week 1): Patient will orient x4 with min A cues SLP Short Term Goal 4 (Week 1): Patient will complete complex problem solving tasks with min A verbal cues  Skilled Therapeutic Interventions:  Pt seen for skilled ST with focus on cognitive goals. Upon entry, pt room extremely dark and pt was speaking with MD, utilizing memory notebook to write down important information during conversation. Pt continues to endorse headaches and some complaints with staff, SLP providing appropriate comfort and re-direction. Pt oriented x4 with use of external aids provided supervision A. Pt demonstrates improved sustained attention to task this date benefiting from overall min A cues, remains mildly verbose. Pt with increased awareness of deficits following CVA however remains with poor insight into how these will impact daily life (states she wants to fly to see her significant other in [place] "as soon as she leaves here"). Pt left EOB waiting for NT, bed alarm set and all needs within reach. Cont ST POC.   Pain Pain Assessment Pain Scale: 0-10 Pain Score: 6  Pain Type: Chronic pain Pain Location: Head Pain Orientation: Anterior Pain Descriptors / Indicators: Aching Pain Frequency: Intermittent Pain Onset: On-going Patients Stated Pain Goal: 3 Pain Intervention(s): Repositioned  Therapy/Group: Individual Therapy  Tacey Ruiz 06/05/2021, 12:13  PM

## 2021-06-06 MED ORDER — MUSCLE RUB 10-15 % EX CREA
TOPICAL_CREAM | CUTANEOUS | Status: DC | PRN
  Filled 2021-06-06: qty 85

## 2021-06-06 MED ORDER — LEVETIRACETAM 250 MG PO TABS
250.0000 mg | ORAL_TABLET | Freq: Two times a day (BID) | ORAL | Status: DC
  Administered 2021-06-06 – 2021-06-08 (×4): 250 mg via ORAL
  Filled 2021-06-06 (×4): qty 1

## 2021-06-06 NOTE — Plan of Care (Signed)
°  Problem: Consults Goal: RH STROKE PATIENT EDUCATION Description: See Patient Education module for education specifics  Outcome: Progressing   Problem: RH BOWEL ELIMINATION Goal: RH STG MANAGE BOWEL WITH ASSISTANCE Description: STG Manage Bowel with  mod I Assistance. Outcome: Progressing Goal: RH STG MANAGE BOWEL W/MEDICATION W/ASSISTANCE Description: STG Manage Bowel with Medication with mod I Assistance. Outcome: Progressing   Problem: RH SKIN INTEGRITY Goal: RH STG SKIN FREE OF INFECTION/BREAKDOWN Description: Patient and son/DIL will be able to manage care at discharge using handouts and educational resources with min assist Outcome: Progressing Goal: RH STG MAINTAIN SKIN INTEGRITY WITH ASSISTANCE Description: STG Maintain Skin Integrity With min Assistance. Outcome: Progressing   Problem: RH SAFETY Goal: RH STG ADHERE TO SAFETY PRECAUTIONS W/ASSISTANCE/DEVICE Description: STG Adhere to Safety Precautions With cues /Assistance/Device. Outcome: Progressing   Problem: RH PAIN MANAGEMENT Goal: RH STG PAIN MANAGED AT OR BELOW PT'S PAIN GOAL Description: At or below level 4 with prn meds Outcome: Progressing   Problem: RH KNOWLEDGE DEFICIT Goal: RH STG INCREASE KNOWLEDGE OF HYPERTENSION Description: Patient and son/DIL will be able to manage HTN with medications and dietary modifications at discharge using handouts and educational resources independently Outcome: Progressing Goal: RH STG INCREASE KNOWLEGDE OF HYPERLIPIDEMIA Description: Patient and son/DIL will be able to manage HLD with medications and dietary modifications at discharge using handouts and educational resources independently Outcome: Progressing Goal: RH STG INCREASE KNOWLEDGE OF STROKE PROPHYLAXIS Description: Patient and son/DIL will be able to manage secondary stroke risks at discharge using handouts and educational resources independently Outcome: Progressing   Problem: RH Vision Goal: RH LTG Vision  (Specify) Outcome: Progressing
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
## 2021-06-06 NOTE — Progress Notes (Signed)
Physical Therapy Session Note  Patient Details  Name: Monica Miranda MRN: [id number] Date of Birth: 1961/04/20  Today's Date: 06/06/2021 PT Individual Time: 1400-1500 PT Individual Time Calculation (min): 60 min   Short Term Goals: Week 1:  PT Short Term Goal 1 (Week 1): Patient will complete bed mobility with supervision PT Short Term Goal 2 (Week 1): Patient will complete sit <> stand with MinA consistently PT Short Term Goal 3 (Week 1): Patient will ambulate >20 ft with LRAD and MinA PT Short Term Goal 4 (Week 1): Patient will improve BBS by >/= 7pts  Skilled Therapeutic Interventions/Progress Updates:     Pt supine in bed to start with her daughter at bedside preparing to leave. Pt agreeable to PT tx and reports a "migraine." Treatment to tolerance but avoided pain questions to reduce pain focused behaviors.   Supine<>sit with supervision with HOB raised. Donned slip-on shoes with assist for time. Completed sit<>stand to RW with CGA and stand<>pivot transfer with RW and minA to w/c. Pt verbalizing step-by-step sequencing for transfer and this takes + time for simple tasks.   Transported in w/c to main rehab gym for time. Completed sit<>stand transfer to RW from w/c with minA and then worked on functional gait training where she ambulated ~15ft + ~74ft with min/modA and RW. Required mod verbal cueing for gait sequencing and also instructed pt to focus on her gait pattern to work on self monitoring and self corrections. Her primary gait deficits include L foot inversion (may benefit from an aircast?), forward flexed trunk, adduction of LLE, and decreased gait speed for age. She responds well to verbal cueing but takes cues very literally and requires explanation.   Next, worked on dynamic standing balance with unilateral toe taps (LLE) to 6inch platform with NO UE support and min (quickly fading to modA) assist for balance, 2x15 reps each. Pt with some motor apraxia during this task and some  L inattention that impacted awareness. Pt returned to her room in w/c and assisted back to bed via stand<>pivot transfer, requiring modA while not using RW. Completed bed mobility with supervision. Remained in bed with bed alarm on and all needs in reach at conclusion of session.   She remains very tangential in speech, hyperverbose, and difficult to redirect at times. When she's distracted or being tangential, she struggles with multi-tasking and completing simple functional mobility tasks such as stand<>pivot transfers.   Therapy Documentation Precautions:  Precautions Precautions: Fall Precaution Comments: BP <150, L hemi, L inattention Restrictions Weight Bearing Restrictions: No General:    Therapy/Group: Individual Therapy  Monica Miranda [name] 06/06/2021, 7:45 AM

## 2021-06-06 NOTE — Progress Notes (Signed)
Occupational Therapy Session Note  Patient Details  Name: Monica Miranda MRN: [id number] Date of Birth: 1961/05/19  Today's Date: 06/06/2021 OT Individual Time: 6269-4854 OT Individual Time Calculation (min): 58 min    Short Term Goals: Week 1:  OT Short Term Goal 1 (Week 1): Pt will don shirt using hemi techniques with min A OT Short Term Goal 2 (Week 1): Pt will complete 3/3 toileting tasks with min A OT Short Term Goal 3 (Week 1): Pt will complete toilet transfer with min A OT Short Term Goal 4 (Week 1): Pt will don pants with min A  Skilled Therapeutic Interventions/Progress Updates:  Pt greeted supine in bed  agreeable to OT intervention. Session focus on BADL reeducation, functional mobility, dynamic standing balance and decreasing overall caregiver burden.  Pt completed supine>sit using bed features MOD I. Pt completed ambulatory shower transfer from EOB >walkin shower with MIN A with RW. Pt needed step by step cues to sequence gait pattern RW mgmt.  Pt completed bathing with overall MIN A needing balance support when standing to wash buttock. Pt needed MOD verbal/tactile cues to recall incorporating LUE into bathing, pt may benefit from introduction of bath mit next session. Pt request to don lotion from shower, encouraged gross grasp with LUE to hold/ squeeze lotion with pt needing MIN A to fully squeeze lotion out of bottle. Pt exited shower with MIN A via stand pivot with HHA. Pt completed dressing from w/c in bathroom with pt needing MINA for UB dressing with OH shirt, improved recall of hemi techniques this session. Pt able to don pants/underwear with MIN A needing assist only to pull pants up to waist line on L side. Pt needed MOD A to don socks needing assist to don sock on L foot. pt left seated in w/c with NT present assisting pt with finishing up ADL routine. /                       Therapy Documentation Precautions:  Precautions Precautions: Fall Precaution Comments: BP  <150, L hemi, L inattention Restrictions Weight Bearing Restrictions: No  Pain: intermittent reports of pain from HA, rest breaks provided as needed.     Therapy/Group: Individual Therapy  Barron Schmid 06/06/2021, 12:13 PM

## 2021-06-07 LAB — HEPATIC FUNCTION PANEL
ALT: 63 U/L — ABNORMAL HIGH (ref 0–44)
AST: 31 U/L (ref 15–41)
Albumin: 3.1 g/dL — ABNORMAL LOW (ref 3.5–5.0)
Alkaline Phosphatase: 136 U/L — ABNORMAL HIGH (ref 38–126)
Bilirubin, Direct: <0.1 mg/dL (ref 0.0–0.2)
Total Bilirubin: 0.5 mg/dL (ref 0.3–1.2)
Total Protein: 5.5 g/dL — ABNORMAL LOW (ref 6.5–8.1)

## 2021-06-07 LAB — CBC
HCT: 38.8 % (ref 36.0–46.0)
Hemoglobin: 12.5 g/dL (ref 12.0–15.0)
MCH: 28.3 pg (ref 26.0–34.0)
MCHC: 32.2 g/dL (ref 30.0–36.0)
MCV: 87.8 fL (ref 80.0–100.0)
Platelets: 302 10*3/uL (ref 150–400)
RBC: 4.42 MIL/uL (ref 3.87–5.11)
RDW: 16.1 % — ABNORMAL HIGH (ref 11.5–15.5)
WBC: 4.7 10*3/uL (ref 4.0–10.5)
nRBC: 0 % (ref 0.0–0.2)

## 2021-06-07 LAB — MAGNESIUM: Magnesium: 2.2 mg/dL (ref 1.7–2.4)

## 2021-06-07 LAB — BASIC METABOLIC PANEL
Anion gap: 6 (ref 5–15)
BUN: 21 mg/dL (ref 8–23)
CO2: 24 mmol/L (ref 22–32)
Calcium: 9.1 mg/dL (ref 8.9–10.3)
Chloride: 109 mmol/L (ref 98–111)
Creatinine, Ser: 0.61 mg/dL (ref 0.44–1.00)
GFR, Estimated: >60 mL/min (ref >60)
Glucose, Bld: 92 mg/dL (ref 70–99)
Potassium: 3.7 mmol/L (ref 3.5–5.1)
Sodium: 139 mmol/L (ref 135–145)

## 2021-06-07 LAB — VITAMIN D 25 HYDROXY (VIT D DEFICIENCY, FRACTURES): Vit D, 25-Hydroxy: 27.24 ng/mL — ABNORMAL LOW (ref 30–100)

## 2021-06-07 MED ORDER — DIPHENHYDRAMINE HCL 12.5 MG/5ML PO ELIX
12.5000 mg | ORAL_SOLUTION | Freq: Once | ORAL | Status: AC
  Administered 2021-06-07: 12.5 mg via ORAL
  Filled 2021-06-07: qty 10

## 2021-06-07 MED ORDER — DIPHENHYDRAMINE HCL 12.5 MG/5ML PO ELIX
12.5000 mg | ORAL_SOLUTION | Freq: Every day | ORAL | Status: DC | PRN
  Administered 2021-06-08: 12.5 mg via ORAL
  Filled 2021-06-07: qty 10

## 2021-06-07 MED ORDER — PREDNISONE 10 MG PO TABS
5.0000 mg | ORAL_TABLET | Freq: Once | ORAL | Status: AC
  Administered 2021-06-07: 5 mg via ORAL
  Filled 2021-06-07: qty 1

## 2021-06-07 NOTE — Progress Notes (Signed)
Physical Therapy Session Note  Patient Details  Name: Monica Miranda MRN: [id number] Date of Birth: Mar 25, 1961  Today's Date: 06/07/2021 PT Individual Time: 1445-1525 PT Individual Time Calculation (min): 40 min   Short Term Goals: Week 1:  PT Short Term Goal 1 (Week 1): Patient will complete bed mobility with supervision PT Short Term Goal 1 - Progress (Week 1): Met PT Short Term Goal 2 (Week 1): Patient will complete sit <> stand with MinA consistently PT Short Term Goal 2 - Progress (Week 1): Met PT Short Term Goal 3 (Week 1): Patient will ambulate >20 ft with LRAD and MinA PT Short Term Goal 3 - Progress (Week 1): Met PT Short Term Goal 4 (Week 1): Patient will improve BBS by >/= 7pts PT Short Term Goal 4 - Progress (Week 1): Not met  Skilled Therapeutic Interventions/Progress Updates:      Direct handoff of care from OT to start. Pt supine in bed and appears to have improved migraine symptoms. Deferred pain questions to avoid pain focused behaviors. Supine<>sit with supervision. Shoes donned with totalA for time. Stand<>pivot transfer with minA and RW with mod directional cues due to apraxia and difficulty locating w/c on her L. Transported in w/c to main rehab gym for time. Directed in gait training with RW where she required minA and mod instructional cues for gait sequencing for ambulating 2x47f (seated rest). Pt remains hyperverbose and verbalizing step-by-step sequencing for all mobility tasks. Pt then instructed in dynamic balance task to challenge balance, righting reactions, hand/eye coordination, visual field cut, and reaching outside BOS. She was instructed in rebounder task which required minA for balance. She used her stronger RUE to toss ball and to catch. VC for monitoring postural control/awareness to reduce forward flexed trunk and LOB while reaching for ball - responds well to verbal cues but takes them very literally. Pt transported back to her room and she requested to  remain seated in w/c. Safety belt alarm applied, all immediate needs within reach.   Therapy Documentation Precautions:  Precautions Precautions: Fall Precaution Comments: BP <150, L hemi, L inattention Restrictions Weight Bearing Restrictions: No General: PT Amount of Missed Time (min): 60 Minutes PT Missed Treatment Reason: Patient unwilling to participate;Patient ill (Comment) ("migraine")  Therapy/Group: Individual Therapy  [name] 06/07/2021, 3:30 PM

## 2021-06-07 NOTE — Progress Notes (Signed)
Physical Therapy Weekly Progress Note  Patient Details  Name: Monica Miranda MRN: [id number] Date of Birth: [birth year]  Beginning of progress report period: May 31, 2021 End of progress report period: June 08, 2020  Patient has met 3 of 4 short term goals.  Pt making appropriate progress towards LTG this reporting period. Overall, she can complete bed mobility with supervision, sit<>stand transfers with minA and RW, and stand<>pivot transfers with minA and RW. She is able to ambulate ~3f with min/modA (minA for first ~424f and RW. Her performance fluctuates depending on fatigue and attention. She is highly distractible, very tangential and hyperverbose. Anticipate need for family education/training prior to DC to ensure safe DC home. DC date set for 06/17/21.  Patient continues to demonstrate the following deficits muscle weakness and muscle joint tightness, decreased cardiorespiratoy endurance, abnormal tone, unbalanced muscle activation, motor apraxia, decreased coordination, and decreased motor planning, decreased visual perceptual skills, decreased attention to left, left side neglect, and decreased motor planning, decreased initiation, decreased attention, decreased awareness, decreased problem solving, decreased safety awareness, decreased memory, and delayed processing, and decreased standing balance, decreased postural control, hemiplegia, and decreased balance strategies and therefore will continue to benefit from skilled PT intervention to increase functional independence with mobility.  Patient progressing toward long term goals..  Continue plan of care.  PT Short Term Goals Week 2:  PT Short Term Goal 1 (Week 2): STG = LTG due to ELOS  Skilled Therapeutic Interventions/Progress Updates:     Pt found slumped sitting in w/c with BLE propped on bed, sleeping soundly. Awakens to voice. Education provided on safety awareness and to avoid positions such as these to reduce her falls  risk. Question carryover but anticipate continued need for safety belt alarm system. Pt able to scoot herself back in w/c with minA and min instructional cues. Transported to main rehab gym for time and completed stand<>pivot transfer with minA and RW to mat table. Instructed in gait training ~12546f ~57f8fth RW and min/modA. Gait speed fluctuating and pt hyperverbose on gait sequencing, verbalizing step-by-step tasks. VC for reducing these and focusing on internal corrections. Continues to demonstrate L foot inversion in stance and initial contact - messaged MD for aircast to assist with this. Pt c/o vision deficits related to her stroke. Focused remainder of session on NMR with BITS system - user paced target reaching, geoboard duplication, and task sequencing with #1-#25. Tasks completed in standing with minA for balance due to L lean. Required VC for monitoring L foot to reduce risk of ankle inversion. Tasks also incorporated reaching outside BOS, visual scanning to L, task sequencing, standing tolerance, and promote LLE weight bearing for proprioceptive training. She lacked some error awareness with L sided visual deficits but does well with compensating with cervical rotation. Pt returned to her room and assisted back to bed via stand<>pivot transfer with minA and use of bed rail. Able to complete bed mobility with supervision and remained in bed at end of session with bed alarm on and all needs in reach. Family at bedside. *pt very tangential, distractible, and hyperverbose throughout the session. Requires + time for simple task completion despite redirection.   Therapy Documentation Precautions:  Precautions Precautions: Fall Precaution Comments: BP <150, L hemi, L inattention Restrictions Weight Bearing Restrictions: No General:    Therapy/Group: Individual Therapy  Guillermo Difrancesco P Amaia Lavallie PT 06/07/2021, 7:40 AM

## 2021-06-07 NOTE — Progress Notes (Signed)
Physical Therapy Session Note  Patient Details  Name: Monica Miranda MRN: [id number] Date of Birth: 01-08-1961  Today's Date: 06/07/2021 PT Missed Time: 60 Minutes Missed Time Reason: Patient unwilling to participate;Patient ill (Comment) ("migraine")  Skilled Therapeutic Interventions/Progress Updates:     Pt sleeping soundly in bed with lights off and blanket over her head. When woken, she refuses therapy due to "migraine" that she's been having all day. Pt missed 60 minutes of skilled therapy. Will re-attempt as schedule permits.   Therapy Documentation Precautions:  Precautions Precautions: Fall Precaution Comments: BP <150, L hemi, L inattention Restrictions Weight Bearing Restrictions: No General: PT Amount of Missed Time (min): 60 Minutes PT Missed Treatment Reason: Patient unwilling to participate;Patient ill (Comment) ("migraine")   Therapy/Group: Individual Therapy  Ezella Kell P Joshua Soulier 06/07/2021, 1:21 PM

## 2021-06-07 NOTE — Progress Notes (Signed)
Occupational Therapy Session Note  Patient Details  Name: Monica Miranda MRN: [id number] Date of Birth: March 10, 1961  Today's Date: 06/07/2021 OT Individual Time: 1120-1203 OT Individual Time Calculation (min): 43 min    Short Term Goals: Week 1:  OT Short Term Goal 1 (Week 1): Pt will don shirt using hemi techniques with min A OT Short Term Goal 2 (Week 1): Pt will complete 3/3 toileting tasks with min A OT Short Term Goal 3 (Week 1): Pt will complete toilet transfer with min A OT Short Term Goal 4 (Week 1): Pt will don pants with min A  Skilled Therapeutic Interventions/Progress Updates:  Skilled OT intervention completed with focus on functional transfers, activity tolerance, dynamic balance. Pt received side-lying in bed, asleep with daughter in room, pt easily woken. Pt initially reporting migraine, however reported she received meds this AM and it helped and was now agreeable to therapy. Pt doffed L hand splint with supervision. Pt requiring increased time throughout session between transitions, with increased distractibility and re-directive cues needed for task completion. Pt completed bed mobility with using bed rail assist with supervision. Sit > stand using RW with L hand splint, with min A, then min A stand pivot to w/c. Pt demonstrating carryover of previous therapy education about prepping to stand, as pt verbally cued herself with each step ("fix lefty", "both feet on ground", "get lefty in the hook"). Transported pt in w/c to therapy gym with total A for energy conservation. Sit > stand using RW, then stand pivot to EOM with min A. Pt participated in functional horse shoe task for dynamic balance, mass practice sit <> stands and midline orientation using mirror for visual feedback. Pt able to retrieve 8 horse shoes on mat then transfer to top of mirror via sit <> stand using RW and min A. Education provided about placing horse shoe on RW vs holding when standing for safety. L knee  flexion noted when in stance, with therapist applying facilitory cue for knee extension, as well as verbal cues to have pt weight shift > R due to heavy L lean. Pt able to recognize being off center, and able to re-correct later in session with CGA for balance. Pt report of minor dizziness after several stands, with intermittent rest break needed however pt insistent on continuing activity and able to doff all horse shoes in stance while using RW with CGA-min A. Pt expressing fatigue with increased assist needed for sequencing gait for stand pivot > w/c with min-mod A. Pt left seated EOB in room, with NT in room to assist pt back to bed and set up for lunch at end of OT session.   Therapy Documentation Precautions:  Precautions Precautions: Fall Precaution Comments: BP <150, L hemi, L inattention Restrictions Weight Bearing Restrictions: No  Pain: Migraine, unrated, pre-medicated   Therapy/Group: Individual Therapy  Delaynee Alred E Shane Melby 06/07/2021, 7:57 AM

## 2021-06-07 NOTE — Progress Notes (Signed)
SLP Cancellation Note  Patient Details Name: Monica Miranda MRN: [id number] DOB: 01/26/1961   Cancelled treatment:  Pt is complaining of severe migraine at this time, room dark and head under blankets when SLP entered. Pt reports she "refused occupational" this morning 2' migraine and politely refuses ST activities at this time despite encouragement. Pt missed 45 minutes skilled ST 2' migraine. Cont ST POC.    Tacey Ruiz 06/07/2021, 10:01 AM
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
## 2021-06-07 NOTE — Progress Notes (Addendum)
Occupational Therapy Note  Patient Details  Name: Monica Miranda MRN: [id number] Date of Birth: [birth year]  Today's Date: 06/07/2021   Pt left sidelying in bed asleep with lights off and blanket over her head. Pt woke with minimal cues however refused OT due to pain from a migraine.  She reports she received Tramadol this AM but still in too much pain.  Nurse made aware and arrived to provide medication. Will re-attempt as schedule permits.    Dian Situ Swati Granberry 06/07/2021, 1:37 PM

## 2021-06-07 NOTE — Progress Notes (Signed)
Occupational Therapy Session Note  Patient Details  Name: Monica Miranda MRN: [id number] Date of Birth: 01-26-1961  Today's Date: 06/07/2021 OT Individual Time: 1345-1445 OT Individual Time Calculation (min): 60 min    Short Term Goals: Week 1:  OT Short Term Goal 1 (Week 1): Pt will don shirt using hemi techniques with min A OT Short Term Goal 2 (Week 1): Pt will complete 3/3 toileting tasks with min A OT Short Term Goal 3 (Week 1): Pt will complete toilet transfer with min A OT Short Term Goal 4 (Week 1): Pt will don pants with min A  Skilled Therapeutic Interventions/Progress Updates:    Pt sitting EOB with brother present, lights now on, pt requesting to take a shower. Pt stating that she took a shower this AM with another therapist, however therapist does not see this occurrence in chart review, so question pt's recall/orientation.  Pt also reports she gets her days and nights mixed up a lot and asking what time it is now.  OT reoriented pt to time of day.  Pt completed sit to stand and ambulated slowly to bathroom using RW with close supervision.  Pt needed step by step multimodal cues to approach shower chair at walkin shower and complete stand to sit and seated swivel.  Pt completed UB bathing with supervision and LB bathing with CGA.  Upon exiting shower pt had one mild LOB needing min assist to recover.  Ambulated back to EOB with cues and CGA using RW.  Donned shirt with mod assist for time mgt primarily and pt able to verbally recall hemi technique but internally distracted and needing redirection.  Pt required mod assist for LB dressing as well for brief management and staying on task.  Pt returned to supine with supervision. Direct hand off to PT.  Therapy Documentation Precautions:  Precautions Precautions: Fall Precaution Comments: BP <150, L hemi, L inattention Restrictions Weight Bearing Restrictions: No    Therapy/Group: Individual Therapy  Amie Critchley 06/07/2021,  2:55 PM

## 2021-06-08 LAB — VITAMIN B12: Vitamin B-12: 1136 pg/mL — ABNORMAL HIGH (ref 180–914)

## 2021-06-08 LAB — MAGNESIUM: Magnesium: 2.2 mg/dL (ref 1.7–2.4)

## 2021-06-08 LAB — FOLATE: Folate: 26.6 ng/mL (ref >5.9)

## 2021-06-08 MED ORDER — MELATONIN 3 MG PO TABS
3.0000 mg | ORAL_TABLET | Freq: Every evening | ORAL | Status: DC | PRN
  Administered 2021-06-12 – 2021-06-15 (×2): 3 mg via ORAL
  Filled 2021-06-08 (×3): qty 1

## 2021-06-08 NOTE — Progress Notes (Signed)
Speech Language Pathology Daily Session Note  Patient Details  Name: CHLORA MCBAIN MRN: [id number] Date of Birth: September 15, 1960  Today's Date: 06/08/2021 SLP Individual Time: 1401-1445 SLP Individual Time Calculation (min): 44 min  Short Term Goals: Week 1: SLP Short Term Goal 1 (Week 1): Patient will demonstrate sustained attention to functional tasks for 10 minute duration with min A verbal redirection SLP Short Term Goal 2 (Week 1): Patient will utilize external memory aids to recall new, daily information with min A verbal cues SLP Short Term Goal 3 (Week 1): Patient will orient x4 with min A cues SLP Short Term Goal 4 (Week 1): Patient will complete complex problem solving tasks with min A verbal cues  Skilled Therapeutic Interventions: Pt seen for skilled ST with focus on cognitive goals, pt in bed in the dark initially asking "how long is this therapy" and stating "well I might fall asleep" however pt did participate actively throughout. Pt with numerous complaints initially during session, easily redirected. SLP facilitating simple alternating attention task by providing overall min A cues for accuracy. Pt reporting cognitive fatigue toward end of activity stating "my brain is mush". Pt demonstrates some impulsivity and decreased safety awareness stating she wants to "get up and walk around the room to gather my things". Pt agreeable to have SLP gather important items around the room to put within reach. Pt left in bed with alarm set and all needs met, cont ST POC.   Pain Pain Assessment Pain Scale: 0-10 Pain Score: 0-No pain  Therapy/Group: Individual Therapy  Dewaine Conger 06/08/2021, 2:44 PM

## 2021-06-08 NOTE — Progress Notes (Signed)
Occupational Therapy Weekly Progress Note  Patient Details  Name: Monica Miranda MRN: [id number] Date of Birth: August 23, 1960  Beginning of progress report period: May 31, 2021 End of progress report period: June 08, 2021        Patient has met 4 of 4 short term goals. Pt currently requires MIN A for bathing in shower, MIN A for UB dressing, MIN A for LB dressing and CGA for ADL transfers with Rw. Pt continues to present with impaired balance, impaired sensation/AROM in LUE, impaired attention, decreased safety awareness and generalized deconditioning impacting pts ability to complete ADLs/ functional mobility independently. Pts daughter in law and daughter have been present during sessions.   Patient continues to demonstrate the following deficits: muscle weakness and muscle paralysis, decreased cardiorespiratoy endurance, motor apraxia, decreased coordination, and decreased motor planning, decreased visual perceptual skills and field cut, decreased attention to left and left side neglect, decreased attention, decreased awareness, decreased problem solving, decreased safety awareness, decreased memory, and delayed processing, and decreased sitting balance, decreased standing balance, decreased postural control, hemiplegia, and decreased balance strategies and therefore will continue to benefit from skilled OT intervention to enhance overall performance with BADL.  Patient not progressing toward long term goals.  See goal revision.  Plan of care revisions: Downgraded goals due to slower progress than expected with above listed barriers.  OT Short Term Goals Week 1:  OT Short Term Goal 1 (Week 1): Pt will don shirt using hemi techniques with min A OT Short Term Goal 1 - Progress (Week 1): Met OT Short Term Goal 2 (Week 1): Pt will complete 3/3 toileting tasks with min A OT Short Term Goal 2 - Progress (Week 1): Met OT Short Term Goal 3 (Week 1): Pt will complete toilet transfer with min  A OT Short Term Goal 3 - Progress (Week 1): Met OT Short Term Goal 4 (Week 1): Pt will don pants with min A OT Short Term Goal 4 - Progress (Week 1): Met Week 2:  OT Short Term Goal 1 (Week 2): pt will incorporate LUE into ADLs during 3 consecutive OT sessions with one verbal cue OT Short Term Goal 2 (Week 2): pt will complete one standing grooming task at sink with CGA OT Short Term Goal 3 (Week 2): pt will completing bathing from shower seat with CGA OT Short Term Goal 4 (Week 2): pt will maintain sustained attention during ADLs to facilitate improved anticipatory awareness for safety during bathing and dressing    Therapy Documentation Precautions:  Precautions Precautions: Fall Precaution Comments: BP <150, L hemi, L inattention Restrictions Weight Bearing Restrictions: No    Therapy/Group: Individual Therapy  Corinne Ports [facility] 06/08/2021, 7:48 AM

## 2021-06-08 NOTE — Progress Notes (Signed)
Occupational Therapy Session Note  Patient Details  Name: Monica Miranda MRN: [id number] Date of Birth: 30-Jul-1960  Today's Date: 06/08/2021 OT Individual Time: 1027-2536 OT Individual Time Calculation (min): 97 min    Short Term Goals: Week 2:  OT Short Term Goal 1 (Week 2): pt will incorporate LUE into ADLs during 3 consecutive OT sessions with one verbal cue OT Short Term Goal 2 (Week 2): pt will complete one standing grooming task at sink with CGA OT Short Term Goal 3 (Week 2): pt will completing bathing from shower seat with CGA OT Short Term Goal 4 (Week 2): pt will maintain sustained attention during ADLs to facilitate improved anticipatory awareness for safety during bathing and dressing  Skilled Therapeutic Interventions/Progress Updates:    Pt semi reclined in bed, no c/o pain, agreeable to OT session.  Supine to sit with supervision.  Educated pt on hemi technique to donn socks and pt able to complete with supervision.  Total assist to donn aircast and left shoe. Pt donned right shoe with setup.  Sit to stand with supervision, ambulated to toilet with RW needing min assist and step by step multimodal cues to attend to left side and RW mgt (pt positioning walker excessively to right side and stepping outside frame).  Pt completed toilet transfer with supervision and had continent episode of urine (see flowsheet).  Clothing management and pericare completed with supervision.  Ambulated to sink needing max multimodal cues and min assist as previously mentioned.  Pt needing multimodal cues to visually scan to left to locate papertowels.  Pt also needing cues to attend to LUE in order to take off walker splint and incorporate into bilateral hand washing.  Pt ambulated back to EOB with increased time, CGA, and max step by step cues for motor planning and sequencing of steps as well as RW management.  Sit to supine with supervision.  Call bell in reach, bed alarm on.  Therapy  Documentation Precautions:  Precautions Precautions: Fall Precaution Comments: BP <150, L hemi, L inattention Restrictions Weight Bearing Restrictions: No    Therapy/Group: Individual Therapy  Amie Critchley 06/08/2021, 12:45 PM

## 2021-06-08 NOTE — Progress Notes (Signed)
Orthopedic Tech Progress Note Patient Details:  Monica Miranda 1960-10-21 [id number]  [name] to apply AIRCAST to patient but she was working with THERAPY. So I notified RN that i left it on patient's bed  Ortho Devices Type of Ortho Device: Ankle Air splint Ortho Device/Splint Location: LLE Ortho Device/Splint Interventions: Ordered, Adjustment   Post Interventions Patient Tolerated: Other (comment) Instructions Provided: Care of device  [name] 06/08/2021, 11:35 AM

## 2021-06-08 NOTE — Progress Notes (Signed)
PROGRESS NOTE   Subjective/Complaints: Continues to have severe headaches which approach 10/10 at times. Daughter at bedside notes that she has been diagnosed with cluster headaches.  She feels that the magnesium irritates her stomach, will d/c Discussed that she has completed recommended course of Keppra, we can d/c  ROS: Patient denies fever, rash, sore throat, blurred vision, nausea, vomiting, diarrhea, cough, shortness of breath, +headache, +constipation, +photosensitivity, +muscle cramps in legs, +Nausea from magnesium   Objective:   No results found. Recent Labs    06/07/21 0509  WBC 4.7  HGB 12.5  HCT 38.8  PLT 302    Recent Labs    06/07/21 0509  NA 139  K 3.7  CL 109  CO2 24  GLUCOSE 92  BUN 21  CREATININE 0.61  CALCIUM 9.1     Intake/Output Summary (Last 24 hours) at 06/08/2021 1119 Last data filed at 06/07/2021 1900 Gross per 24 hour  Intake 240 ml  Output --  Net 240 ml     Pressure Injury 05/25/21 Sacrum Medial;Lower Stage 1 -  Intact skin with non-blanchable redness of a localized area usually over a bony prominence. (Active)  05/25/21 2115  Location: Sacrum  Location Orientation: Medial;Lower  Staging: Stage 1 -  Intact skin with non-blanchable redness of a localized area usually over a bony prominence.  Wound Description (Comments):   Present on Admission: Yes    Physical Exam: Vital Signs Blood pressure 138/75, pulse 61, temperature 98 F (36.7 C), temperature source Oral, resp. rate 16, height 5\' 1"  (1.549 m), weight 44.2 kg, SpO2 100 %. Gen: no distress, normal appearing, sitting upright and appears to be in less pain HEENT: oral mucosa pink and moist, NCAT Cardio: Reg rate Chest: normal effort, normal rate of breathing Abd: soft, non-distended Ext: no edema Psych: Pt's affect is appropriate. Pt is cooperative, Hyperverbose Skin: Clean and intact without signs of breakdown Neuro:   pt is alert and oriented to person, place. Poor insight into deficits. Left central 7, right gaze preference. Mild dysarthria. Poor concentration. LUE 1 to 1+/5. LLE 1+ to 2/5 HE, KE and 0/5 distally. Decreased LT left arm and leg. DTR's 3+ LLE and LUE with early extensor and flexor tone respectively Musculoskeletal: Full ROM, No pain with AROM or PROM in the neck, trunk, or extremities. Posture appropriate     Assessment/Plan: 1. Functional deficits which require 3+ hours per day of interdisciplinary therapy in a comprehensive inpatient rehab setting. Physiatrist is providing close team supervision and 24 hour management of active medical problems listed below. Physiatrist and rehab team continue to assess barriers to discharge/monitor patient progress toward functional and medical goals  Care Tool:  Bathing  Bathing activity did not occur: Safety/medical concerns (no time/declined) Body parts bathed by patient: Right arm, Left arm, Chest, Abdomen, Front perineal area, Buttocks, Right upper leg, Left upper leg, Right lower leg, Left lower leg, Face         Bathing assist Assist Level: Contact Guard/Touching assist     Upper Body Dressing/Undressing Upper body dressing   What is the patient wearing?: Button up shirt    Upper body assist Assist Level: Moderate Assistance -  Patient 50 - 74%    Lower Body Dressing/Undressing Lower body dressing      What is the patient wearing?: Pants, Underwear/pull up     Lower body assist Assist for lower body dressing: Minimal Assistance - Patient > 75%     Toileting Toileting Toileting Activity did not occur (Clothing management and hygiene only): N/A (no void or bm)  Toileting assist Assist for toileting: Minimal Assistance - Patient > 75%     Transfers Chair/bed transfer  Transfers assist     Chair/bed transfer assist level: Contact Guard/Touching assist     Locomotion Ambulation   Ambulation assist      Assist level:  Minimal Assistance - Patient > 75% Assistive device: Walker-rolling Max distance: 35ft   Walk 10 feet activity   Assist     Assist level: Moderate Assistance - Patient - 50 - 74% Assistive device: Hand held assist   Walk 50 feet activity   Assist Walk 50 feet with 2 turns activity did not occur: Safety/medical concerns         Walk 150 feet activity   Assist Walk 150 feet activity did not occur: Safety/medical concerns         Walk 10 feet on uneven surface  activity   Assist Walk 10 feet on uneven surfaces activity did not occur: Safety/medical concerns         Wheelchair     Assist Is the patient using a wheelchair?: Yes Type of Wheelchair: Manual    Wheelchair assist level: Dependent - Patient 0% Max wheelchair distance: 150    Wheelchair 50 feet with 2 turns activity    Assist        Assist Level: Dependent - Patient 0%   Wheelchair 150 feet activity     Assist      Assist Level: Dependent - Patient 0%   Blood pressure 138/75, pulse 61, temperature 98 F (36.7 C), temperature source Oral, resp. rate 16, height 5\' 1"  (1.549 m), weight 44.2 kg, SpO2 100 %.  Medical Problem List and Plan: 1.  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  Impaired mobility: continue Antiembolism stockings, thigh (TED hose) Bilateral lower extremities             -antiplatelet therapy: N/A 3.Headache: Increase topamax to 100mg  BID. CT head stable- discussed with patient.  Continue magnesium. Check level tomorrow.  4. Anxiety: Check vitamin D level tomorrow. Provide emotional support, Therapeutic rec consulted to provide information on bringing in her pet dogs for support.              -antipsychotic agents: N/A 5. Neuropsych: This patient is capable of making decisions on her own behalf. 6. Skin/Wound Care: Routine skin checks 7. Fluids/Electrolytes/Nutrition: encourage fluids  -check labs 12/26 8.  Hypertension.  d/c lisinopril.  Monitor with increased mobility 9.  Seizure prophylaxis.  Decrease keppra to 250  mg twice daily 10.  Hyperlipidemia.  Statin held given ICH. 11.  History of polysubstance abuse.  Provide counseling 12.  History of COPD/tobacco use.  Monitor oxygen saturations every shift. 13. Constipation:             -good results with sorbitol overnight 14. Decreased appetite: edited diet order with her preferences. 15. Low protein levels: discussed good protein sources  16. Transaminitis: Patient may use on med NAC from home- placed order.  17. Constipation: increase magnesium gluconate to 250mg  BID. Resolved. Check magnesium level tomorrow.  18. Right rib pain: rib XR ordered. Discussed that results are normal.  19. Left lower extremity weakness: aircast ordered 20. Cluster headaches: 100% O2 via nonrebreather ordered PRN 21. Nausea: d/c magnesium     LOS: 10 days A FACE TO FACE EVALUATION WAS PERFORMED  Keiko Myricks P Marquies Wanat 06/08/2021, 11:19 AM

## 2021-06-08 NOTE — Progress Notes (Signed)
°   06/08/21 1330  Clinical Encounter Type  Visited With Patient  Visit Type Follow-up;Spiritual support  Referral From Nurse  Consult/Referral To Chaplain   Chaplain Tery Sanfilippo responded to request that patient wants to speak with a Musician. Upon entering the room, the patient was speaking with her physical therapist. I advised the patient that I am not a Musician but can refer to another staff chaplain. The patient expressed appreciation of my visit but decline referral and said she would call her local pastor. This note was prepared by Deneen Harts, M.Div..  For questions please contact by phone [phone].

## 2021-06-08 NOTE — Plan of Care (Signed)
°  Problem: RH Balance Goal: LTG Patient will maintain dynamic standing with ADLs (OT) Description: LTG:  Patient will maintain dynamic standing balance with assist during activities of daily living (OT)  Flowsheets (Taken 06/08/2021 1136) LTG: Pt will maintain dynamic standing balance during ADLs with: (downgraded due to slower progress than expected.) Contact Guard/Touching assist   Problem: RH Bathing Goal: LTG Patient will bathe all body parts with assist levels (OT) Description: LTG: Patient will bathe all body parts with assist levels (OT) Flowsheets (Taken 06/08/2021 1136) LTG: Pt will perform bathing with assistance level/cueing: (downgraded due to slower progress than expected.) Contact Guard/Touching assist LTG: Position pt will perform bathing: Shower   Problem: RH Dressing Goal: LTG Patient will perform lower body dressing w/assist (OT) Description: LTG: Patient will perform lower body dressing with assist, with/without cues in positioning using equipment (OT) Flowsheets (Taken 06/08/2021 1136) LTG: Pt will perform lower body dressing with assistance level of: (downgraded due to slower progress than expected.) Contact Guard/Touching assist   Problem: RH Toileting Goal: LTG Patient will perform toileting task (3/3 steps) with assistance level (OT) Description: LTG: Patient will perform toileting task (3/3 steps) with assistance level (OT)  Flowsheets (Taken 06/08/2021 1136) LTG: Pt will perform toileting task (3/3 steps) with assistance level: (downgraded due to slower progress than expected.) Contact Guard/Touching assist   Problem: RH Functional Use of Upper Extremity Goal: LTG Patient will use RT/LT upper extremity as a (OT) Description: LTG: Patient will use right/left upper extremity as a stabilizer/gross assist/diminished/nondominant/dominant level with assist, with/without cues during functional activity (OT) Flowsheets (Taken 06/08/2021 1136) LTG: Pt will use upper extremity in  functional activity with assistance level of: (downgraded due to slower progress than expected.) Minimal Assistance - Patient > 75%   Problem: RH Tub/Shower Transfers Goal: LTG Patient will perform tub/shower transfers w/assist (OT) Description: LTG: Patient will perform tub/shower transfers with assist, with/without cues using equipment (OT) Flowsheets (Taken 06/08/2021 1136) LTG: Pt will perform tub/shower stall transfers with assistance level of: (downgraded due to slower progress than expected.) Contact Guard/Touching assist LTG: Pt will perform tub/shower transfers from: Tub/shower combination

## 2021-06-08 NOTE — Progress Notes (Signed)
Occupational Therapy Session Note  Patient Details  Name: Monica Miranda MRN: [id number] Date of Birth: 06-16-1960  Today's Date: 06/08/2021 OT Individual Time: 6226-3335 OT Individual Time Calculation (min): 97 min    Short Term Goals: Week 1:  OT Short Term Goal 1 (Week 1): Pt will don shirt using hemi techniques with min A OT Short Term Goal 1 - Progress (Week 1): Met OT Short Term Goal 2 (Week 1): Pt will complete 3/3 toileting tasks with min A OT Short Term Goal 2 - Progress (Week 1): Met OT Short Term Goal 3 (Week 1): Pt will complete toilet transfer with min A OT Short Term Goal 3 - Progress (Week 1): Met OT Short Term Goal 4 (Week 1): Pt will don pants with min A OT Short Term Goal 4 - Progress (Week 1): Met  Skilled Therapeutic Interventions/Progress Updates:  Pt greeted supine in bed reporting migraine but agreeable to OT intervention with increased time and encouragement. Session focus on BADL reeducation, functional mobility, LUE FMC, dynamic standing balance and decreasing overall caregiver burden. Pt worked on functional grasp with  compliant cubes with LUE, pt reports decreased sensation (encouraged pt to mention impaired sensation to MD) in L hand and also reports decreased acuity in L eye, however pt was able to grasp cubes with LUE with increased time and effort mostly using gross grasp. Pt completed supine>sit to R side of bed MOD I. Pt completed ambulation from EOB>shower with rw and CGA, pt did not need step by step cues for gait pattern this session with pt verbally stating all steps out loud during ambulation. Pt completed bathing from shower seat with overall CGA needing CGA for standing balance to wash buttock. Pt using bath mit on L hand this session with good success with pt able to wash her R arm and underarm as well as use her L arm to wash buttock with RUE holding on to grab bar in standing. Pt exited shower with CGA via stand pivot to w/c using grab bars for balance  support, as pt likes to gt dressed in warm bathroom. Pt applied lotion to UB/LB with set- up assist needing cues to incorporate LUE into lotion application as pt noted to be unaware that she still had a lot of lotion in her L hand. Pt needed MOD A for UB dressing today as pt had a button up shirt needing MIN cues to sequence donning and assistance to fasten buttons. Pt able to don pants and underwear with MIN A as pt needs assistance to pull pants up to waist line on L side d/t impaired balance and decreased sensation in L hand. Pt transported out of bathroom from w/c with total A, where pt was set- up for breakfast. Pts grits were cold therefore OTA made pt warm oatmeal. Pt left seated in w/c with alarm belt activated, all needs within reach, MD and daughter present.                          Therapy Documentation Precautions:  Precautions Precautions: Fall Precaution Comments: BP <150, L hemi, L inattention Restrictions Weight Bearing Restrictions: No  Pain: unrated pain reported from migraine, rest breaks provided as needed and lights turned down in room d/t light sensitivity.     Therapy/Group: Individual Therapy  Corinne Ports [facility] 06/08/2021, 10:01 AM

## 2021-06-09 LAB — IRON AND TIBC
Iron: 45 ug/dL (ref 28–170)
Saturation Ratios: 11 % (ref 10.4–31.8)
TIBC: 413 ug/dL (ref 250–450)
UIBC: 368 ug/dL

## 2021-06-09 MED ORDER — NICOTINE POLACRILEX 2 MG MT GUM: 2.0000 mg | CHEWING_GUM | OROMUCOSAL | Status: DC | PRN

## 2021-06-09 NOTE — Progress Notes (Signed)
Physical Therapy Session Note  Patient Details  Name: Monica Miranda MRN: [id number] Date of Birth: 25-Jul-1960  Today's Date: 06/09/2021 PT Individual Time: 0930-1000 PT Individual Time Calculation (min): 30 min  and Today's Date: 06/09/2021 PT Missed Time: 30 Minutes Missed Time Reason: Other (Comment) (breakfast)   Short Term Goals: Week 2:  PT Short Term Goal 1 (Week 2): STG = LTG due to ELOS  Skilled Therapeutic Interventions/Progress Updates:     Pt presents supine in bed, eating breakfast with NT assisting. Pt requesting PT to return later to allow her to eat breakfast. Pt missed 30 minutes of skilled therapy.  Returned at 0930 for remainder of session with pt agreeable to PT tx. Avoided pain questions to reduce pain focused behaviors. Supine<>sit completed with supervision with VC needed to remove blankets and for safety awareness. Donned shoes and AIRCAST to L foot with totalA for time. MD entering room for morning rounds. Pt completed sit<>stand transfer with CGA and RW. Assistance needed for placing LUE into RW splint. Ambulated ~68ft with minA and RW - pt continues to hyperverbalize steps during gait and for functional mobility tasks. With AIRCAST on, she demonstrates improved proprioceptive feedback on L with reduce ankle inversion but continues to shoe "pigeon toed" on L with L knee flexed in stance. Her  gait speed fluctuates depending on internal > external distractions. With increased speed, L foot inversion worsens and sometimes lacks awareness of L foot placement in terminal stance and initial swing. Pt concluded session seated in w/c with safety belt alarm on, all needs in reach, made comfortable, and informed of upcoming therapy schedule to prepare for the day.  Therapy Documentation Precautions:  Precautions Precautions: Fall Precaution Comments: BP <150, L hemi, L inattention Restrictions Weight Bearing Restrictions: No General:    Therapy/Group: Individual  Therapy  Jnaya Butrick P Andrianna Manalang 06/09/2021, 7:45 AM

## 2021-06-09 NOTE — Progress Notes (Signed)
Physical Therapy Session Note  Patient Details  Name: Monica Miranda MRN: [id number] Date of Birth: 06/21/1960  Today's Date: 06/09/2021 PT Individual Time: 0254-8628 PT Individual Time Calculation (min): 28 min   Short Term Goals: Week 1:  PT Short Term Goal 1 (Week 1): Patient will complete bed mobility with supervision PT Short Term Goal 1 - Progress (Week 1): Met PT Short Term Goal 2 (Week 1): Patient will complete sit <> stand with MinA consistently PT Short Term Goal 2 - Progress (Week 1): Met PT Short Term Goal 3 (Week 1): Patient will ambulate >20 ft with LRAD and MinA PT Short Term Goal 3 - Progress (Week 1): Met PT Short Term Goal 4 (Week 1): Patient will improve BBS by >/= 7pts PT Short Term Goal 4 - Progress (Week 1): Not met Week 2:  PT Short Term Goal 1 (Week 2): STG = LTG due to ELOS  Skilled Therapeutic Interventions/Progress Updates:    Pt received in bed with lights out and eye mask on reporting migraine. Pt declined to leave room but requested assist to get dressed. Therapist assisted with donning shirt and threading pants, donning socks in long sitting for improved sitting balance. Performed bridge to pull pants over hips independently. Required mod cueing d/t visual impairment and poor memory. Pt was set up with breakfast sitting up in bed. Pt remained in bed at end of session and was left with all needs in reach and alarm active.   Therapy Documentation Precautions:  Precautions Precautions: Fall Precaution Comments: BP <150, L hemi, L inattention Restrictions Weight Bearing Restrictions: No General: PT Amount of Missed Time (min): 30 Minutes PT Missed Treatment Reason: Other (Comment) (breakfast)     Therapy/Group: Individual Therapy  Mickel Fuchs 06/09/2021, 1:00 PM

## 2021-06-09 NOTE — Progress Notes (Signed)
PROGRESS NOTE   Subjective/Complaints: Continues to have severe migraines at times. NAC appears to help and she prefers to use this rather than prescription medications.  Magnesium has been discontinued since it causes nausea Discussed using oxygen for her cluster headaches  ROS: Patient denies fever, rash, sore throat, blurred vision, nausea, vomiting, diarrhea, cough, shortness of breath, +headache, +constipation, +photosensitivity, +muscle cramps in legs, +Nausea from magnesium   Objective:   No results found. Recent Labs    06/07/21 0509  WBC 4.7  HGB 12.5  HCT 38.8  PLT 302    Recent Labs    06/07/21 0509  NA 139  K 3.7  CL 109  CO2 24  GLUCOSE 92  BUN 21  CREATININE 0.61  CALCIUM 9.1     Intake/Output Summary (Last 24 hours) at 06/09/2021 1136 Last data filed at 06/08/2021 2051 Gross per 24 hour  Intake 477 ml  Output --  Net 477 ml     Pressure Injury 05/25/21 Sacrum Medial;Lower Stage 1 -  Intact skin with non-blanchable redness of a localized area usually over a bony prominence. (Active)  05/25/21 2115  Location: Sacrum  Location Orientation: Medial;Lower  Staging: Stage 1 -  Intact skin with non-blanchable redness of a localized area usually over a bony prominence.  Wound Description (Comments):   Present on Admission: Yes    Physical Exam: Vital Signs Blood pressure 137/89, pulse 70, temperature 98.4 F (36.9 C), resp. rate 16, height 5\' 1"  (1.549 m), weight 44.2 kg, SpO2 96 %. Gen: no distress, normal appearing, sitting upright and appears to be in less pain HEENT: oral mucosa pink and moist, NCAT Cardio: Reg rate Chest: normal effort, normal rate of breathing Abd: soft, non-distended Ext: no edema Psych: Pt's affect is appropriate. Pt is cooperative, Hyperverbose Skin: Clean and intact without signs of breakdown Neuro:  pt is alert and oriented to person, place. Poor insight into  deficits. Left central 7, right gaze preference. Mild dysarthria. Poor concentration. LUE 1 to 1+/5. LLE 1+ to 2/5 HE, KE and 0/5 distally. Decreased LT left arm and leg. DTR's 3+ LLE and LUE with early extensor and flexor tone respectively Musculoskeletal: Full ROM, No pain with AROM or PROM in the neck, trunk, or extremities. Posture appropriate  GU: some incontinent episodes    Assessment/Plan: 1. Functional deficits which require 3+ hours per day of interdisciplinary therapy in a comprehensive inpatient rehab setting. Physiatrist is providing close team supervision and 24 hour management of active medical problems listed below. Physiatrist and rehab team continue to assess barriers to discharge/monitor patient progress toward functional and medical goals  Care Tool:  Bathing  Bathing activity did not occur: Safety/medical concerns (no time/declined) Body parts bathed by patient: Right arm, Left arm, Chest, Abdomen, Front perineal area, Buttocks, Right upper leg, Left upper leg, Right lower leg, Left lower leg, Face         Bathing assist Assist Level: Contact Guard/Touching assist     Upper Body Dressing/Undressing Upper body dressing   What is the patient wearing?: Button up shirt    Upper body assist Assist Level: Moderate Assistance - Patient 50 - 74%  Lower Body Dressing/Undressing Lower body dressing      What is the patient wearing?: Pants, Underwear/pull up     Lower body assist Assist for lower body dressing: Minimal Assistance - Patient > 75%     Toileting Toileting Toileting Activity did not occur (Clothing management and hygiene only): N/A (no void or bm)  Toileting assist Assist for toileting: Minimal Assistance - Patient > 75%     Transfers Chair/bed transfer  Transfers assist     Chair/bed transfer assist level: Contact Guard/Touching assist     Locomotion Ambulation   Ambulation assist      Assist level: Minimal Assistance - Patient >  75% Assistive device: Walker-rolling Max distance: 64ft   Walk 10 feet activity   Assist     Assist level: Moderate Assistance - Patient - 50 - 74% Assistive device: Hand held assist   Walk 50 feet activity   Assist Walk 50 feet with 2 turns activity did not occur: Safety/medical concerns         Walk 150 feet activity   Assist Walk 150 feet activity did not occur: Safety/medical concerns         Walk 10 feet on uneven surface  activity   Assist Walk 10 feet on uneven surfaces activity did not occur: Safety/medical concerns         Wheelchair     Assist Is the patient using a wheelchair?: Yes Type of Wheelchair: Manual    Wheelchair assist level: Dependent - Patient 0% Max wheelchair distance: 150    Wheelchair 50 feet with 2 turns activity    Assist        Assist Level: Dependent - Patient 0%   Wheelchair 150 feet activity     Assist      Assist Level: Dependent - Patient 0%   Blood pressure 137/89, pulse 70, temperature 98.4 F (36.9 C), resp. rate 16, height 5\' 1"  (1.549 m), weight 44.2 kg, SpO2 96 %.  Medical Problem List and Plan: 1.  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  Impaired mobility: continue Antiembolism stockings, thigh (TED hose) Bilateral lower extremities             -antiplatelet therapy: N/A 3.Headache: Increase topamax to 100mg  BID. CT head stable- discussed with patient. Continue magnesium. Check level tomorrow.  4. Anxiety: Check  vitamin D level tomorrow. Provide emotional support, Therapeutic rec consulted to provide information on bringing in her pet dogs for support.              -antipsychotic agents: N/A 5. Neuropsych: This patient is capable of making decisions on her own behalf. 6. Skin/Wound Care: Routine skin checks 7. Fluids/Electrolytes/Nutrition: encourage fluids  -check labs 12/26 8.  Hypertension.  d/c lisinopril.  Monitor with increased mobility 9.  Seizure prophylaxis.  Decrease keppra to 250 mg twice daily 10.  Hyperlipidemia.  Statin held given  ICH. 11.  History of polysubstance abuse.  Provide counseling 12.  History of COPD/tobacco use.  Monitor oxygen saturations every shift. 13. Constipation:             -good results with sorbitol overnight 14. Decreased appetite: edited diet order with her preferences. 15. Low protein levels: discussed good protein sources  16. Transaminitis: Patient may use on med NAC from home- placed order.  17. Constipation: increase magnesium gluconate to 250mg  BID. Resolved. Check magnesium level tomorrow.  18. Right rib pain: rib XR ordered. Discussed that results are normal.  19. Left lower extremity weakness: aircast ordered 20. Cluster headaches: 100% O2 via nonrebreather ordered PRN, discussed with patient and nursing 21. Nausea: d/c magnesium 22. B12 deficiency: check homocysteine level 23. Neuropathy: B12 and folate levels checked and within normal limits. F/u B6     LOS: 11 days A FACE TO FACE EVALUATION WAS PERFORMED  Oluwademilade Kellett 06/09/2021, 11:36 AM

## 2021-06-09 NOTE — Progress Notes (Signed)
Patient ID: Monica Miranda, female   DOB: 1960-07-21, 61 y.o.   MRN: [id number]  Met with pt and had Monica Miranda-daughter on speaker phone to update them regarding team conference progressing toward her goals of CGA and still shooting for discharge of 1/12. Pt feels she is doing her best but really wants to be as independent as possible before going home. She missies her fur babies and can't wait to cuddle with them. Monica Miranda is pleased with Mom's progress but does have some questions for her MD. Have messaged MD to call daughter and gave her daughter's number. Both aware pt will need 24/7 care at discharge. Pt has a [facility] phone interview Tuesday, Monica Miranda or Monica Miranda will be here and have pt on speaker so both can answer the questions being asked and pt is included in interview. Pt continues to battle her headaches and is working with MD on medications and wants to talk with her regarding alternative means also. Will continue to work on discharge needs. Monica Miranda reports they have a tub seat and are getting bedside commode. Try to get her Monica Miranda home health at discharge.

## 2021-06-09 NOTE — Patient Care Conference (Signed)
Inpatient RehabilitationTeam Conference and Plan of Care Update Date: 06/09/2021   Time: 11:03 AM    Patient Name: Monica Miranda      Medical Record Number: [id number]  Date of Birth: [birth year] Sex: Female         Room/Bed: 4M12C/4M12C-01 Payor Info: Payor: MEDICAID PENDING / Plan: MEDICAID PENDING / Product Type: *No Product type* /    Admit Date/Time:  05/29/2021  3:18 PM  Primary Diagnosis:  ICH (intracerebral hemorrhage) [facility])  Hospital Problems: Principal Problem:   ICH (intracerebral hemorrhage) [facility])    Expected Discharge Date: Expected Discharge Date: 06/17/21  Team Members Present: Physician leading conference: Dr. Sula Soda Social Worker Present: Dossie Der, LCSW Nurse Present: Chana Bode, RN PT Present: Wynelle Link, PT OT Present: Perrin Maltese, OT PPS Coordinator present : Fae Pippin, SLP     Current Status/Progress Goal Weekly Team Focus  Bowel/Bladder   Continent with some incontinent episodes. LBm 1/2  Decrease incontinent episodes.  Toilet PRN   Swallow/Nutrition/ Hydration             ADL's             Mobility   supervision bed mobility, minA stand<>pivot transfer with RW, min/modA gait ~16ft with RW. Motor apraxia, hyperverbose, L inattention.  spv (will likely downgrade to CGA)  NMR to L, gait progressions, safety awareness, pt/family ed, DC planning   Communication             Safety/Cognition/ Behavioral Observations  min A  mod I-to-sup A  functional recall, use of memory book, complex problem solving, sustained/selective attention   Pain   c/o migraine headaches, PRNs available.  Decrease use of PRN medications to control pain.  Assess Qshift and prn   Skin   Skin intact  Maintain skin integrity  Assess Qshift and prn     Discharge Planning:  Family here daily and provides support to pt. Have set up PCP for follow up and working on DME and follow up. Pt doing her best to improve and recover from this CVA   Team  Discussion: Patient with headaches and neuropathy in hands. MD checking labs and discontinued Keppra. Patient with left inattention, motor apraxia, problem solving issues and ankle inversion/instability post ICH.  Patient on target to meet rehab goals: yes, currently needs mod assist for upper body bathing/dressing and max assist for lower body dressing. Requires mod assist for toileting due to left side weakness and inattention. Needs min assist for sit- stand and stand pivot transfers but able to ambulate short distances with min - mod assist.   *See Care Plan and progress notes for long and short-term goals.   Revisions to Treatment Plan:  Downgraded goals to CGA   Teaching Needs: Safety, transfers, medication management, secondary risk management, etc  Current Barriers to Discharge: Decreased caregiver support and Home enviroment access/layout  Possible Resolutions to Barriers: Family education with son/daughter- in-law     Medical Summary Current Status: sacral pressure injury, ICH, migraines, B12 deficiency  Barriers to Discharge: Medical stability  Barriers to Discharge Comments: sacral pressure injury, ICH, migraines, B12 deficiency Possible Resolutions to [employer] Focus: continue faily wound care with foam dressing, provided education regarding ICH, discussed results of new CT with her, discussed trial of 100% oxygen for her cluster headaches   Continued Need for Acute Rehabilitation Level of Care: The patient requires daily medical management by a physician with specialized training in physical medicine and rehabilitation for the following reasons: Direction  of a multidisciplinary physical rehabilitation program to maximize functional independence : Yes Medical management of patient stability for increased activity during participation in an intensive rehabilitation regime.: Yes Analysis of laboratory values and/or radiology reports with any subsequent need for  medication adjustment and/or medical intervention. : Yes   I attest that I was present, lead the team conference, and concur with the assessment and plan of the team.   Chana Bode B 06/09/2021, 11:54 AM

## 2021-06-09 NOTE — Progress Notes (Signed)
Physical Therapy Session Note  Patient Details  Name: Monica Miranda MRN: [id number] Date of Birth: 1960-07-03  Today's Date: 06/09/2021 PT Individual Time: 1030-1130 PT Individual Time Calculation (min): 60 min   Short Term Goals: Week 2:  PT Short Term Goal 1 (Week 2): STG = LTG due to ELOS  Skilled Therapeutic Interventions/Progress Updates: Pt presented in bed agreeable to therapy with encouragement. Pt states had migraine earlier this am and has been trying to let it "calm down". PTA provided distraction and darker rooms to alleviate as possible. Pt hyperverbal discussing trial of air cast today as well as what she had done during therapies this am. Once PTA able to redirect pt performed bed mobility with supervision and use of bed features. PTA donned sneakers total A for time management. PTA was able to get sneakers ([employer]) on pt's feet without difficulty. Explained to pt these may be most appropriate shoes at this time due to air cast. Pt then ambulated from room to ortho gym ~81ft with RW and minA. Pt required cues for pacing as pt was able to maintain L foot in neutral when ambulating at slower speed but when increased pt unable to maintain and goes into inversion with pt unaware. In ortho gym pt participated in toe taps to target to 4 in step for forced use of LLE and coordination. Pt able to complete 2 x 10 with RW and x 8 without AD. Pt able to coordinate target but required increased effort to limit abduction. Due to fatigue pt requesting to return to room via w/c. Once obtained pt performed stand pivot transfer to w/c with RW and CGA with min cues. Pt transported back to room dependently for energy conservation and performed ambulatory transfer to bed in same manner as prior. Pt left sitting at EOB with Becky, LSW present, call bell within reach, bed alarm at lowest setting and nsg notified.     Therapy Documentation Precautions:  Precautions Precautions: Fall Precaution Comments: BP  <150, L hemi, L inattention Restrictions Weight Bearing Restrictions: No General: PT Amount of Missed Time (min): 30 Minutes PT Missed Treatment Reason: Other (Comment) (breakfast) Vital Signs:   Pain:   Mobility:   Locomotion :    Trunk/Postural Assessment :    Balance:   Exercises:   Other Treatments:      Therapy/Group: Individual Therapy  Madolin Twaddle 06/09/2021, 12:40 PM

## 2021-06-09 NOTE — Progress Notes (Signed)
Occupational Therapy Session Note  Patient Details  Name: Monica Miranda MRN: [id number] Date of Birth: 09-Aug-1960  Today's Date: 06/09/2021 OT Individual Time: 1450-1530 OT Individual Time Calculation (min): 40 min    Short Term Goals: Week 2:  OT Short Term Goal 1 (Week 2): pt will incorporate LUE into ADLs during 3 consecutive OT sessions with one verbal cue OT Short Term Goal 2 (Week 2): pt will complete one standing grooming task at sink with CGA OT Short Term Goal 3 (Week 2): pt will completing bathing from shower seat with CGA OT Short Term Goal 4 (Week 2): pt will maintain sustained attention during ADLs to facilitate improved anticipatory awareness for safety during bathing and dressing  Skilled Therapeutic Interventions/Progress Updates:  Skilled OT intervention completed with focus on functional transfers, activity tolerance, NMR of LUE with focus on elbow extension, cardiovascular/BUE endurance. Pt received in side-lying in bed, agreeable to session. Pt verbose throughout session, requiring increased time during transitions, and requested therapist to extensively explain purpose behind tasks. Completed bed mobility with mod I, then sit > stand using RW with hand splint with CGA, then stand pivot to w/c with CGA. Pt transported to therapy gym with total A for time management. Pt presenting with limited AROM in L elbow beyond 90 degrees of extension. Pt sit > stand without AD, with CGA, then side step to SCIFIT seat with min A and mod-max verbal cues needed for positioning of hips and BLEs due to poor motor planning and pt feeling "wonky" without RW. Pt completed total of 3 mins, first half on level 1.5, 2nd half on 2.5 level, on SCIFIT going forwards to promote LUE NMR, AAROM and elbow extension needed for self-care tasks. Therapist providing light hand over hand stability for gross grasp of L hand, and light facilitatory input at elbow for increased elbow extension. Pt with no c/o pain  throughout. Pt pivot with cues on SCIFIT seat, then sit > stand with CGA with R hand held assist, and stand pivot with CGA to w/c with mod verbal cues for sequencing and motor planning. Pt left supine in bed in pt's room, with pt's sister in room, bed alarm on and all needs in reach/met at end of session.   Therapy Documentation Precautions:  Precautions Precautions: Fall Precaution Comments: BP <150, L hemi, L inattention Restrictions Weight Bearing Restrictions: No  Pain: No c/o pain   Therapy/Group: Individual Therapy  [name] 06/09/2021, 8:02 AM

## 2021-06-09 NOTE — Progress Notes (Signed)
Occupational Therapy Session Note  Patient Details  Name: Monica Miranda MRN: [id number] Date of Birth: 28-Nov-1960  Today's Date: 06/09/2021 OT Individual Time: 0865-7846 OT Individual Time Calculation (min): 29 min    Short Term Goals: Week 2:  OT Short Term Goal 1 (Week 2): pt will incorporate LUE into ADLs during 3 consecutive OT sessions with one verbal cue OT Short Term Goal 2 (Week 2): pt will complete one standing grooming task at sink with CGA OT Short Term Goal 3 (Week 2): pt will completing bathing from shower seat with CGA OT Short Term Goal 4 (Week 2): pt will maintain sustained attention during ADLs to facilitate improved anticipatory awareness for safety during bathing and dressing  Skilled Therapeutic Interventions/Progress Updates:    Pt in bed to start session with agreement to participate in therapy.  She was able to transfer to the EOB with min assist and then worked on donning her shoes with mod assist.  Worked on sit to stand and standing balance from the EOB while incorporating functional reach to pick up objects from the chair in front of her with the LUE and then place them on the bedside table.  Min assist for standing balance with mod instructional cueing for sequencing placement of her feet and forward weightshift for standing.  She exhibited increased lean and LOB to the left side on one occasion.  She was able to reach and pick up small crumpled up pieces of paper with occasional min assist and min instructional cueing to try and extend the left elbow completely.  After several repetitions in standing she was able to sit back down and therapist assisted with removal of shoes at min assist level.  She then transitioned back to supine with supervision.  Instructed her to continue working on picking up objects with the LUE off of the bedside table and opening them as needed, and placing them back.  Pt voices understanding to complete task on her own.  Call button and phone  in reach with safety alarm in place.    Therapy Documentation Precautions:  Precautions Precautions: Fall Precaution Comments: BP <150, L hemi, L inattention Restrictions Weight Bearing Restrictions: No  Pain: Pain Assessment Pain Scale: 0-10 Pain Score: 0-No pain Faces Pain Scale: Hurts a little bit Pain Type: Acute pain Pain Location: Head Pain Orientation: Anterior Pain Descriptors / Indicators: Headache Pain Onset: On-going Pain Intervention(s): Repositioned;Emotional support;Distraction ADL: See Care Tool Section for some details for mobility and selfcare   Therapy/Group: Individual Therapy  Nathan Stallworth OTR/L 06/09/2021, 3:58 PM

## 2021-06-09 NOTE — Progress Notes (Signed)
Speech Language Pathology Daily Session Note  Patient Details  Name: Monica Miranda MRN: [id number] Date of Birth: [birth year]  Today's Date: 06/09/2021 SLP Individual Time: 1400-1445 SLP Individual Time Calculation (min): 45 min  Short Term Goals: Week 1: SLP Short Term Goal 1 (Week 1): Patient will demonstrate sustained attention to functional tasks for 10 minute duration with min A verbal redirection SLP Short Term Goal 2 (Week 1): Patient will utilize external memory aids to recall new, daily information with min A verbal cues SLP Short Term Goal 3 (Week 1): Patient will orient x4 with min A cues SLP Short Term Goal 4 (Week 1): Patient will complete complex problem solving tasks with min A verbal cues  Skilled Therapeutic Interventions: Skilled ST treatment focused on cognitive goals. SLP facilitated session by providing sup A verbal cues for medication label comprehension, problem solving, decision making, and reasoning pertaining to hypothetical scenarios with medication. Patient required min A verbal redirection to sustain attention to task and continues to be hyperverbal which can limit participation at times along with internal and external distractibility. Patient was left in bed with alarm activated and immediate needs within reach at end of session. Continue per current plan of care.      Pain Pain Assessment Pain Scale: 0-10 Pain Score: 0-No pain  Therapy/Group: Individual Therapy  Tamala Ser 06/09/2021, 2:43 PM

## 2021-06-10 LAB — VITAMIN B6: Vitamin B6: 12.9 ug/L (ref 3.4–65.2)

## 2021-06-10 LAB — HOMOCYSTEINE: Homocysteine: 4.5 umol/L (ref 0.0–17.2)

## 2021-06-10 MED ORDER — ONDANSETRON HCL 4 MG PO TABS
4.0000 mg | ORAL_TABLET | Freq: Three times a day (TID) | ORAL | Status: DC | PRN
  Administered 2021-06-10: 4 mg via ORAL
  Filled 2021-06-10 (×3): qty 1

## 2021-06-10 NOTE — Progress Notes (Signed)
Respiratory therapist called this RN about an order. Called Pam Love PA and verified order. Order changed and entered into EPIC

## 2021-06-10 NOTE — Progress Notes (Signed)
PROGRESS NOTE   Subjective/Complaints: Had a severe migraine with associated nausea this morning that almost made her miss therapy with [facility]. She is now in the gym. I asked if she would like a medicine to help with the nausea  ROS: Patient denies fever, rash, sore throat, +headache, +constipation, +photosensitivity, +muscle cramps in legs, +Nausea from magnesium, +nausea associated with her migraines.    Objective:   No results found. No results for input(s): WBC, HGB, HCT, PLT in the last 72 hours.   No results for input(s): NA, K, CL, CO2, GLUCOSE, BUN, CREATININE, CALCIUM in the last 72 hours.    Intake/Output Summary (Last 24 hours) at 06/10/2021 0955 Last data filed at 06/10/2021 [id number] Gross per 24 hour  Intake 236 ml  Output --  Net 236 ml     Pressure Injury 05/25/21 Sacrum Medial;Lower Stage 1 -  Intact skin with non-blanchable redness of a localized area usually over a bony prominence. (Active)  05/25/21 2115  Location: Sacrum  Location Orientation: Medial;Lower  Staging: Stage 1 -  Intact skin with non-blanchable redness of a localized area usually over a bony prominence.  Wound Description (Comments):   Present on Admission: Yes    Physical Exam: Vital Signs Blood pressure 132/83, pulse (!) 57, temperature 97.8 F (36.6 C), temperature source Oral, resp. rate 16, height 5\' 1"  (1.549 m), weight 44.2 kg, SpO2 98 %. Gen: no distress, normal appearing, sitting upright and appears to be in less pain HEENT: oral mucosa pink and moist, NCAT Cardio: Bradycardic Chest: normal effort, normal rate of breathing Abd: soft, non-distended Ext: no edema Psych: Pt's affect is appropriate. Pt is cooperative, Hyperverbose Skin: Clean and intact without signs of breakdown Neuro:  pt is alert and oriented to person, place. Poor insight into deficits. Left central 7, right gaze preference. Mild dysarthria. Poor  concentration. LUE 1 to 1+/5. LLE 1+ to 2/5 HE, KE and 0/5 distally. Decreased LT left arm and leg. DTR's 3+ LLE and LUE with early extensor and flexor tone respectively Musculoskeletal: Full ROM, No pain with AROM or PROM in the neck, trunk, or extremities. Posture appropriate  GU: some incontinent episodes    Assessment/Plan: 1. Functional deficits which require 3+ hours per day of interdisciplinary therapy in a comprehensive inpatient rehab setting. Physiatrist is providing close team supervision and 24 hour management of active medical problems listed below. Physiatrist and rehab team continue to assess barriers to discharge/monitor patient progress toward functional and medical goals  Care Tool:  Bathing  Bathing activity did not occur: Safety/medical concerns (no time/declined) Body parts bathed by patient: Right arm, Left arm, Chest, Abdomen, Front perineal area, Buttocks, Right upper leg, Left upper leg, Right lower leg, Left lower leg, Face         Bathing assist Assist Level: Contact Guard/Touching assist     Upper Body Dressing/Undressing Upper body dressing   What is the patient wearing?: Button up shirt    Upper body assist Assist Level: Moderate Assistance - Patient 50 - 74%    Lower Body Dressing/Undressing Lower body dressing      What is the patient wearing?: Pants, Underwear/pull up  Lower body assist Assist for lower body dressing: Minimal Assistance - Patient > 75%     Toileting Toileting Toileting Activity did not occur Press photographer and hygiene only): N/A (no void or bm)  Toileting assist Assist for toileting: Minimal Assistance - Patient > 75%     Transfers Chair/bed transfer  Transfers assist     Chair/bed transfer assist level: Contact Guard/Touching assist     Locomotion Ambulation   Ambulation assist      Assist level: Minimal Assistance - Patient > 75% Assistive device: Walker-rolling Max distance: 31ft   Walk 10  feet activity   Assist     Assist level: Moderate Assistance - Patient - 50 - 74% Assistive device: Hand held assist   Walk 50 feet activity   Assist Walk 50 feet with 2 turns activity did not occur: Safety/medical concerns         Walk 150 feet activity   Assist Walk 150 feet activity did not occur: Safety/medical concerns         Walk 10 feet on uneven surface  activity   Assist Walk 10 feet on uneven surfaces activity did not occur: Safety/medical concerns         Wheelchair     Assist Is the patient using a wheelchair?: Yes Type of Wheelchair: Manual    Wheelchair assist level: Dependent - Patient 0% Max wheelchair distance: 150    Wheelchair 50 feet with 2 turns activity    Assist        Assist Level: Dependent - Patient 0%   Wheelchair 150 feet activity     Assist      Assist Level: Dependent - Patient 0%   Blood pressure 132/83, pulse (!) 57, temperature 97.8 F (36.6 C), temperature source Oral, resp. rate 16, height 5\' 1"  (1.549 m), weight 44.2 kg, SpO2 98 %.  Medical Problem List and Plan: 1.  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  Impaired mobility: continue Antiembolism stockings, thigh (TED hose) Bilateral lower extremities             -antiplatelet therapy: N/A 3.Headache: Increase topamax to 100mg  BID. CT head stable- discussed with patient. Continue magnesium. Check level tomorrow.  4. Anxiety: Check vitamin D level tomorrow. Provide  emotional support, Therapeutic rec consulted to provide information on bringing in her pet dogs for support.              -antipsychotic agents: N/A 5. Neuropsych: This patient is capable of making decisions on her own behalf. 6. Skin/Wound Care: Routine skin checks 7. Fluids/Electrolytes/Nutrition: encourage fluids  -check labs 12/26 8.  Hypertension.  d/c lisinopril.  Monitor with increased mobility 9.  Seizure prophylaxis.  Decrease keppra to 250 mg twice daily 10.  Hyperlipidemia.  Statin held given ICH. 11.  History of polysubstance abuse.  Provide counseling 12.  History of COPD/tobacco use.  Monitor oxygen  saturations every shift. 13. Constipation:             -good results with sorbitol overnight 14. Decreased appetite: edited diet order with her preferences. 15. Low protein levels: discussed good protein sources  16. Transaminitis: Patient may use on med NAC from home- placed order.  17. Constipation: increase magnesium gluconate to 250mg  BID. Resolved. Check magnesium level tomorrow.  18. Right rib pain: rib XR ordered. Discussed that results are normal.  19. Left lower extremity weakness: aircast ordered 20. Cluster headaches: 100% O2 via nonrebreather ordered PRN, discussed with patient and nursing 21. Nausea: d/c magnesium. Zofran ordered.  22. B12 deficiency: Discussed B12 level is elevated and homocysteine level is normal.  23. Neuropathy: B12 and folate levels checked and within normal limits. F/u B6 24. Bradycardia: medications reviewed, continue to monitor TID.      LOS: 12 days A FACE TO FACE EVALUATION WAS PERFORMED  Declan Adamson 06/10/2021, 9:55 AM

## 2021-06-10 NOTE — Evaluation (Signed)
Recreational Therapy Assessment and Plan  Patient Details  Name: Monica Miranda MRN: [id number] Date of Birth: 04/09/1961 Today's Date: 06/10/2021  Rehab Potential:  Good ELOS:   d/c 06/15/21  Assessment Hospital Problem: Principal Problem:   ICH (intracerebral hemorrhage) ([place])     Past Medical History:      Past Medical History:  Diagnosis Date   COPD (chronic obstructive pulmonary disease) ([place])     Rib fracture      4 months ago   Stroke [facility])      Past Surgical History:       Past Surgical History:  Procedure Laterality Date   ESOPHAGEAL DILATION          Assessment & Plan Clinical Impression: Patient is a 61 y.o. year old female with history of hypertension, hyperlipidemia, COPD/tobacco use as well as polysubstance abuse.  Per chart review patient had been living  alone in [place] for the last 2 years and reportedly independent.  Admission at [facility] in [facility] 12/2 to 05/13/2021 for large right thalamic/basal ganglia/frontotemporal intraparenchymal hemorrhage with an ICH score of 3 initially at outside hospital.  [place] was stable on multiple repeat CTs.  Her drug screen was positive for cocaine and amphetamines as well as a long history of alcohol use.  She was started on hypertonic saline monitor closely in the ICU required nicardipine drip.  She was discharged on salt tablets 2 g every 8 hours with sodium greater than 140 as well as started on Keppra 500 twice daily for seizure prophylaxis.  She was discharged planning to live with her son and daughter in law in [place] and on the drive from [place] noted increased left-sided weakness as well as altered mental status while driving through [place].  She did not seek attention for that and made it home and  presented to [facility] 05/26/2021.  A CT of the head as well as cervical spine showed large intraparenchymal hemorrhage involving posterior right frontal lobe moderate amount of surrounding edema and  associated mass-effect on the right lateral ventricle and proximally 11 mm right to left midline shift.  No acute traumatic cervical spine pathology.  MRA/MRI large intraparenchymal hemorrhages right frontotemporal region estimated volume 54 mL.  Medial extension to involve right basal ganglia thalamus right cerebral peduncle and midbrain.  No visible underlying lesion or structural abnormality.  Associated regional mass-effect 5 mm right to left shift as well as associated small volume subarachnoid hemorrhage within the adjacent posterior right parietal temporal region.  Echocardiogram with ejection fraction of 60 to 65% no wall motion abnormality.  Admission chemistries were unremarkable except alkaline phosphatase 138, troponin negative, urine drug screen negative.  She remains on Keppra for seizure prophylaxis.  Tolerating a regular diet.  Therapy evaluations completed due to patient's left-sided weakness decreased functional mobility was admitted for a comprehensive rehab program.   Pt presents with decreased activity tolerance, decreased functional mobility, decreased balance decreased vision, decreased midline orientation, left inattention, decreased awareness, decreased problem solving, decreased safety awareness, decreased memory, and delayed processing, feelings of stress and anxiety Limiting pt's independence with leisure/community pursuits.  Met with pt today to discuss leisure interests, coping strategies & pet therapy.  Discussion included the importance of social, emotional & spiritual health in addition to the physical and cognitive.  Pt stated understanding.  Pt spent a great deal of time talking about the importance of "fur babies" in her life and how much she missed her personal dogs and was  having difficulty not being with them.  LRT discussed our Animal Assisted Therapies/Activities program with pt and she is anxious to participate.  LRT has arranged and scheduled our Pet Partner Team to  visit with pt next week.  Pt voiced excitement and appreciation for this service.   Plan  Min 1 TR session per week >20 minutes  Recommendations for other services: None   Discharge Criteria: Patient will be discharged from TR if patient refuses treatment 3 consecutive times without medical reason.  If treatment goals not met, if there is a change in medical status, if patient makes no progress towards goals or if patient is discharged from hospital.  The above assessment, treatment plan, treatment alternatives and goals were discussed and mutually agreed upon: by patient  C-Road 06/10/2021, 3:16 PM

## 2021-06-10 NOTE — Progress Notes (Signed)
Speech Language Pathology Daily Session Note  Patient Details  Name: Monica Miranda MRN: [id number] Date of Birth: 29-Mar-1961  Today's Date: 06/10/2021 SLP Individual Time: 1305-1400 SLP Individual Time Calculation (min): 55 min  Short Term Goals: Week 1: SLP Short Term Goal 1 (Week 1): Patient will demonstrate sustained attention to functional tasks for 10 minute duration with min A verbal redirection SLP Short Term Goal 2 (Week 1): Patient will utilize external memory aids to recall new, daily information with min A verbal cues SLP Short Term Goal 3 (Week 1): Patient will orient x4 with min A cues SLP Short Term Goal 4 (Week 1): Patient will complete complex problem solving tasks with min A verbal cues  Skilled Therapeutic Interventions:   Patient seen for skilled ST session with focus on cognitive function goals. Patient sitting in [facility] in room and had been waiting for SLP as she had checked her schedule prior. She was pleasant but very talkative and required frequent cues to redirect her attention and to interrupt her when she was not pausing when talking. Patient able to recall names of two of the other SLP's who have worked with her as well as tasks they had completed. She told this SLP that she hoped we would be working on "some fun brain games". SLP introduced task of category naming with constraint of having to use particular alphabet letters in initial position of the words. (Scattergories type). Patient required modA verbal cues to redirect attention to task and min-modA cues to maintain topic/category during structured task completion. She appeared to enjoy task and was able to formulate several unique responses. She then reported that she needed a break as her "brain hurt". SLP discussed patients overall progress with therapies. She was left in [facility] with all needs within reach. She continues to benefit from skilled SLP intervention to maximize cognitive function goals prior to  discharge.  Pain Pain Assessment Pain Scale: 0-10 Pain Score: 0-No pain  Therapy/Group: Individual Therapy  Angela Nevin, MA, CCC-SLP Speech Therapy

## 2021-06-10 NOTE — Progress Notes (Signed)
Physical Therapy Session Note  Patient Details  Name: Monica Miranda MRN: [id number] Date of Birth: 1960/10/02  Today's Date: 06/10/2021 PT Individual Time: 0800-0900 + 1405-1430 PT Individual Time Calculation (min): 60 min  + 25 min  Short Term Goals: Week 2:  PT Short Term Goal 1 (Week 2): STG = LTG due to ELOS  Skilled Therapeutic Interventions/Progress Updates:      1st session: Pt supine in bed to start, agreeable to PT tx. Reports 6/10 HA and some nausea - LPN aware who provided morning medications to assist with management. Pt completed supine<>sit with supervision with HOB elevated. Donned ASICS and L AIRCAST with totalA for time management. Assessed L ankle strength to determine bracing needs. Pt demonstrates adequate ankle strength with DF/inversion/eversion - suspect that ankle inversion is related to sensory/proprioceptive deficits as well as L inattention. Spent time discussing ankle positioning during gait to reduce inversion and "pigeon toed" in stance phase to reduce risk of falling and ankle injury. Sit<>stand to RW with CGA and completed stand<>pivot transfer to w/c with CGA and RW with min verbal cues for turning and safety awareness while sitting. Transported to main rehab gym for time in w/c and assisted to mat table in similar manner.   Completed 5xSTS x3 from lowered mat table without UE support -12.6 seconds -12.3 seconds -10.6 seconds -AVG = 11.8 seconds *Scores >15 seconds indicate increased falls risk  Completed TUG x3 with RW and CGA for safety -19.8 seconds -38.5 seconds -49.5 seconds -AVG = 35.9 seconds *Scores >13.5 seconds indicate increased falls risk  Education on safety awareness, pacing activity, and energy conservation during rest breaks.   MD arriving during treatment session for morning rounds.   Pt returned to room in w/c at end of session. Remained seated in w/c with safety belt alarm on, all immediate needs within reach, made comfortable.    2nd session: Pt sitting in w/c at start of session and agreeable to PT tx. Avoiding asking pain to reduce pain focused behaviors. Transported in w/c to day room rehab gym for time where she was assisted onto Nustep with minA stand<>pivot transfer. Required assistance for setup on Nustep and used Wellgrip for LUE to promote weight bearing and reduce distractions with her paretic LUE. Completed x8 minutes at workload 6 with cues for maintaining cadence ~50 steps/minute. Pt assisted back into w.c in similar manner and returned to room where family was at the bedside. Pt concluded session seated in w/c with safety belt alarm on and all needs in reach.   Therapy Documentation Precautions:  Precautions Precautions: Fall Precaution Comments: BP <150, L hemi, L inattention Restrictions Weight Bearing Restrictions: No General:    Therapy/Group: Individual Therapy  Monica Miranda PT 06/10/2021, 7:34 AM

## 2021-06-10 NOTE — Progress Notes (Signed)
Occupational Therapy Session Note  Patient Details  Name: Monica Miranda MRN: [id number] Date of Birth: December 22, 1960  Today's Date: 06/10/2021 OT Individual Time: 2094-7096 OT Individual Time Calculation (min): 80 min    Short Term Goals: Week 2:  OT Short Term Goal 1 (Week 2): pt will incorporate LUE into ADLs during 3 consecutive OT sessions with one verbal cue OT Short Term Goal 2 (Week 2): pt will complete one standing grooming task at sink with CGA OT Short Term Goal 3 (Week 2): pt will completing bathing from shower seat with CGA OT Short Term Goal 4 (Week 2): pt will maintain sustained attention during ADLs to facilitate improved anticipatory awareness for safety during bathing and dressing  Skilled Therapeutic Interventions/Progress Updates:    Pt sitting up in w/c, eager to take a shower. No c/o pain throughout session. She did report she had a migraine this morning but that it has resolved. Pt completed all functional mobility with close supervision needing intermittent cues to attend to LUE including placement on walker splint and removal of hand once sitting.  Sit to stand and ambulation of about 10 feet to shower.  Stand to sit on shower bench then rotated in seated position.  Pt bathed UB with only min assist to donn wash mitt to left hand and safe handling of large soap bottle.  Pt needed close supervision during LB bathing during standing for pericare.  After pt dried off, she returned to w/c and attempted to donn deoderant with left wrist still strapped in to walker splint needing multiple question cues to facilitate left sided attention.  Pt donned shirt without using hemi strategy which she has already received training on and needed mod assist to successfully thread LUE.  Pt donned underwear and pants with min assist also needing Vcs to initiate hemi strategy and attend to left side of body.  Pt donned socks with increased time and supervision.  Required total assist to donn left  aircast and shoe for time management purposes.  Pt requesting to blowdry hair and completed with cues in order to stay on task due to pt significantly internally distracted.  Call bell in reach, seat alarm on at end of session.  Therapy Documentation Precautions:  Precautions Precautions: Fall Precaution Comments: BP <150, L hemi, L inattention Restrictions Weight Bearing Restrictions: No   Therapy/Group: Individual Therapy  Amie Critchley 06/10/2021, 2:08 PM

## 2021-06-11 ENCOUNTER — Inpatient Hospital Stay (HOSPITAL_COMMUNITY): Payer: Medicaid Other

## 2021-06-11 DIAGNOSIS — M7989 Other specified soft tissue disorders: Secondary | ICD-10-CM

## 2021-06-11 MED ORDER — TRAMADOL HCL 50 MG PO TABS
50.0000 mg | ORAL_TABLET | Freq: Three times a day (TID) | ORAL | Status: DC | PRN
  Administered 2021-06-12 – 2021-06-16 (×3): 50 mg via ORAL
  Filled 2021-06-11 (×3): qty 1

## 2021-06-11 NOTE — Progress Notes (Signed)
RN called because patient was "shaking" and had "stuttering speech".  Upon my arrival patient is a bit tremulous and initially had some stuttering speech.  As we had conversation she began to calm and had clear speech and was no longer tremulous.  She stated that she was concerned about the test they performed on her arm this afternoon.  She also endorses that she has a migraine HA.  Staff and daughter agree patient is back to her normal self.    She is still a bit tearful and states that she is worried about not sleeping well tonight.    BP 129/98  HR 85  RR 18  O2 sat 99% on RA

## 2021-06-11 NOTE — Progress Notes (Signed)
Speech Language Pathology Weekly Progress and Session Note  Patient Details  Name: Monica Miranda MRN: [id number] Date of Birth: 1960-08-09  Beginning of progress report period: 05/31/2021 End of progress report period: 06/11/2021  Today's Date: 06/11/2021 SLP Individual Time: 1305-1400 SLP Individual Time Calculation (min): 55 min  Short Term Goals: Week 1: SLP Short Term Goal 1 (Week 1): Patient will demonstrate sustained attention to functional tasks for 10 minute duration with min A verbal redirection SLP Short Term Goal 1 - Progress (Week 1): Progressing toward goal SLP Short Term Goal 2 (Week 1): Patient will utilize external memory aids to recall new, daily information with min A verbal cues SLP Short Term Goal 2 - Progress (Week 1): Met SLP Short Term Goal 3 (Week 1): Patient will orient x4 with min A cues SLP Short Term Goal 3 - Progress (Week 1): Met SLP Short Term Goal 4 (Week 1): Patient will complete complex problem solving tasks with min A verbal cues SLP Short Term Goal 4 - Progress (Week 1): Progressing toward goal    New Short Term Goals: Week 2: SLP Short Term Goal 1 (Week 2): STG=LTG's (ELOS: planned discharge 1/12)  Weekly Progress Updates:  Patient met 2 of 4 STG's and is making progress towards the two goals she did not meet (sustained attention and complex problem solving). She has demonstrated improved orientation to time, place, situation as well as improved recall of therapy and nursing staff's names as well as therapy tasks and strategies she has been taught. She also continues to be impulsive, very verbose, and very distractible. She will continues to improve with continued SLP intervention.   Intensity: Minumum of 1-2 x/day, 30 to 90 minutes Frequency: 3 to 5 out of 7 days Duration/Length of Stay: 1/12 Treatment/Interventions: Cognitive remediation/compensation;Environmental controls;Internal/external aids;Cueing hierarchy;Functional tasks;Patient/family  education;Therapeutic Activities   Daily Session  Skilled Therapeutic Interventions: Patient seen for skilled ST session focused on cognitive function goals. Patient's daughter and two of her grand kids were present in room when SLP arrived but they all left before session started. Patient was sitting on edge of bed and alert and ready for therapy. She asked SLP "What brain games do you have today?" SLP introduced a novel card game ([id number]) involving making quick mathematical computations (adding and subtracting) and problem solving. Patient demonstrated improved accuracy with repeated trials, first requiring mod-maxA verbal and demonstration cues but improving to min-modA towards end. About halfway through session, patient reported that her head was starting to hurt and that the focus/concentration she had to use for the game was triggering a migraine. SLP then engaged patient in unstructured conversation. She continues to be very verbose, does not make adequate eye contact, does not pause or allow for turn taking and does not demonstrate awareness to listener's interest in what she is saying. (Unsure if this is her typical personality or this is new). SLP provided moderate frequency of verbal cues to redirect throughout conversation. She was left in bed at end of session with all needs within reach. She continues to benefit from skilled SLP intervention to maximize cognitive function goals prior to discharge.     General    Pain Pain Assessment Pain Scale: 0-10 Pain Score: 0-No pain Faces Pain Scale: Hurts a little bit Pain Type: Acute pain Pain Location: Head Pain Orientation: Anterior Pain Descriptors / Indicators: Discomfort Pain Onset: On-going Pain Intervention(s): Repositioned  Therapy/Group: Individual Therapy  Sonia Baller, MA, CCC-SLP Speech Therapy

## 2021-06-11 NOTE — Progress Notes (Signed)
Physical Therapy Session Note  Patient Details  Name: Monica Miranda MRN: [id number] Date of Birth: 08-31-1960  Today's Date: 06/11/2021 PT Individual Time: 0998-3382 PT Individual Time Calculation (min): 55 min   Short Term Goals: Week 1:  PT Short Term Goal 1 (Week 1): Patient will complete bed mobility with supervision PT Short Term Goal 1 - Progress (Week 1): Met PT Short Term Goal 2 (Week 1): Patient will complete sit <> stand with MinA consistently PT Short Term Goal 2 - Progress (Week 1): Met PT Short Term Goal 3 (Week 1): Patient will ambulate >20 ft with LRAD and MinA PT Short Term Goal 3 - Progress (Week 1): Met PT Short Term Goal 4 (Week 1): Patient will improve BBS by >/= 7pts PT Short Term Goal 4 - Progress (Week 1): Not met Week 2:  PT Short Term Goal 1 (Week 2): STG = LTG due to ELOS  Skilled Therapeutic Interventions/Progress Updates:   Received pt sitting EOB on phone with daughter. Pt agreeable to PT treatment and did not c/o pain during session. Session with emphasis on functional mobility/transfers, generalized strengthening, dynamic standing balance/coordination, gait training, and improved activity tolerance. Donned L aircast and shoes with max A and pt transferred bed<>WC stand<>pivot with RW and CGA. Pt hyper-focused on sequencing and cues and talks through each step an requires significantly increased time for all mobility. CSW present briefly during session to discuss scheduling family education prior to D/C. Pt transported to/from room in [facility] total A for time management purposes. Sit<>stands with RW and CGA throughout session and pt ambulated 141ft with RW and min A. Pt ambulates with varying speeds, starting off with too quick of a speed then stopping for extended standing rest break to "re-adjust" and then ambulating very slowly remainder of distance. Pt with 2 instances of anterior LOB due to L inattention, getting foot stuck behind her and pt attempting to continue  walking. Pt requires cues for sequencing/technique specifically when turning, most likely due to visual deficits. Pt then performed seated BLE strengthening only on Nustep at workload 1 for 5 minutes for a total of 289 steps with emphasis on cardiovascular endurance - LUE support on pillow for comfort. Stand<>pivot Nustep<>WC with RW and CGA. Concluded session with pt sitting in WC, needs within reach, and seatbelt alarm on.   Therapy Documentation Precautions:  Precautions Precautions: Fall Precaution Comments: BP <150, L hemi, L inattention Restrictions Weight Bearing Restrictions: No  Therapy/Group: Individual Therapy Alfonse Alpers PT, DPT   06/11/2021, 7:33 AM

## 2021-06-11 NOTE — Progress Notes (Signed)
PROGRESS NOTE   Subjective/Complaints: Continues to have migraines but she feels that the NAC helps. She was told by the night nurse that this was only ordered daily.   ROS: Patient denies fever, rash, sore throat, +headache, +constipation, +photosensitivity, +muscle cramps in legs, +Nausea from magnesium, +nausea associated with her migraines.    Objective:   No results found. No results for input(s): WBC, HGB, HCT, PLT in the last 72 hours.   No results for input(s): NA, K, CL, CO2, GLUCOSE, BUN, CREATININE, CALCIUM in the last 72 hours.    Intake/Output Summary (Last 24 hours) at 06/11/2021 1118 Last data filed at 06/11/2021 1103 Gross per 24 hour  Intake 832 ml  Output --  Net 832 ml     Pressure Injury 05/25/21 Sacrum Medial;Lower Stage 1 -  Intact skin with non-blanchable redness of a localized area usually over a bony prominence. (Active)  05/25/21 2115  Location: Sacrum  Location Orientation: Medial;Lower  Staging: Stage 1 -  Intact skin with non-blanchable redness of a localized area usually over a bony prominence.  Wound Description (Comments):   Present on Admission: Yes    Physical Exam: Vital Signs Blood pressure (!) 96/51, pulse 72, temperature 98.9 F (37.2 C), resp. rate 18, height 5\' 1"  (1.549 m), weight 44.2 kg, SpO2 96 %. Gen: no distress, normal appearing, sitting upright and appears to be in less pain HEENT: oral mucosa pink and moist, NCAT Cardio: Bradycardic Chest: normal effort, normal rate of breathing Abd: soft, non-distended Ext: left hand swelling.  Psych: Pt's affect is appropriate. Pt is cooperative, Hyperverbose Skin: Clean and intact without signs of breakdown Neuro:  pt is alert and oriented to person, place. Poor insight into deficits. Left central 7, right gaze preference. Mild dysarthria. Poor concentration. LUE 1 to 1+/5. LLE 1+ to 2/5 HE, KE and 0/5 distally. Decreased LT left  arm and leg. DTR's 3+ LLE and LUE with early extensor and flexor tone respectively Musculoskeletal: Full ROM, No pain with AROM or PROM in the neck, trunk, or extremities. Posture appropriate  GU: some incontinent episodes    Assessment/Plan: 1. Functional deficits which require 3+ hours per day of interdisciplinary therapy in a comprehensive inpatient rehab setting. Physiatrist is providing close team supervision and 24 hour management of active medical problems listed below. Physiatrist and rehab team continue to assess barriers to discharge/monitor patient progress toward functional and medical goals  Care Tool:  Bathing  Bathing activity did not occur: Safety/medical concerns (no time/declined) Body parts bathed by patient: Right arm, Left arm, Chest, Abdomen, Front perineal area, Buttocks, Right upper leg, Left upper leg, Right lower leg, Left lower leg, Face         Bathing assist Assist Level: Contact Guard/Touching assist     Upper Body Dressing/Undressing Upper body dressing   What is the patient wearing?: Button up shirt    Upper body assist Assist Level: Moderate Assistance - Patient 50 - 74%    Lower Body Dressing/Undressing Lower body dressing      What is the patient wearing?: Pants, Underwear/pull up     Lower body assist Assist for lower body dressing: Minimal Assistance - Patient >  75%     Toileting Toileting Toileting Activity did not occur Landscape architect and hygiene only): N/A (no void or bm)  Toileting assist Assist for toileting: Minimal Assistance - Patient > 75%     Transfers Chair/bed transfer  Transfers assist     Chair/bed transfer assist level: Contact Guard/Touching assist     Locomotion Ambulation   Ambulation assist      Assist level: Minimal Assistance - Patient > 75% Assistive device: Walker-rolling Max distance: 166ft   Walk 10 feet activity   Assist     Assist level: Minimal Assistance - Patient >  75% Assistive device: Walker-rolling   Walk 50 feet activity   Assist Walk 50 feet with 2 turns activity did not occur: Safety/medical concerns  Assist level: Minimal Assistance - Patient > 75% Assistive device: Walker-rolling    Walk 150 feet activity   Assist Walk 150 feet activity did not occur: Safety/medical concerns  Assist level: Minimal Assistance - Patient > 75% Assistive device: Walker-rolling    Walk 10 feet on uneven surface  activity   Assist Walk 10 feet on uneven surfaces activity did not occur: Safety/medical concerns         Wheelchair     Assist Is the patient using a wheelchair?: Yes Type of Wheelchair: Manual    Wheelchair assist level: Dependent - Patient 0% Max wheelchair distance: 150    Wheelchair 50 feet with 2 turns activity    Assist        Assist Level: Dependent - Patient 0%   Wheelchair 150 feet activity     Assist      Assist Level: Dependent - Patient 0%   Blood pressure (!) 96/51, pulse 72, temperature 98.9 F (37.2 C), resp. rate 18, height 5\' 1"  (1.549 m), weight 44.2 kg, SpO2 96 %.  Medical Problem List and Plan: 1.  Left-sided weakness functional deficits secondary to right thalamic/basal ganglia/frontal temporal ICH in the setting of hypertension as well as cocaine use             -patient may shower             -ELOS/Goals: 18-24 days, min assist with PT, OT, sup/min SLP             -left WFO, PRAFO's ordered  Continue CIR  -discussed timeline of healing from [place]  TC scheduled with me 1/23  Encouraged on her participation despite headache  Discussed benefits of water therapy outaptient in conjunction with her neuro-rehab 2.  Impaired mobility: continue Antiembolism stockings, thigh (TED hose) Bilateral lower extremities             -antiplatelet therapy: N/A 3.Headache: Increase topamax to 100mg  BID. CT head stable- discussed with patient. Continue magnesium. Check level tomorrow.  4. Anxiety:  Check vitamin D level tomorrow. Provide emotional support, Therapeutic rec consulted to provide information on bringing in her pet dogs for support.              -antipsychotic agents: N/A 5. Neuropsych: This patient is capable of making decisions on her own behalf. 6. Skin/Wound Care: Routine skin checks 7. Fluids/Electrolytes/Nutrition: encourage fluids  -check labs 12/26 8.  Hypertension.  d/c lisinopril.  Monitor with increased mobility 9.  Seizure prophylaxis.  Decrease keppra to 250 mg twice daily 10.  Hyperlipidemia.  Statin held given ICH. 11.  History of polysubstance abuse.  Provide counseling 12.  History of COPD/tobacco use.  Monitor oxygen saturations every shift. 13. Constipation:             -  good results with sorbitol overnight 14. Decreased appetite: edited diet order with her preferences. 15. Low protein levels: discussed good protein sources  16. Transaminitis: Patient may use on med NAC from home- placed order.  17. Constipation: increase magnesium gluconate to 250mg  BID. Resolved. Check magnesium level tomorrow.  18. Right rib pain: rib XR ordered. Discussed that results are normal.  19. Left lower extremity weakness: aircast ordered 20. Cluster headaches: 100% O2 via nonrebreather ordered PRN, discussed with patient and nursing, asked nursing to scheduled fitting of nonrebreather this morning.  21. Nausea: d/c magnesium. Zofran ordered.  22. B12 deficiency: Discussed B12 level is elevated and homocysteine level is normal.  23. Neuropathy: B12 and folate levels checked and within normal limits. F/u B6 24. Bradycardia: medications reviewed, continue to monitor TID.  25. Hypotension: medications reviewed, decrease tramadol to q8H prn.  26. Left upper extremity swelling: discussed with daughter bringing in compression garments for her     LOS: 13 days A FACE TO FACE EVALUATION WAS PERFORMED  Careli Luzader P Dennise Raabe 06/11/2021, 11:18 AM

## 2021-06-11 NOTE — Progress Notes (Signed)
LUE venous duplex has been completed.  Results can be found under chart review under CV PROC. 06/11/2021 5:26 PM Annisa Mazzarella RVT, RDMS

## 2021-06-11 NOTE — Progress Notes (Addendum)
Patient ID: Monica Miranda, female   DOB: 1960-06-29, 61 y.o.   MRN: [id number] Theadora Rama and grandchildren here and have scheduled family education for Monday at 1:00-4:00 pm. Pt not happy with daughter in-law for being late getting here today.

## 2021-06-11 NOTE — Progress Notes (Signed)
Patient's daughter came to desk with concerns because patient was shaking. This RN and assigned nurse responded to assess patient. Tremors noted in hand, patient was having pleasant and appropriate conversation with [place] when arm became ataxic and speech changed, repeating syllables, then after a period, patient returned to normal speech. Vitals taken and WNL. Rapid response team responded to assess patient and assigned LPN calling to report incident to on-call MD. Report given about incident to oncoming staff.

## 2021-06-11 NOTE — Progress Notes (Signed)
Occupational Therapy Session Note  Patient Details  Name: Monica Miranda MRN: [id number] Date of Birth: 11-13-1960  Today's Date: 06/11/2021 OT Individual Time: 1416-1530 OT Individual Time Calculation (min): 74 min    Short Term Goals: Week 2:  OT Short Term Goal 1 (Week 2): pt will incorporate LUE into ADLs during 3 consecutive OT sessions with one verbal cue OT Short Term Goal 2 (Week 2): pt will complete one standing grooming task at sink with CGA OT Short Term Goal 3 (Week 2): pt will completing bathing from shower seat with CGA OT Short Term Goal 4 (Week 2): pt will maintain sustained attention during ADLs to facilitate improved anticipatory awareness for safety during bathing and dressing  Skilled Therapeutic Interventions/Progress Updates:    Pt sitting EOB to start session, agreeable to therapy.  She was able to donn her shoes with overall mod assist, excluding tying them.  Therapist provided total assist for this.  She then completed short distance functional mobility without assistive device over to the wheelchair at mod assist level.  Noted decreased heel strike with overcompensation of excessive hip flexion.on the left.  Took pt down to the therapy gym via wheelchair where she transferred to the therapy mat at min assist stand pivot and then to supine at the same level.  Worked on bilateral shoulder flexion with emphasis on maintaining attention to the left hand while holding large yellow therapy ball.  Increased flexor tone noted in the elbow flexors, internal rotators and digit flexors.  Max demonstrational cueing to maintain left hand on the ball with digits extended while working on small movements of shoulder flexion.  Decreased ability to sustain attention on task as she constantly talks out loud to herself and to therapist, instead of putting all of her concentration on the task. Transitioned to sitting with mod assist and worked on sustained activation of elbow extension and  digit extension with use of a tilted stool.  Mod demonstrational cueing to hold stool tilted.  Finished session with work on transition from leaning down on the left forearm to transition back up to sitting with mod demonstrational cueing for 5-6 reps.  Returned to the wheelchair with min assist for transfer stand pivot.  She was returned to the room where she requested to complete toilet transfer with female therapist.  NT in room to assist with this.    Therapy Documentation Precautions:  Precautions Precautions: Fall Precaution Comments: BP <150, L hemi, L inattention Restrictions Weight Bearing Restrictions: No  Pain: Pain Assessment Pain Scale: Faces Faces Pain Scale: Hurts a little bit Pain Type: Acute pain Pain Location: Head Pain Orientation: Anterior Pain Descriptors / Indicators: Discomfort Pain Onset: On-going Pain Intervention(s): Repositioned   Therapy/Group: Individual Therapy  Cola Highfill OTR/L 06/11/2021, 4:01 PM

## 2021-06-12 ENCOUNTER — Inpatient Hospital Stay (HOSPITAL_COMMUNITY): Payer: Medicaid Other

## 2021-06-12 IMAGING — CT CT HEAD W/O CM
4 series · 17 of 47 positions shown, 19 images · non-contrast
Comparison: CT head dated [DATE]

CLINICAL DATA: Headache chronic, follow-up intracranial hemorrhage

EXAM:
CT HEAD WITHOUT CONTRAST
TECHNIQUE: Contiguous axial images were obtained from the base of the skull
through the vertex without intravenous contrast.

[Series 3: head wo · axial · 0.39mm/px · z∈[-110,+10]mm · 7 of 33 slices shown, 9 images]
[im 5/33  brain]
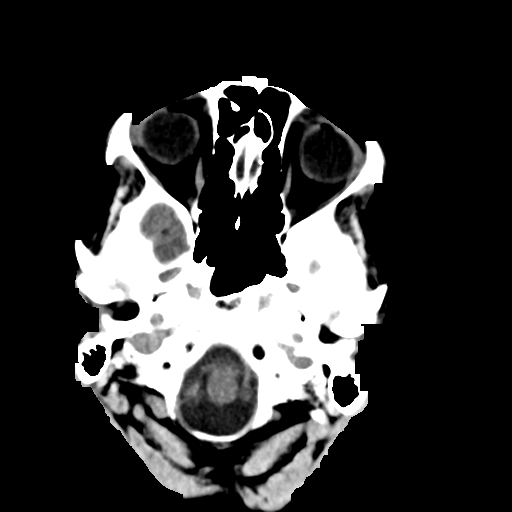
[im 5/33  bone]
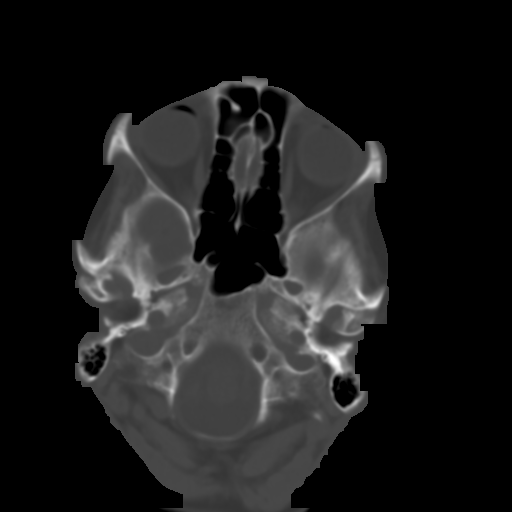
[im 9/33  brain]
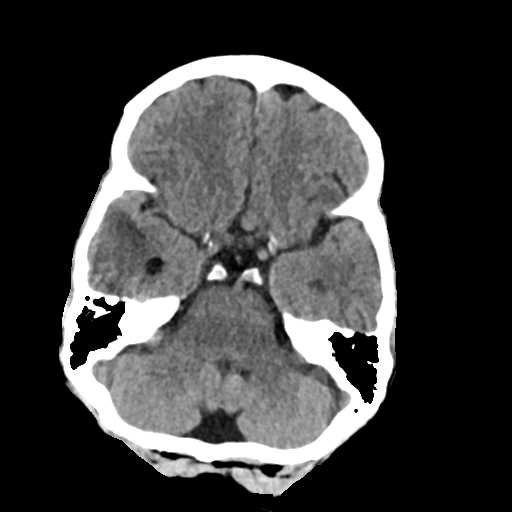
[im 13/33  brain]
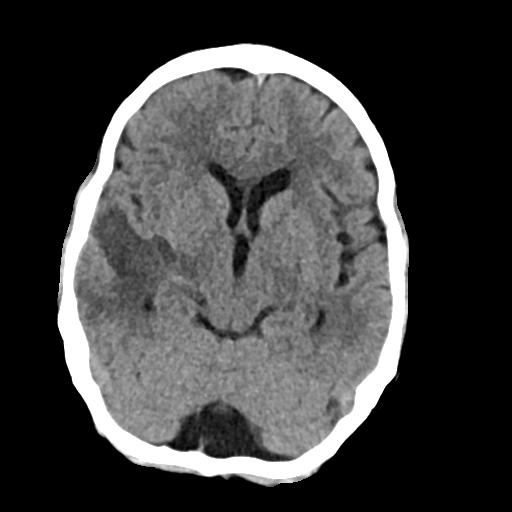
[im 17/33  brain]
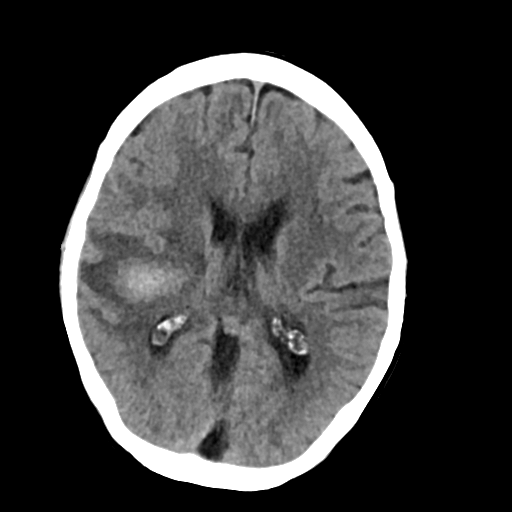
[im 21/33  brain]
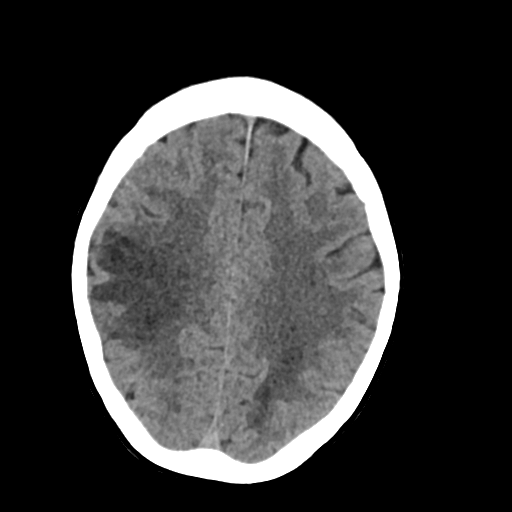
[im 21/33  bone]
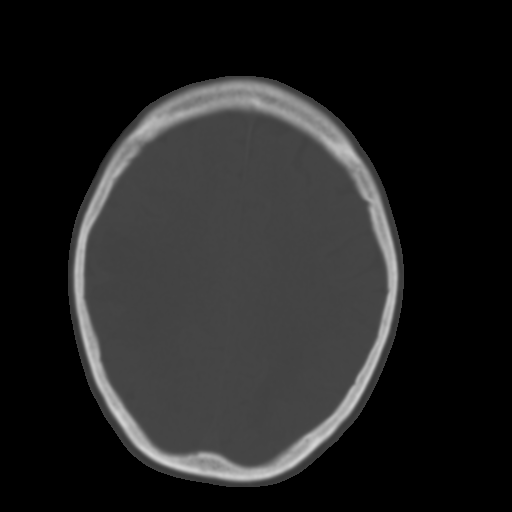
[im 25/33  brain]
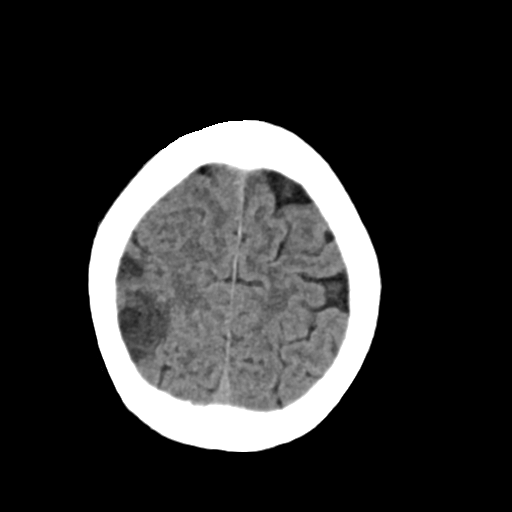
[im 29/33  brain]
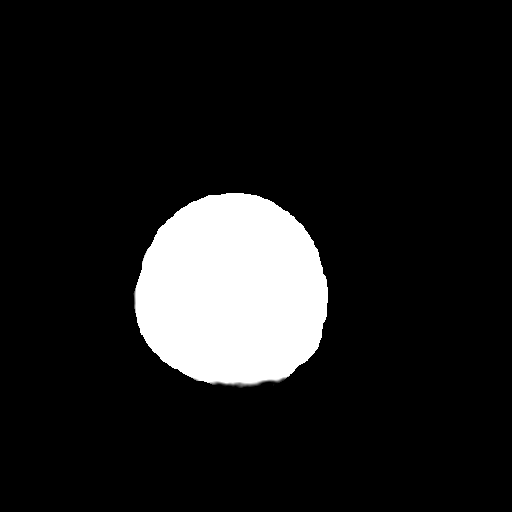

[Series 4: head bone · axial · 0.39mm/px · z∈[-114,-58]mm · 4 of 82 slices shown]
[im 9/82  bone]
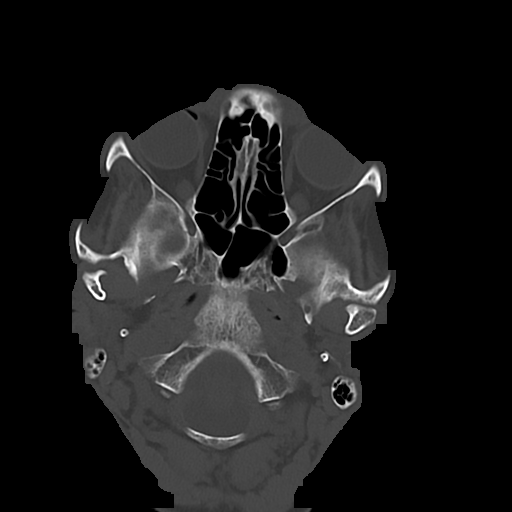
[im 17/82  bone]
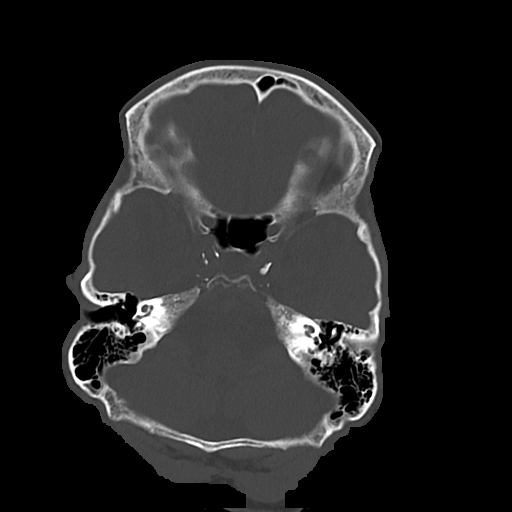
[im 25/82  bone]
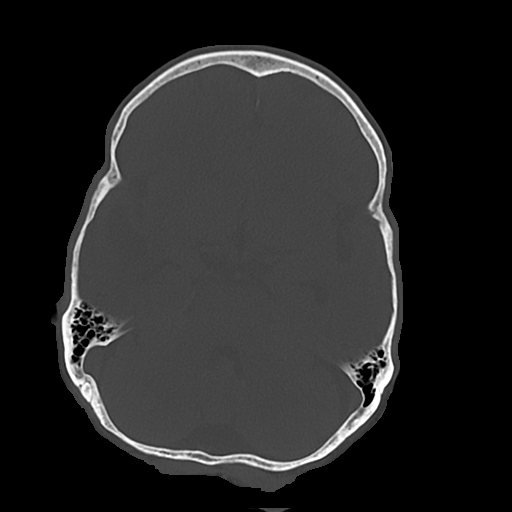
[im 37/82  bone]
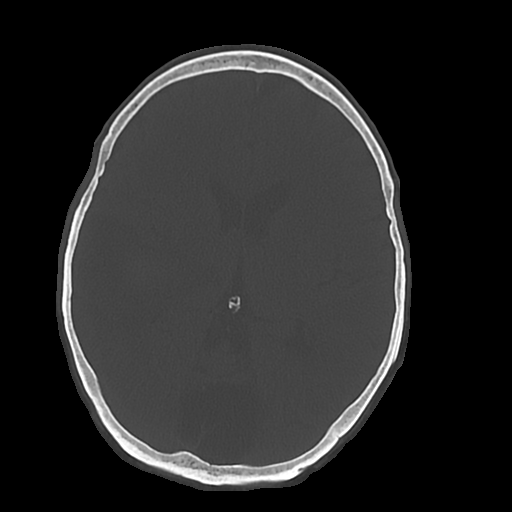

[Series 5: cor soft · coronal · 0.31mm/px · 3 of 64 slices shown]
[im 22/64  brain]
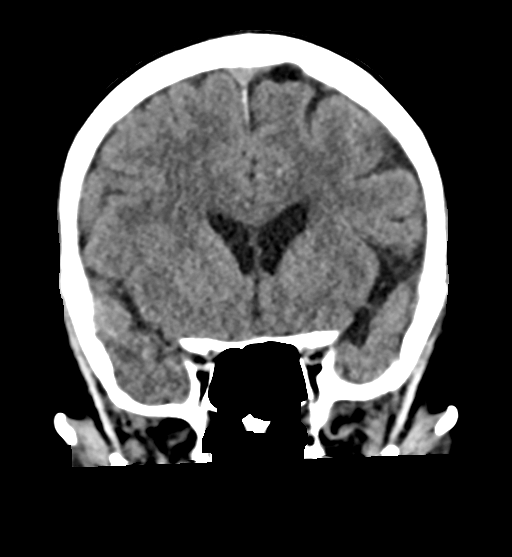
[im 29/64  brain]
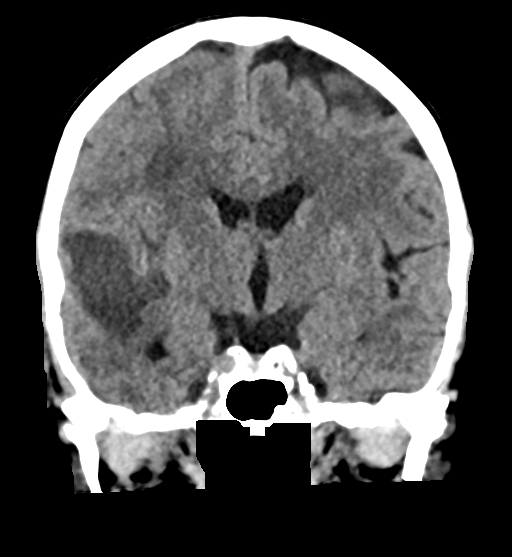
[im 36/64  brain]
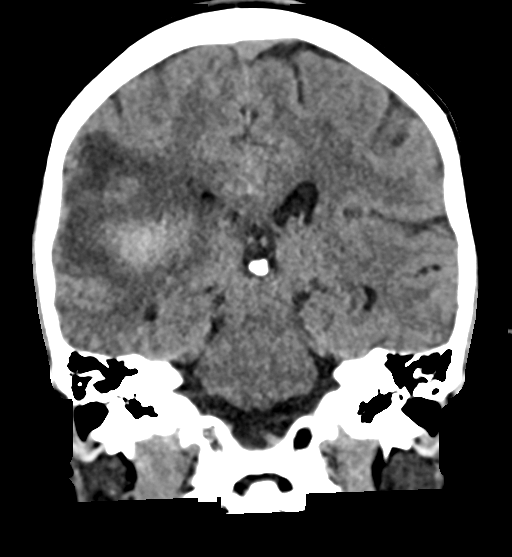

[Series 6: sag soft · sagittal · 0.34mm/px · 3 of 52 slices shown]
[im 18/52  brain]
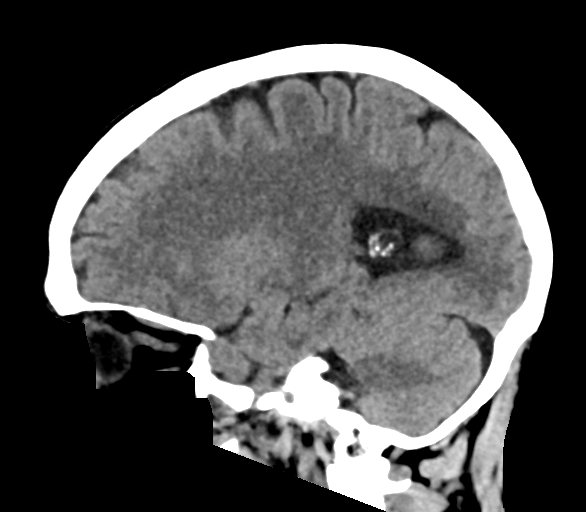
[im 26/52  brain]
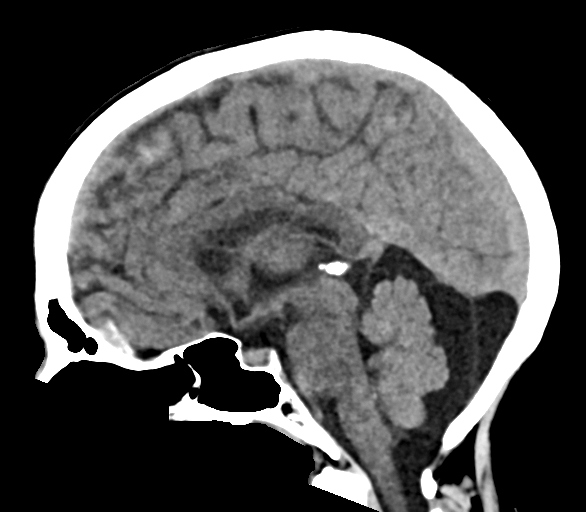
[im 35/52  brain]
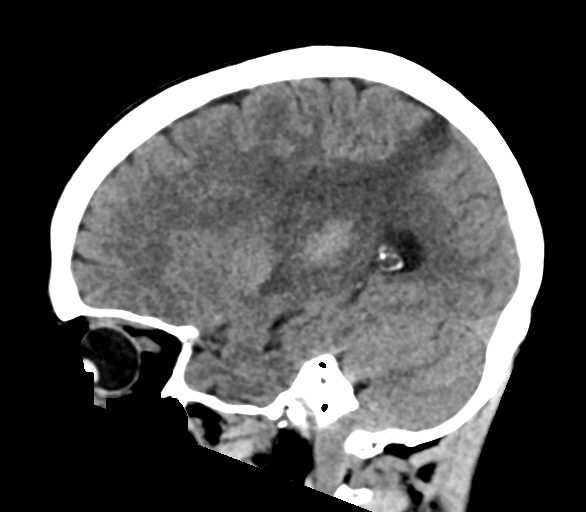

[17 of 47 positions shown; findings below may reference images not displayed]

FINDINGS: Brain: Continuous evolution of the right frontal/temporal
parenchymal hemorrhage now measuring approximately 1.7 x 2.3 cm with
surrounding vasogenic edema. There is interval decrease in the
vasogenic edema and midline shift now approximately 2 mm. No new
hemorrhage. Cisterna magna is again noted.

Vascular: No hyperdense vessel or unexpected calcification.

Skull: Normal. Negative for fracture or focal lesion.

Sinuses/Orbits: No acute finding.

Other: None.
IMPRESSION: 1. Continued evolution of the right frontal/temporal lobe
intraparenchymal hemorrhage with interval decrease in size of the
hematoma as well as surrounding vasogenic edema with decrease in
midline shift as detailed above.
2. No new hemorrhage or large infarct.

## 2021-06-12 MED ORDER — BACLOFEN 10 MG PO TABS
10.0000 mg | ORAL_TABLET | Freq: Three times a day (TID) | ORAL | Status: DC | PRN
  Administered 2021-06-12: 10 mg via ORAL
  Filled 2021-06-12: qty 1

## 2021-06-12 MED ORDER — FLUTICASONE PROPIONATE 50 MCG/ACT NA SUSP
2.0000 | Freq: Every day | NASAL | Status: DC
  Administered 2021-06-12 – 2021-06-13 (×2): 2 via NASAL
  Filled 2021-06-12: qty 16

## 2021-06-12 NOTE — Progress Notes (Signed)
PROGRESS NOTE   Subjective/Complaints:  Having episodes of slurred speech- repeating self and stuttering. But then clears-  Sensation and strength the same- scared of spasms/tremors- "convulsions".     ROS:  Pt denies SOB, abd pain, CP, N/V/C/D, and vision changes- severe photophobia; migraine and sounds sensitivity.    Objective:   CT HEAD WO CONTRAST (5MM)  Result Date: 06/12/2021 CLINICAL DATA:  Headache chronic, follow-up intracranial hemorrhage EXAM: CT HEAD WITHOUT CONTRAST TECHNIQUE: Contiguous axial images were obtained from the base of the skull through the vertex without intravenous contrast. COMPARISON:  CT head dated June 05, 2021 FINDINGS: Brain: Continuous evolution of the right frontal/temporal parenchymal hemorrhage now measuring approximately 1.7 x 2.3 cm with surrounding vasogenic edema. There is interval decrease in the vasogenic edema and midline shift now approximately 2 mm. No new hemorrhage. 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 Vascular: No hyperdense vessel or unexpected calcification. Skull: Normal. Negative for fracture or focal lesion. Sinuses/Orbits: No acute finding. Other: None. IMPRESSION: 1. Continued evolution of the right frontal/temporal lobe intraparenchymal hemorrhage with interval decrease in size of the hematoma as well as surrounding vasogenic edema with decrease in midline shift as detailed above. 2. No new hemorrhage or large infarct. Electronically Signed   By: Larose HiresImran  Ahmed D.O.   On: 06/12/2021 08:57   VAS US UPPER EXTREMITY VENOUS DUPLEX  Result Date: 06/11/2021 UPPER VENOUS STUDY  Patient Name:  Erskine SquibbCHARITY A Munguia  Date of Exam:   06/11/2021 Medical Rec #: [id number]        Accession #:    [id number] Date of Birth: [birth year]        Patient Gender: F Patient Age:   [age] years [facility] Procedure:      VAS US UPPER EXTREMITY VENOUS DUPLEX Referring Phys: Sula SodaKRUTIKA RAULKAR  --------------------------------------------------------------------------------  Indications: Swelling Limitations: Immobility - patient unable to move arm due to stroke. Comparison Study: No previous exams Performing Technologist: Jody Hill RVT, RDMS  Examination Guidelines: A complete evaluation includes B-mode imaging, spectral Doppler, color Doppler, and power Doppler as needed of all accessible portions of each vessel. Bilateral testing is considered an integral part of a complete examination. Limited examinations for reoccurring indications may be performed as noted.  Right Findings: +----------+------------+---------+-----------+----------+-------+  RIGHT      Compressible Phasicity Spontaneous Properties Summary  +----------+------------+---------+-----------+----------+-------+  Subclavian     Full        Yes        Yes                         +----------+------------+---------+-----------+----------+-------+  Left Findings: +----------+------------+---------+-----------+----------+--------------+  LEFT       Compressible Phasicity Spontaneous Properties    Summary      +----------+------------+---------+-----------+----------+--------------+  IJV            Full        Yes        Yes                                +----------+------------+---------+-----------+----------+--------------+  Subclavian     Full  Yes        Yes                                +----------+------------+---------+-----------+----------+--------------+  Axillary       Full        Yes        Yes                                +----------+------------+---------+-----------+----------+--------------+  Brachial       Full        Yes        Yes                                +----------+------------+---------+-----------+----------+--------------+  Radial         Full                                                      +----------+------------+---------+-----------+----------+--------------+  Ulnar                                                     Not visualized  +----------+------------+---------+-----------+----------+--------------+  Cephalic       Full                                                      +----------+------------+---------+-----------+----------+--------------+  Basilic        Full        Yes        Yes                                +----------+------------+---------+-----------+----------+--------------+  *See table(s) above for measurements and observations.  Diagnosing physician: Gerarda FractionJoshua Robins Electronically signed by Gerarda FractionJoshua Robins on 06/11/2021 at 7:02:03 PM.    Final    No results for input(s): WBC, HGB, HCT, PLT in the last 72 hours.   No results for input(s): NA, K, CL, CO2, GLUCOSE, BUN, CREATININE, CALCIUM in the last 72 hours.    Intake/Output Summary (Last 24 hours) at 06/12/2021 1442 Last data filed at 06/12/2021 1405 Gross per 24 hour  Intake 420 ml  Output --  Net 420 ml     Pressure Injury 05/25/21 Sacrum Medial;Lower Stage 1 -  Intact skin with non-blanchable redness of a localized area usually over a bony prominence. (Active)  05/25/21 2115  Location: Sacrum  Location Orientation: Medial;Lower  Staging: Stage 1 -  Intact skin with non-blanchable redness of a localized area usually over a bony prominence.  Wound Description (Comments):   Present on Admission: Yes    Physical Exam: Vital Signs Blood pressure (!) 157/89, pulse 89, temperature 98.5 F (36.9 C), resp. rate 16, height 5\' 1"  (1.549 m), weight 44.2 kg, SpO2 95 %.    General: awake, alert, appropriate,  lights off; repeating self; NAD HENT: conjugate gaze; oropharynx moist CV: regular rate; no JVD Pulmonary: CTA B/L; no W/R/R- good air movement GI: soft, NT, ND, (+)BS Psychiatric: appropriate- anxious about "tremors" Neurological: some stuttering; a few word finding issues noted this AM; no slurred speech heard- hoffman's and 2-3 beats clonus LLE-  Ext: left hand swelling.  Psych: Pt's affect is appropriate. Pt  is cooperative, Hyperverbose Skin: Clean and intact without signs of breakdown Neuro:  pt is alert and oriented to person, place. Poor insight into deficits. Left central 7, right gaze preference. Mild dysarthria. Poor concentration. LUE 1 to 1+/5. LLE 1+ to 2/5 HE, KE and 0/5 distally. Decreased LT left arm and leg. DTR's 3+ LLE and LUE with early extensor and flexor tone respectively Musculoskeletal: Full ROM, No pain with AROM or PROM in the neck, trunk, or extremities. Posture appropriate  GU: some incontinent episodes    Assessment/Plan: 1. Functional deficits which require 3+ hours per day of interdisciplinary therapy in a comprehensive inpatient rehab setting. Physiatrist is providing close team supervision and 24 hour management of active medical problems listed below. Physiatrist and rehab team continue to assess barriers to discharge/monitor patient progress toward functional and medical goals  Care Tool:  Bathing  Bathing activity did not occur: Safety/medical concerns (no time/declined) Body parts bathed by patient: Right arm, Left arm, Chest, Abdomen, Front perineal area, Buttocks, Right upper leg, Left upper leg, Right lower leg, Left lower leg, Face         Bathing assist Assist Level: Contact Guard/Touching assist     Upper Body Dressing/Undressing Upper body dressing   What is the patient wearing?: Button up shirt    Upper body assist Assist Level: Moderate Assistance - Patient 50 - 74%    Lower Body Dressing/Undressing Lower body dressing      What is the patient wearing?: Pants, Underwear/pull up     Lower body assist Assist for lower body dressing: Minimal Assistance - Patient > 75%     Toileting Toileting Toileting Activity did not occur (Clothing management and hygiene only): N/A (no void or bm)  Toileting assist Assist for toileting: Minimal Assistance - Patient > 75%     Transfers Chair/bed transfer  Transfers assist     Chair/bed transfer  assist level: Contact Guard/Touching assist     Locomotion Ambulation   Ambulation assist      Assist level: Minimal Assistance - Patient > 75% Assistive device: Walker-rolling Max distance: 144ft   Walk 10 feet activity   Assist     Assist level: Minimal Assistance - Patient > 75% Assistive device: Walker-rolling   Walk 50 feet activity   Assist Walk 50 feet with 2 turns activity did not occur: Safety/medical concerns  Assist level: Minimal Assistance - Patient > 75% Assistive device: Walker-rolling    Walk 150 feet activity   Assist Walk 150 feet activity did not occur: Safety/medical concerns  Assist level: Minimal Assistance - Patient > 75% Assistive device: Walker-rolling    Walk 10 feet on uneven surface  activity   Assist Walk 10 feet on uneven surfaces activity did not occur: Safety/medical concerns         Wheelchair     Assist Is the patient using a wheelchair?: Yes Type of Wheelchair: Manual    Wheelchair assist level: Dependent - Patient 0% Max wheelchair distance: 150    Wheelchair 50 feet with 2 turns activity    Assist  Assist Level: Dependent - Patient 0%   Wheelchair 150 feet activity     Assist      Assist Level: Dependent - Patient 0%   Blood pressure (!) 157/89, pulse 89, temperature 98.5 F (36.9 C), resp. rate 16, height 5\' 1"  (1.549 m), weight 44.2 kg, SpO2 95 %.  Medical Problem List and Plan: 1.  Left-sided weakness functional deficits secondary to right thalamic/basal ganglia/frontal temporal ICH in the setting of hypertension as well as cocaine use             -patient may shower             -ELOS/Goals: 18-24 days, min assist with PT, OT, sup/min SLP             -left WFO, PRAFO's ordered  Continue CIR  -discussed timeline of healing from ICH  TC scheduled with me 1/23  Encouraged on her participation despite headache  Discussed benefits of water therapy outaptient in conjunction with  her neuro-rehab  Continue CIR- PT, OT and SLP  Head CT (-) for additional bleed/stroke.  2.  Impaired mobility: continue Antiembolism stockings, thigh (TED hose) Bilateral lower extremities             -antiplatelet therapy: N/A 3.Headache: Increase topamax to 100mg  BID. CT head stable- discussed with patient. Continue magnesium. Check level tomorrow.  4. Anxiety: Check vitamin D level tomorrow. Provide emotional support, Therapeutic rec consulted to provide information on bringing in her pet dogs for support.              -antipsychotic agents: N/A 5. Neuropsych: This patient is capable of making decisions on her own behalf. 6. Skin/Wound Care: Routine skin checks 7. Fluids/Electrolytes/Nutrition: encourage fluids  -check labs 12/26 8.  Hypertension.  d/c lisinopril.  Monitor with increased mobility 9.  Seizure prophylaxis.  Decrease keppra to 250 mg twice daily 10.  Hyperlipidemia.  Statin held given ICH. 11.  History of polysubstance abuse.  Provide counseling 12.  History of COPD/tobacco use.  Monitor oxygen saturations every shift. 13. Constipation:             -good results with sorbitol overnight 14. Decreased appetite: edited diet order with her preferences. 15. Low protein levels: discussed good protein sources  16. Transaminitis: Patient may use on med NAC from home- placed order.  17. Constipation: increase magnesium gluconate to 250mg  BID. Resolved. Check magnesium level tomorrow.  18. Right rib pain: rib XR ordered. Discussed that results are normal.  19. Left lower extremity weakness: aircast ordered 20. Cluster headaches: 100% O2 via nonrebreather ordered PRN, discussed with patient and nursing, asked nursing to scheduled fitting of nonrebreather this morning.  21. Nausea: d/c magnesium. Zofran ordered.  22. B12 deficiency: Discussed B12 level is elevated and homocysteine level is normal.  23. Neuropathy: B12 and folate levels checked and within normal limits. F/u B6 24.  Bradycardia: medications reviewed, continue to monitor TID.  25. Hypotension: medications reviewed, decrease tramadol to q8H prn.  26. Left upper extremity swelling: discussed with daughter bringing in compression garments for her 27. Spasticity- think it's the cause of her "convulsions" sounds like spasms more than localized seizure activity- d/w pt- will try Baclofen PRN, not scheduled.  28. Nasal congestion-   1/7- flonase- will order 29. Pt wants nicotine patch when goes home    LOS: 14 days A FACE TO FACE EVALUATION WAS PERFORMED  Sharai Overbay 06/12/2021, 2:42 PM

## 2021-06-12 NOTE — Progress Notes (Signed)
Occupational Therapy Session Note  Patient Details  Name: Monica Miranda MRN: [id number] Date of Birth: Mar 21, 1961  Today's Date: 06/12/2021 OT Individual Time: 1346-1446 OT Individual Time Calculation (min): 60 min    Short Term Goals: Week 2:  OT Short Term Goal 1 (Week 2): pt will incorporate LUE into ADLs during 3 consecutive OT sessions with one verbal cue OT Short Term Goal 2 (Week 2): pt will complete one standing grooming task at sink with CGA OT Short Term Goal 3 (Week 2): pt will completing bathing from shower seat with CGA OT Short Term Goal 4 (Week 2): pt will maintain sustained attention during ADLs to facilitate improved anticipatory awareness for safety during bathing and dressing  Skilled Therapeutic Interventions/Progress Updates:  Pt greeted  on [facility]  agreeable to OT intervention. Session focus on BADL reeducation, functional mobility, dynamic standing balance and decreasing overall caregiver burden. Pt with +BM ( indicated in flowesheet) Pt completed posterior pericare in standing with CGA for standing balance light MINA needed to pull pants up to waist line on L side.pt pivoted back to EOB with MIN A mostly because pt hyper verbose not focusing on transfer.  Pt fixated on various topics today including doppler performed on LUE ( reviewed results with her), and concern that she had what she describes as "convulsions" during the procedure in her LUE/LLE ( did not see any notes about concern for seizure like activity therefore encouraged pt to mention "convulsions" to her MD). Pt did have moments of what I would describe as "stuttering" during recall of past events ( slurring words or perseverating on getting a specific word out) however feel the stuttering is coming from over anxiousness and fixation on level of care/ current situation. Pt was able to redirect with maximal efforts and multiple attempts. Pt completed ambulatory transfer to shower with rw and MINA with pt back to  needing step by step cues to sequence gait pattern. Pt completed shower transfer to shower seat with MIN A with MOD multimodal cues for sequencing body mechanics. Pt completed bathing with overall CGA  needing assist to stand to wash buttock. Pt exited shower in similar manner as previous indicated however this time more resistant to cues (I.e. "why would I move my hand there?) needing max cues to comply with instructions. Overall pt seemed very internally distracted this session, did spend some time discussing coping strategies such as naming what you are grateful for even during times of frustration with current situation, pt seemed to respond well to education.         pt left in w/c in bathroom with RN present assisting pt with getting dressed.                      Therapy Documentation Precautions:  Precautions Precautions: Fall Precaution Comments: BP <150, L hemi, L inattention Restrictions Weight Bearing Restrictions: No  Pain: unrated migraine reported, distraction, redirection and heat used as pain mgmt strategy.     Therapy/Group: Individual Therapy  Barron Schmid 06/12/2021, 3:35 PM

## 2021-06-12 NOTE — Progress Notes (Signed)
Occupational Therapy Session Note  Patient Details  Name: Monica Miranda MRN: [id number] Date of Birth: December 13, 1960  Today's Date: 06/13/2021 OT Individual Time: 8250-5397 OT Individual Time Calculation (min): 44 min    Skilled Therapeutic Interventions/Progress Updates:    Pt greeted in bed, asleep, very difficult to waken but able to do so in time. Pt described her cluster HAs to OT and explained that she's had a couple intense migraines this AM, migraines kept her up last night so she's been sleeping for a good part of the day today. Asked OT if she could have assistance using the restroom. Mod A for donning Lt gripper sock due to bladder urgency and aircast. Pt then ambulated using the RW with CGA and vcs, note that pt verbalizes her sequence of ambulation out loud and takes significantly increased time to ambulate. Min A for lowering clothing and then pt had continent bladder void. Mod A for clothing mgt in standing and then she ambulated to the sink to wash her hands. Assistance needed to remove Lt hand from orthosis and also to dry the Lt hand after it was washed. Pt then returned to bed and was set up to eat food brought in by daughter. Discussed with pt and daughter importance of keeping HOB elevated to help with migraines, also provided her with lavender aromatherapy to address therapeutically. Pt remained in care of daughter, eating early dinner. All needs within reach. Tx focus placed on NMR, standing balance, functional transfers, and ADL retraining.   Therapy Documentation Precautions:  Precautions Precautions: Fall Precaution Comments: BP <150, L hemi, L inattention Restrictions Weight Bearing Restrictions: No Pain: HA, addressed as stated above   ADL: ADL Eating: Supervision/safety Where Assessed-Eating: Chair Grooming: Supervision/safety Upper Body Bathing: Unable to assess Lower Body Bathing: Unable to assess Upper Body Dressing: Moderate assistance, Moderate  cueing Where Assessed-Upper Body Dressing: Edge of bed Lower Body Dressing: Moderate cueing, Moderate assistance Where Assessed-Lower Body Dressing: Edge of bed Toileting: Unable to assess Toilet Transfer: Unable to assess   Therapy/Group: Individual Therapy  Elyan Vanwieren A Payten Hobin 06/13/2021, 3:53 PM

## 2021-06-13 NOTE — Progress Notes (Signed)
Physical Therapy Session Note  Patient Details  Name: Monica Miranda MRN: [id number] Date of Birth: 19-Apr-1961  Today's Date: 06/13/2021 PT Individual Time: 0905-1015 PT Individual Time Calculation (min): 70 min   Short Term Goals:  Week 2:  PT Short Term Goal 1 (Week 2): STG = LTG due to ELOS    Skilled Therapeutic Interventions/Progress Updates:  Pt sitting EOB getting set up with breakfast and meds by LPN.    Pt ate breakfast sitting EOB, after set up, with cues for attending to trash can on R.  Pt verbose, including talking with mouth full, delaying eating.  PT donned socks, L air cast and bil shoes while pt ate.  Stand pivot transfer bed>wc to L, CG asssist  neuromuscular re-education via demo, multimodal cues for seated- L ankle DF with knee extended, knee flexed.  Use of Kinetron in sitting in wc, resistance 60 cm/sec x 25 cycles x 1 targeting gluts, x 35 cycles x 1 targeting quadriceps.  Sit> stand to Rw CGa; cues to bring bil feet under her symmetrically before standing.  Gait training x 150' with turns, orange cone as target to turn L around, CGa.  Speed variable.  Pt c/o HA as she sat down.  She benefitted from eye mask as she rested.  Wc> recliner stand pivot without AD, CG.  At end of session, pt reclined with bil feet elevated, LUE elevated on pillow, seat belt alarm set and needs at hand.     Therapy Documentation Precautions:  Precautions Precautions: Fall Precaution Comments: BP <150, L hemi, L inattention Restrictions Weight Bearing Restrictions: No      Therapy/Group: Individual Therapy  Legaci Tarman 06/13/2021, 10:25 AM

## 2021-06-13 NOTE — Progress Notes (Signed)
PROGRESS NOTE   Subjective/Complaints:  Pt reports needs to void- asking for NT to come help.  Still also waiting for HA meds.   Spasms have been better since Friday/Saturday documentation.   ROS:   Pt denies SOB, abd pain, CP, N/V/C/D, and vision changes HA and photophobia are the same/slightly better Objective:   CT HEAD WO CONTRAST (5MM)  Result Date: 06/12/2021 CLINICAL DATA:  Headache chronic, follow-up intracranial hemorrhage EXAM: CT HEAD WITHOUT CONTRAST TECHNIQUE: Contiguous axial images were obtained from the base of the skull through the vertex without intravenous contrast. COMPARISON:  CT head dated June 05, 2021 FINDINGS: Brain: Continuous evolution of the right frontal/temporal parenchymal hemorrhage now measuring approximately 1.7 x 2.3 cm with surrounding vasogenic edema. There is interval decrease in the vasogenic edema and midline shift now approximately 2 mm. No new hemorrhage. 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 Vascular: No hyperdense vessel or unexpected calcification. Skull: Normal. Negative for fracture or focal lesion. Sinuses/Orbits: No acute finding. Other: None. IMPRESSION: 1. Continued evolution of the right frontal/temporal lobe intraparenchymal hemorrhage with interval decrease in size of the hematoma as well as surrounding vasogenic edema with decrease in midline shift as detailed above. 2. No new hemorrhage or large infarct. Electronically Signed   By: Keane Police D.O.   On: 06/12/2021 08:57   VAS [place] UPPER EXTREMITY VENOUS DUPLEX  Result Date: 06/11/2021 UPPER VENOUS STUDY  Patient Name:  DERIYAH PESSIN  Date of Exam:   06/11/2021 Medical Rec #: [id number]        Accession #:    [id number] Date of Birth: 1961/05/30        Patient Gender: F Patient Age:   61 years Exam Location:  [facility] Procedure:      VAS [place] UPPER EXTREMITY VENOUS DUPLEX Referring Phys: Leeroy Cha  --------------------------------------------------------------------------------  Indications: Swelling Limitations: Immobility - patient unable to move arm due to stroke. Comparison Study: No previous exams Performing Technologist: Jody Hill RVT, RDMS  Examination Guidelines: A complete evaluation includes B-mode imaging, spectral Doppler, color Doppler, and power Doppler as needed of all accessible portions of each vessel. Bilateral testing is considered an integral part of a complete examination. Limited examinations for reoccurring indications may be performed as noted.  Right Findings: +----------+------------+---------+-----------+----------+-------+  RIGHT      Compressible Phasicity Spontaneous Properties Summary  +----------+------------+---------+-----------+----------+-------+  Subclavian     Full        Yes        Yes                         +----------+------------+---------+-----------+----------+-------+  Left Findings: +----------+------------+---------+-----------+----------+--------------+  LEFT       Compressible Phasicity Spontaneous Properties    Summary      +----------+------------+---------+-----------+----------+--------------+  IJV            Full        Yes        Yes                                +----------+------------+---------+-----------+----------+--------------+  Subclavian  Full        Yes        Yes                                +----------+------------+---------+-----------+----------+--------------+  Axillary       Full        Yes        Yes                                +----------+------------+---------+-----------+----------+--------------+  Brachial       Full        Yes        Yes                                +----------+------------+---------+-----------+----------+--------------+  Radial         Full                                                      +----------+------------+---------+-----------+----------+--------------+  Ulnar                                                     Not visualized  +----------+------------+---------+-----------+----------+--------------+  Cephalic       Full                                                      +----------+------------+---------+-----------+----------+--------------+  Basilic        Full        Yes        Yes                                +----------+------------+---------+-----------+----------+--------------+  *See table(s) above for measurements and observations.  Diagnosing physician: Gerarda Fraction Electronically signed by Gerarda Fraction on 06/11/2021 at 7:02:03 PM.    Final    No results for input(s): WBC, HGB, HCT, PLT in the last 72 hours.   No results for input(s): NA, K, CL, CO2, GLUCOSE, BUN, CREATININE, CALCIUM in the last 72 hours.    Intake/Output Summary (Last 24 hours) at 06/13/2021 1119 Last data filed at 06/12/2021 1919 Gross per 24 hour  Intake 660 ml  Output --  Net 660 ml     Pressure Injury 05/25/21 Sacrum Medial;Lower Stage 1 -  Intact skin with non-blanchable redness of a localized area usually over a bony prominence. (Active)  05/25/21 2115  Location: Sacrum  Location Orientation: Medial;Lower  Staging: Stage 1 -  Intact skin with non-blanchable redness of a localized area usually over a bony prominence.  Wound Description (Comments):   Present on Admission: Yes    Physical Exam: Vital Signs Blood pressure 127/85, pulse 69, temperature 98.8 F (37.1 C), resp. rate 14, height 5\' 1"  (1.549 m), weight 44.2 kg, SpO2 97 %.  General: awake, alert, appropriate, laying supine in bed in dark;  NAD HENT: conjugate gaze; oropharynx moist CV: regular rate; no JVD Pulmonary: CTA B/L; no W/R/R- good air movement GI: soft, NT, ND, (+)BS Psychiatric: appropriate- anxious about needing to void Neurological: Alert- speech much more clear; no stuttering heard;  hoffman's and 2-3 beats clonus LLE- light off due to photophobia Ext: left hand swelling.  Psych: Pt's affect is appropriate.  Pt is cooperative, Hyperverbose Skin: Clean and intact without signs of breakdown Neuro:  pt is alert and oriented to person, place. Poor insight into deficits. Left central 7, right gaze preference. Mild dysarthria. Poor concentration. LUE 1 to 1+/5. LLE 1+ to 2/5 HE, KE and 0/5 distally. Decreased LT left arm and leg. DTR's 3+ LLE and LUE with early extensor and flexor tone respectively Musculoskeletal: Full ROM, No pain with AROM or PROM in the neck, trunk, or extremities. Posture appropriate  GU: some incontinent episodes    Assessment/Plan: 1. Functional deficits which require 3+ hours per day of interdisciplinary therapy in a comprehensive inpatient rehab setting. Physiatrist is providing close team supervision and 24 hour management of active medical problems listed below. Physiatrist and rehab team continue to assess barriers to discharge/monitor patient progress toward functional and medical goals  Care Tool:  Bathing  Bathing activity did not occur: Safety/medical concerns (no time/declined) Body parts bathed by patient: Right arm, Left arm, Chest, Abdomen, Front perineal area, Buttocks, Right upper leg, Left upper leg, Right lower leg, Left lower leg, Face         Bathing assist Assist Level: Contact Guard/Touching assist     Upper Body Dressing/Undressing Upper body dressing   What is the patient wearing?: Button up shirt    Upper body assist Assist Level: Moderate Assistance - Patient 50 - 74%    Lower Body Dressing/Undressing Lower body dressing      What is the patient wearing?: Pants, Underwear/pull up     Lower body assist Assist for lower body dressing: Minimal Assistance - Patient > 75%     Toileting Toileting Toileting Activity did not occur (Clothing management and hygiene only): N/A (no void or bm)  Toileting assist Assist for toileting: Minimal Assistance - Patient > 75%     Transfers Chair/bed transfer  Transfers assist     Chair/bed  transfer assist level: Minimal Assistance - Patient > 75%     Locomotion Ambulation   Ambulation assist      Assist level: Minimal Assistance - Patient > 75% Assistive device: Walker-rolling Max distance: 137ft   Walk 10 feet activity   Assist     Assist level: Minimal Assistance - Patient > 75% Assistive device: Walker-rolling   Walk 50 feet activity   Assist Walk 50 feet with 2 turns activity did not occur: Safety/medical concerns  Assist level: Minimal Assistance - Patient > 75% Assistive device: Walker-rolling    Walk 150 feet activity   Assist Walk 150 feet activity did not occur: Safety/medical concerns  Assist level: Minimal Assistance - Patient > 75% Assistive device: Walker-rolling    Walk 10 feet on uneven surface  activity   Assist Walk 10 feet on uneven surfaces activity did not occur: Safety/medical concerns         Wheelchair     Assist Is the patient using a wheelchair?: Yes Type of Wheelchair: Manual    Wheelchair assist level: Dependent - Patient 0% Max wheelchair distance: 150    Wheelchair 50 feet with 2 turns  activity    Assist        Assist Level: Dependent - Patient 0%   Wheelchair 150 feet activity     Assist      Assist Level: Dependent - Patient 0%   Blood pressure 127/85, pulse 69, temperature 98.8 F (37.1 C), resp. rate 14, height 5\' 1"  (1.549 m), weight 44.2 kg, SpO2 97 %.  Medical Problem List and Plan: 1.  Left-sided weakness functional deficits secondary to right thalamic/basal ganglia/frontal temporal ICH in the setting of hypertension as well as cocaine use             -patient may shower             -ELOS/Goals: 18-24 days, min assist with PT, OT, sup/min SLP             -left WFO, PRAFO's ordered  Continue CIR  -discussed timeline of healing from [place]  TC scheduled with me 1/23  Encouraged on her participation despite headache  Discussed benefits of water therapy outaptient in  conjunction with her neuro-rehab  Continue CIR- PT, OT and SLP  Went over (-) CT for new stroke.  2.  Impaired mobility: continue Antiembolism stockings, thigh (TED hose) Bilateral lower extremities             -antiplatelet therapy: N/A 3.Headache: Increase topamax to 100mg  BID. CT head stable- discussed with patient. Continue magnesium. Check level tomorrow.  4. Anxiety: Check vitamin D level tomorrow. Provide emotional support, Therapeutic rec consulted to provide information on bringing in her pet dogs for support.              -antipsychotic agents: N/A 5. Neuropsych: This patient is capable of making decisions on her own behalf. 6. Skin/Wound Care: Routine skin checks 7. Fluids/Electrolytes/Nutrition: encourage fluids  -check labs 12/26 8.  Hypertension.  d/c lisinopril.  Monitor with increased mobility  1/8- BP controlled- con't regimen 9.  Seizure prophylaxis.  Decrease keppra to 250 mg twice daily 10.  Hyperlipidemia.  Statin held given ICH. 11.  History of polysubstance abuse.  Provide counseling 12.  History of COPD/tobacco use.  Monitor oxygen saturations every shift. 13. Constipation:             -good results with sorbitol overnight 14. Decreased appetite: edited diet order with her preferences. 15. Low protein levels: discussed good protein sources  16. Transaminitis: Patient may use on med NAC from home- placed order.  17. Constipation: increase magnesium gluconate to 250mg  BID. Resolved. Check magnesium level tomorrow.  18. Right rib pain: rib XR ordered. Discussed that results are normal.  19. Left lower extremity weakness: aircast ordered 20. Cluster headaches: 100% O2 via nonrebreather ordered PRN, discussed with patient and nursing, asked nursing to scheduled fitting of nonrebreather this morning.   1/8- says it hasn't occurred yet.  21. Nausea: d/c magnesium. Zofran ordered.  22. B12 deficiency: Discussed B12 level is elevated and homocysteine level is normal.  23.  Neuropathy: B12 and folate levels checked and within normal limits. F/u B6 24. Bradycardia: medications reviewed, continue to monitor TID.  25. Hypotension: medications reviewed, decrease tramadol to q8H prn.  26. Left upper extremity swelling: discussed with daughter bringing in compression garments for her 27. Spasticity- think it's the cause of her "convulsions" sounds like spasms more than localized seizure activity- d/w pt- will try Baclofen PRN, not scheduled. 1/8- no more spasms episodes- con't prn meds  28. Nasal congestion-   1/7- flonase- will order 29. Pt  wants nicotine patch when goes home    LOS: 15 days A FACE TO FACE EVALUATION WAS PERFORMED  Trenton Verne 06/13/2021, 11:19 AM

## 2021-06-14 ENCOUNTER — Inpatient Hospital Stay (HOSPITAL_COMMUNITY): Payer: Medicaid Other

## 2021-06-14 DIAGNOSIS — I610 Nontraumatic intracerebral hemorrhage in hemisphere, subcortical: Secondary | ICD-10-CM

## 2021-06-14 DIAGNOSIS — R569 Unspecified convulsions: Secondary | ICD-10-CM

## 2021-06-14 MED ORDER — VITAMIN D (ERGOCALCIFEROL) 1.25 MG (50000 UNIT) PO CAPS
50000.0000 [IU] | ORAL_CAPSULE | ORAL | Status: DC
  Administered 2021-06-14: 50000 [IU] via ORAL
  Filled 2021-06-14: qty 1

## 2021-06-14 NOTE — Progress Notes (Signed)
Occupational Therapy Session Note  Patient Details  Name: Monica Miranda MRN: [id number] Date of Birth: 02-Jul-1960  Today's Date: 06/14/2021 OT Individual Time: 5625-6389 OT Individual Time Calculation (min): 51 min  and Today's Date: 06/14/2021 OT Missed Time: 9 Minutes Missed Time Reason: Unavailable (comment);Other (comment) (pt had phone interview at 10 AM) Session 2: 1401-1445 ( 44 mins)  Short Term Goals: Week 2:  OT Short Term Goal 1 (Week 2): pt will incorporate LUE into ADLs during 3 consecutive OT sessions with one verbal cue OT Short Term Goal 2 (Week 2): pt will complete one standing grooming task at sink with CGA OT Short Term Goal 3 (Week 2): pt will completing bathing from shower seat with CGA OT Short Term Goal 4 (Week 2): pt will maintain sustained attention during ADLs to facilitate improved anticipatory awareness for safety during bathing and dressing  Skilled Therapeutic Interventions/Progress Updates:  Session 1: Pt greeted  EOB with DIL present, pt agreeable to OT intervention. Session focus on BADL reeducation, functional mobility, dynamic standing balance and decreasing overall caregiver burden. Pt completed ambulatory toilet transfer with rw with CGA. Pt needed MIN A for toileting needing assist for clothing mgmt on L side. Pt completed ambulatory shower transfer to walkin shower with CGA. Pt complete bathing with overall CGA with pt only needing balance assist when standing.DIL present during shower with education provided on level of assist for bathing/ dressing and education provided on encouraging pt to use bathmit on L hand to promote sensory reintegration to L hand.  Pt exited shower in similar manner, where pt completed dressing from w/c. MIN A to don [place] shirt d/t time constraints as pt had phone interview at 10 AM. Pt needed MIN A to don pants needing assist to pull pants up on L side. Pt donned socks with set- up assist.               pt left seated in w/c with DIL  present and pt engaging in phone interview.                     Session 2: Pt greeted supine in bed agreeable to OT intervention. Session focus on family training with pts DIL. General education provided on pts overall goal level of CGA for ADLs and ADL transfers. Pt completed supine>sit with supervision. Applied new strap to walker splint and placed yellow tape to outside of strap for pt to better be able to recall which side to unstrap.  Pt completed sit<>stand and stand pivot transfer from EOB>w/c with Rw and CGA. Pt transported to shower room with total A for time mgmt. Pt completed ambulatory transfer from w/c> TTB with rw and CGA, MOD cues for sequencing, rw mgmt and body mechanics d/t novel task. DIL assisted pt out of shower with good carryover of education/ technique demonstrated. Both pt and DIL communicating well together and DIL demo'ed safe transfer technique and assist level. Pt also ambulated to recliner with DIL walking with her. Education provided on trying to mobilize once every hour, general fall prevention strategies and reinforced recommendation that pt needs at least CGA for all mobility tasks on RW. Pt handed off to PT for next session.               Therapy Documentation Precautions:  Precautions Precautions: Fall Precaution Comments: BP <150, L hemi, L inattention Restrictions Weight Bearing Restrictions: No   Pain: no pain reported during session  Therapy/Group: Individual Therapy  Pollyann Glen Sarah D [facility] 06/14/2021, 10:11 AM

## 2021-06-14 NOTE — Progress Notes (Signed)
Physical Therapy Session Note  Patient Details  Name: Monica Miranda MRN: [id number] Date of Birth: [birth year]  Today's Date: 06/14/2021 PT Individual Time: 1450-1530 PT Individual Time Calculation (min): 40 min   Short Term Goals: Week 2:  PT Short Term Goal 1 (Week 2): STG = LTG due to ELOS  Skilled Therapeutic Interventions/Progress Updates:     Direct handoff of care from OT to start session with pt sitting in recliner and daughter in law at bedside. Focused session on family education to prepare for upcoming DC home later this week. Pt denies pain. Donned aircast and tennis shoes with totalA for time - ed to daughter in law on wear schedule and donning/doffing aircast. Sit<>Stand to RW from recliner with CGA and ambulates with CGA and RW from ADL apartment suite to main rehab gym, ~184f. VC for monitoring paced activity, keeping body within walker frame, safety awareness with L foot placement, etc. In rehab gym, focused remainder of time on stair training as pt has 2-3 STE with 2 rails, per daughter in law. Pt completed 8 steps with CGA and 1 rail on R with PT with VC for proper sequencing (ascent with R leading and descend with L leading). Daughter in law educated on guarding technique and how to cue for stairs - completed 8 + 4 + 8 with daughter in law guarding and PT providing supervision assist for safety. Discussed general home safety training, fall prevention strategies, DME rec's, and role of f/u therapies. All questions/concerns addressed from both pt and daughter in law. Pt returned to her room in w/c and assisted to recliner with stand<>pivot transfer and CGA. Remained seated in recliner with safety belt alarm on, LUE supported with pillow, all needs met.   Therapy Documentation Precautions:  Precautions Precautions: Fall Precaution Comments: BP <150, L hemi, L inattention Restrictions Weight Bearing Restrictions: No General:    Therapy/Group: Individual Therapy  Drayden Lukas  P Marjie Chea 06/14/2021, 7:28 AM

## 2021-06-14 NOTE — Progress Notes (Addendum)
Pt requested medication and to use BSC. Pt asked if there was pain meds this nurse could give " For a Migraine that's going to come on strong". This nurse offered Tylenol, pt stated "that's all you can give me?". Offered to set up O2 re breather per order from MD. Pt stated she hasn't been fit for mask, this nurse contacted RT and spoke with RT about the order. RT concerned with using re breather mask for pt when O2 room air is 99% and has Hx of COPD/Tobacco use. RT recommended using Nasal Cannula or Venti-mask instead but monitor with re-breather as patient may not be able to tolerate considering her Hx. Pt sleeping when this nurse entered room to install mask, this nurse decided to wait for Pt request again before administering. Call light within reach, bed alarm active, bed in lowest setting.

## 2021-06-14 NOTE — Progress Notes (Signed)
Patient ID: Monica Miranda, female   DOB: 08-17-1960, 61 y.o.   MRN: [id number] Mandy-daughter in-law is here for family education it is going well according to both. Will change youth rolling walker with regular one since this is what she is using here. Have also added a tub bench to order. Working toward discharge Thursday. Match put in for assist with medications for home. Aware pt will need 24/7 care at discharge.

## 2021-06-14 NOTE — Progress Notes (Signed)
PROGRESS NOTE   Subjective/Complaints: Patient and daughter are concerned about full body spasms and worsening stuttering that started Friday after patient received her vascular ultrasound. Discussed getting EEG  ROS:   Pt denies SOB, abd pain, CP, N/V/C/D, and vision changes HA and photophobia are the same/slightly better Objective:   No results found. No results for input(s): WBC, HGB, HCT, PLT in the last 72 hours.   No results for input(s): NA, K, CL, CO2, GLUCOSE, BUN, CREATININE, CALCIUM in the last 72 hours.    Intake/Output Summary (Last 24 hours) at 06/14/2021 1203 Last data filed at 06/13/2021 1842 Gross per 24 hour  Intake 420 ml  Output --  Net 420 ml     Pressure Injury 05/25/21 Sacrum Medial;Lower Stage 1 -  Intact skin with non-blanchable redness of a localized area usually over a bony prominence. (Active)  05/25/21 2115  Location: Sacrum  Location Orientation: Medial;Lower  Staging: Stage 1 -  Intact skin with non-blanchable redness of a localized area usually over a bony prominence.  Wound Description (Comments):   Present on Admission: Yes    Physical Exam: Vital Signs Blood pressure 140/85, pulse 60, temperature 97.9 F (36.6 C), resp. rate 16, height 5\' 1"  (1.549 m), weight 44.2 kg, SpO2 97 %.     General: awake, alert, appropriate, laying supine in bed in dark;  NAD HENT: conjugate gaze; oropharynx moist CV: regular rate; no JVD Pulmonary: CTA B/L; no W/R/R- good air movement GI: soft, NT, ND, (+)BS Psychiatric: appropriate- anxious about needing to void Neurological: Alert- speech much more clear; still stih some stuttering;  hoffman's and 2-3 beats clonus LLE- light off due to photophobia Ext: left hand swelling.  Psych: Pt's affect is appropriate. Pt is cooperative, Hyperverbose Skin: Clean and intact without signs of breakdown Neuro:  pt is alert and oriented to person, place. Poor  insight into deficits. Left central 7, right gaze preference. Mild dysarthria. Poor concentration. LUE 1 to 1+/5. LLE 1+ to 2/5 HE, KE and 0/5 distally. Decreased LT left arm and leg. DTR's 3+ LLE and LUE with early extensor and flexor tone respectively Musculoskeletal: Full ROM, No pain with AROM or PROM in the neck, trunk, or extremities. Posture appropriate  GU: some incontinent episodes    Assessment/Plan: 1. Functional deficits which require 3+ hours per day of interdisciplinary therapy in a comprehensive inpatient rehab setting. Physiatrist is providing close team supervision and 24 hour management of active medical problems listed below. Physiatrist and rehab team continue to assess barriers to discharge/monitor patient progress toward functional and medical goals  Care Tool:  Bathing  Bathing activity did not occur: Safety/medical concerns (no time/declined) Body parts bathed by patient: Right arm, Left arm, Chest, Abdomen, Front perineal area, Buttocks, Right upper leg, Left upper leg, Right lower leg, Left lower leg, Face         Bathing assist Assist Level: Contact Guard/Touching assist     Upper Body Dressing/Undressing Upper body dressing   What is the patient wearing?: Pull over shirt    Upper body assist Assist Level: Minimal Assistance - Patient > 75%    Lower Body Dressing/Undressing Lower body dressing  What is the patient wearing?: Pants, Underwear/pull up     Lower body assist Assist for lower body dressing: Minimal Assistance - Patient > 75%     Toileting Toileting Toileting Activity did not occur Press photographer and hygiene only): N/A (no void or bm)  Toileting assist Assist for toileting: Minimal Assistance - Patient > 75%     Transfers Chair/bed transfer  Transfers assist     Chair/bed transfer assist level: Contact Guard/Touching assist     Locomotion Ambulation   Ambulation assist      Assist level: Minimal Assistance -  Patient > 75% Assistive device: Walker-rolling Max distance: 125ft   Walk 10 feet activity   Assist     Assist level: Minimal Assistance - Patient > 75% Assistive device: Walker-rolling   Walk 50 feet activity   Assist Walk 50 feet with 2 turns activity did not occur: Safety/medical concerns  Assist level: Minimal Assistance - Patient > 75% Assistive device: Walker-rolling    Walk 150 feet activity   Assist Walk 150 feet activity did not occur: Safety/medical concerns  Assist level: Minimal Assistance - Patient > 75% Assistive device: Walker-rolling    Walk 10 feet on uneven surface  activity   Assist Walk 10 feet on uneven surfaces activity did not occur: Safety/medical concerns         Wheelchair     Assist Is the patient using a wheelchair?: Yes Type of Wheelchair: Manual    Wheelchair assist level: Dependent - Patient 0% Max wheelchair distance: 150    Wheelchair 50 feet with 2 turns activity    Assist        Assist Level: Dependent - Patient 0%   Wheelchair 150 feet activity     Assist      Assist Level: Dependent - Patient 0%   Blood pressure 140/85, pulse 60, temperature 97.9 F (36.6 C), resp. rate 16, height 5\' 1"  (1.549 m), weight 44.2 kg, SpO2 97 %.  Medical Problem List and Plan: 1.  Left-sided weakness functional deficits secondary to right thalamic/basal ganglia/frontal temporal ICH in the setting of hypertension as well as cocaine use             -patient may shower             -ELOS/Goals: 18-24 days, min assist with PT, OT, sup/min SLP             -left WFO, PRAFO's ordered  Continue CIR  -discussed timeline of healing from ICH  TC scheduled with me 1/23  Encouraged on her participation despite headache  Discussed benefits of water therapy outaptient in conjunction with her neuro-rehab  Continue CIR- PT, OT and SLP  Went over (-) CT for new stroke.  2.  Impaired mobility: continue Antiembolism stockings, thigh  (TED hose) Bilateral lower extremities             -antiplatelet therapy: N/A 3.Headache: Increase topamax to 100mg  BID. CT head stable- discussed that midline shift has improved to 16mm. Discontinue magnesium. Check level tomorrow.  4. Anxiety:  Provide emotional support, Therapeutic rec consulted to provide information on bringing in her pet dogs for support.              -antipsychotic agents: N/A 5. Neuropsych: This patient is capable of making decisions on her own behalf. 6. Skin/Wound Care: Routine skin checks 7. Fluids/Electrolytes/Nutrition: encourage fluids  -check labs 12/26 8.  Hypertension.  d/c lisinopril.  Monitor with increased mobility  1/8- BP  controlled- con't regimen 9.  Seizure prophylaxis.  Decrease keppra to 250 mg twice daily 10.  Hyperlipidemia.  Statin held given ICH. 11.  History of polysubstance abuse.  Provide counseling 12.  History of COPD/tobacco use.  Monitor oxygen saturations every shift. 13. Constipation:             -good results with sorbitol overnight 14. Decreased appetite: edited diet order with her preferences. 15. Low protein levels: discussed good protein sources  16. Transaminitis: Patient may use on med NAC from home- placed order.  17. Constipation: increase magnesium gluconate to 250mg  BID. Resolved. Check magnesium level tomorrow.  18. Right rib pain: rib XR ordered. Discussed that results are normal.  19. Left lower extremity weakness: aircast ordered 20. Cluster headaches: 100% O2 via nonrebreather ordered PRN, discussed with patient and nursing, asked nursing to scheduled fitting of nonrebreather this morning.   1/8- says it hasn't occurred yet.  21. Nausea: d/c magnesium. Zofran ordered.  22. B12 deficiency: Discussed B12 level is elevated and homocysteine level is normal.  23. Neuropathy: B12 and folate levels checked and within normal limits. F/u B6 24. Bradycardia: medications reviewed, continue to monitor TID.  25. Hypotension:  medications reviewed, decrease tramadol to q8H prn.  26. Left upper extremity swelling: discussed with daughter bringing in compression garments for her 27. Spasticity- think it's the cause of her "convulsions" sounds like spasms more than localized seizure activity- d/w pt- will try Baclofen PRN, not scheduled. 1/8- no more spasms episodes- con't prn meds  28. Nasal congestion-   1/7- flonase- will order 29. Pt wants nicotine patch when goes home 30. Vitamin D deficiency: start ergocalciferol 50,000U once per week for 7 weeks.     LOS: 16 days A FACE TO FACE EVALUATION WAS PERFORMED  3/7 P Uriyah Massimo 06/14/2021, 12:03 PM

## 2021-06-14 NOTE — Procedures (Signed)
Patient Name: Monica Miranda  MRN: [id number]  Epilepsy Attending: Lora Havens  Referring Physician/Provider: Izora Ribas, MD Date: 06/14/2021 Duration: 22.24 mins  Patient history: 61yo F with right thalamic/basal ganglia/frontal temporal ICH in the setting of hypertension as well as cocaine use. EEG to evaluate for seizure  Level of alertness: Awake  AEDs during EEG study: TPM  Technical aspects: This EEG study was done with scalp electrodes positioned according to the 10-20 International system of electrode placement. Electrical activity was acquired at a sampling rate of 500Hz  and reviewed with a high frequency filter of 70Hz  and a low frequency filter of 1Hz . EEG data were recorded continuously and digitally stored.   Description: The posterior dominant rhythm consists of 10 Hz activity of moderate voltage (25-35 uV) seen predominantly in posterior head regions, asymmetric ( right<left) and reactive to eye opening and eye closing. EEG showed continuous 3 to 6 Hz theta-delta slowing in right hemisphere. Hyperventilation and photic stimulation were not performed.     ABNORMALITY - Continuous slow, right hemisphere - Background asymmetry, right<left   IMPRESSION: This study is suggestive of cortical dysfunction arising from right hemisphere likely secondary to underlying structural abnormality/ICH. No seizures or epileptiform discharges were seen throughout the recording.  Isamar Wellbrock Barbra Sarks

## 2021-06-14 NOTE — Progress Notes (Signed)
EEG done at beside. No skin breakdown noted. Results pending.

## 2021-06-14 NOTE — Progress Notes (Signed)
Speech Language Pathology Daily Session Note  Patient Details  Name: Monica Miranda MRN: [id number] Date of Birth: 07/24/1960  Today's Date: 06/14/2021 SLP Individual Time: 1302-1400 SLP Individual Time Calculation (min): 58 min  Short Term Goals: Week 2: SLP Short Term Goal 1 (Week 2): STG=LTG's (ELOS: planned discharge 1/12)  Skilled Therapeutic Interventions: Skilled ST services focused on education and cognitive skills. Pt's daughter-in-law was present for today's session. Pt's daughter-in-law supported pragmatic skills appear to be baseline (turn taking and verbose language), however after assessment of subsections from Right Hemisphere Burns Assessment she supported changes in especially prosody. Pt scored WFL in inferencing and metaphorical language subsections, however scored 1 out 5 in prosody sections with no awareness of deficits. Pt appeared to brush off deficits as unessential skills. SLP provided education to pt and daughter-in-law that changes in pragmatics is a common deficit in right CVA and it can impact relationships due to limited ability to gage tone in conversation. Pt continued to appear dismissive and continued education is recommended. Pt scored WFL on three subsection on Cognistat (memory, orientation and calculations) which were noted as mild deficits upon admission to CIR. SLP provided education pertaining to need for assistance in pragmatic skills and higher level cognitive skills in complex problem solving/memory/awareness. All questions answered to satisfaction. Pt was left in room with family, call bell within reach and bed alarm set. SLP recommends to continue skilled services.    Pain Pain Assessment Pain Score: 0-No pain  Therapy/Group: Individual Therapy  Rilan Eiland  [facility] 06/14/2021, 3:25 PM

## 2021-06-14 NOTE — Progress Notes (Signed)
Occupational Therapy Discharge Summary  Patient Details  Name: Monica Miranda MRN: [id number] Date of Birth: 1960/10/23     Patient has met 6 of 8 long term goals due to improved activity tolerance, improved balance, postural control, ability to compensate for deficits, improved attention, improved awareness, and improved coordination.  Pt has made great progress during her LOS d/t improvements in pts balance, strength, endurance, functional mobility and overall activity tolerance. Pt Carter at overall CGA- MIN A for ADLs. Pts daughter in law has been present during sessions and has demo'ed competency with assisting pt during ADLS and functional mobility. Pt continues to present with L inattention, impaired functional awareness of deficits, impaired LUE sensation and proprioception. Patient to discharge at [facility] Assist level.  Patient's care partner is independent to provide the necessary physical and cognitive assistance at discharge.    Reasons goals not met: pt requires MIN A for UB/LB dressing/toileting tasks d/t impaired attention/ awareness, depth perception/sensation/ proprioception as well as L inattention.   Recommendation:  Patient will benefit from ongoing skilled OT services in home health setting to continue to advance functional skills in the area of BADL and Reduce care partner burden.  Equipment: TTB  Reasons for discharge: treatment goals met and discharge from hospital  Patient/family agrees with progress made and goals achieved: Yes  OT Discharge Precautions/Restrictions  Precautions Precautions: Fall Precaution Comments: L inattention, L hemi Restrictions Weight Bearing Restrictions: No    ADL ADL Eating: Set up Where Assessed-Eating: Chair Grooming: Supervision/safety Where Assessed-Grooming: Sitting at sink Upper Body Bathing: Contact guard Where Assessed-Upper Body Bathing: Shower Lower Body Bathing: Contact guard Where Assessed-Lower Body  Bathing: Shower Upper Body Dressing: Minimal assistance Where Assessed-Upper Body Dressing: Wheelchair Lower Body Dressing: Minimal assistance Where Assessed-Lower Body Dressing: Wheelchair Toileting: Minimal assistance Where Assessed-Toileting: Glass blower/designer: Therapist, music Method: Counselling psychologist: Energy manager: Metallurgist Method: Optometrist: Facilities manager: Curator Method: Print production planner with back ADL Comments: pt completes ADLS with CGA- MIN A needing assist d/t L sided weakness, inattention, impaired LUE sensation, visual deficits and decreased safety awareness Vision Baseline Vision/History: 1 Wears glasses (readers) Patient Visual Report: Blurring of vision;Other (comment) (photophobia) Perception  Perception: Impaired Inattention/Neglect: Does not attend to left side of body;Other (comment) (but greatly improved from eval) Praxis Praxis: Impaired Praxis Impairment Details: Initiation;Motor planning Cognition Overall Cognitive Status: Impaired/Different from baseline Arousal/Alertness: Awake/alert Orientation Level: Oriented X4 Year: 2023 Month: January Day of Week: Correct Attention: Sustained;Selective Focused Attention: Appears intact Sustained Attention: Impaired Sustained Attention Impairment: Verbal basic;Functional basic Selective Attention: Impaired Selective Attention Impairment: Functional complex;Verbal complex (pragmatics vs personality) Memory: Impaired Memory Impairment: Decreased recall of new information Immediate Memory Recall: Sock;Blue;Bed Memory Recall Sock: Without Cue Memory Recall Blue: Without Cue Memory Recall Bed: Without Cue Awareness: Appears intact Awareness Impairment: Anticipatory impairment;Other (comment) (can verbally anticipate the  awareness but can't functionally follow through) Problem Solving: Impaired Problem Solving Impairment: Verbal complex Executive Function: Sequencing;Organizing Sequencing: Impaired Sequencing Impairment: Verbal complex;Functional complex Organizing: Impaired Organizing Impairment: Verbal complex Behaviors: Perseveration Safety/Judgment: Impaired Comments: impulsive at times Sensation Sensation Light Touch: Impaired Detail Light Touch Impaired Details: Impaired LUE;Impaired LLE Hot/Cold: Appears Intact Proprioception: Impaired Detail Proprioception Impaired Details: Impaired LLE;Impaired LUE Additional Comments: Absent distally on LLE Coordination Gross Motor Movements are Fluid and Coordinated: No Fine Motor Movements are Fluid and Coordinated: No Coordination and Movement  Description: Moderate L hemi that has improved since evaluation due to both motor strength and awareness Heel Shin Test: equal ROM B, but slightly dysmetric L LE (improved since date of eval) Motor  Motor Motor: Hemiplegia Mobility  Bed Mobility Bed Mobility: Sit to Supine;Supine to Sit Supine to Sit: Supervision/Verbal cueing Sit to Supine: Supervision/Verbal cueing Transfers Sit to Stand: Contact Guard/Touching assist Stand to Sit: Contact Guard/Touching assist  Trunk/Postural Assessment  Cervical Assessment Cervical Assessment: Within Functional Limits Thoracic Assessment Thoracic Assessment: Within Functional Limits Lumbar Assessment Lumbar Assessment: Within Functional Limits Postural Control Postural Control: Deficits on evaluation (delayed, mild)  Balance Balance Balance Assessed: Yes Static Sitting Balance Static Sitting - Balance Support: Feet unsupported;No upper extremity supported Static Sitting - Level of Assistance: 7: Independent Dynamic Sitting Balance Dynamic Sitting - Balance Support: Feet supported;No upper extremity supported Dynamic Sitting - Level of Assistance: 5: Stand by  assistance Static Standing Balance Static Standing - Balance Support: Bilateral upper extremity supported Static Standing - Level of Assistance: 5: Stand by assistance Dynamic Standing Balance Dynamic Standing - Balance Support: During functional activity Dynamic Standing - Level of Assistance: 4: Min assist Extremity/Trunk Assessment RUE Assessment RUE Assessment: Within Functional Limits LUE Assessment LUE Assessment: Within Functional Limits Active Range of Motion (AROM) Comments: WFL General Strength Comments: shoulder flexion: LUE: 3+/5 RUE: 4/5 elbow flexion/extension LUE: 3+/5 RUE: 4/5 LUE Body System: Neuro Brunstrum levels for arm and hand: Arm;Hand Brunstrum level for arm: Stage V Relative Independence from Synergy Brunstrum level for hand: Stage VI Isolated joint movements LUE AROM (degrees) Overall AROM Left Upper Extremity: Within functional limits for tasks assessed LUE Overall AROM Comments: WFL for ADL participation Left Shoulder Flexion: 180 Degrees Left Shoulder ABduction: 180 Degrees Left Composite Finger Extension:  (100%) Left Composite Finger Flexion:  (100%)   Precious Haws 06/14/2021, 3:16 PM

## 2021-06-15 NOTE — Progress Notes (Signed)
PROGRESS NOTE   Subjective/Complaints: Sleeping this morning.  Appreciate EEG- negative for seizure. Activity consistent with ICH Patient's chart reviewed- No issues reported overnight  ROS:   Pt denies SOB, abd pain, CP, N/V/C/D, and vision changes HA and photophobia are the same/slightly better, +jerking activity of left side Objective:   EEG adult  Result Date: 06/14/2021 Charlsie Quest, MD     06/14/2021  6:06 PM Patient Name: Monica Miranda MRN: [id number] Epilepsy Attending: Charlsie Quest Referring Physician/Provider: Horton Chin, MD Date: 06/14/2021 Duration: 22.24 mins Patient history: 61yo F with right thalamic/basal ganglia/frontal temporal ICH in the setting of hypertension as well as cocaine use. EEG to evaluate for seizure Level of alertness: Awake AEDs during EEG study: TPM Technical aspects: This EEG study was done with scalp electrodes positioned according to the 10-20 International system of electrode placement. Electrical activity was acquired at a sampling rate of 500Hz  and reviewed with a high frequency filter of 70Hz  and a low frequency filter of 1Hz . EEG data were recorded continuously and digitally stored. Description: The posterior dominant rhythm consists of 10 Hz activity of moderate voltage (25-35 uV) seen predominantly in posterior head regions, asymmetric ( right<left) and reactive to eye opening and eye closing. EEG showed continuous 3 to 6 Hz theta-delta slowing in right hemisphere. Hyperventilation and photic stimulation were not performed.   ABNORMALITY - Continuous slow, right hemisphere - Background asymmetry, right<left IMPRESSION: This study is suggestive of cortical dysfunction arising from right hemisphere likely secondary to underlying structural abnormality/ICH. No seizures or epileptiform discharges were seen throughout the recording. Priyanka O Yadav   No results for input(s): WBC, HGB, HCT,  PLT in the last 72 hours.   No results for input(s): NA, K, CL, CO2, GLUCOSE, BUN, CREATININE, CALCIUM in the last 72 hours.   No intake or output data in the 24 hours ending 06/15/21 1051    Pressure Injury 05/25/21 Sacrum Medial;Lower Stage 1 -  Intact skin with non-blanchable redness of a localized area usually over a bony prominence. (Active)  05/25/21 2115  Location: Sacrum  Location Orientation: Medial;Lower  Staging: Stage 1 -  Intact skin with non-blanchable redness of a localized area usually over a bony prominence.  Wound Description (Comments):   Present on Admission: Yes    Physical Exam: Vital Signs Blood pressure 133/85, pulse (!) 58, temperature 98.1 F (36.7 C), temperature source Oral, resp. rate 16, height 5\' 1"  (1.549 m), weight 44.2 kg, SpO2 96 %.     General: awake, alert, appropriate, laying supine in bed in dark;  NAD HENT: conjugate gaze; oropharynx moist CV: bradycardia no JVD Pulmonary: CTA B/L; no W/R/R- good air movement GI: soft, NT, ND, (+)BS Psychiatric: appropriate- anxious about needing to void Neurological: Alert- speech much more clear; still stih some stuttering;  hoffman's and 2-3 beats clonus LLE- light off due to photophobia Ext: left hand swelling.  Psych: Pt's affect is appropriate. Pt is cooperative, Hyperverbose Skin: Clean and intact without signs of breakdown Neuro:  pt is alert and oriented to person, place. Poor insight into deficits. Left central 7, right gaze preference. Mild dysarthria. Poor concentration. LUE 1 to 1+/5.  LLE 1+ to 2/5 HE, KE and 0/5 distally. Decreased LT left arm and leg. DTR's 3+ LLE and LUE with early extensor and flexor tone respectively Musculoskeletal: Full ROM, No pain with AROM or PROM in the neck, trunk, or extremities. Posture appropriate  GU: some incontinent episodes    Assessment/Plan: 1. Functional deficits which require 3+ hours per day of interdisciplinary therapy in a comprehensive inpatient  rehab setting. Physiatrist is providing close team supervision and 24 hour management of active medical problems listed below. Physiatrist and rehab team continue to assess barriers to discharge/monitor patient progress toward functional and medical goals  Care Tool:  Bathing  Bathing activity did not occur: Safety/medical concerns (no time/declined) Body parts bathed by patient: Right arm, Left arm, Chest, Abdomen, Front perineal area, Buttocks, Right upper leg, Left upper leg, Right lower leg, Left lower leg, Face         Bathing assist Assist Level: Contact Guard/Touching assist     Upper Body Dressing/Undressing Upper body dressing   What is the patient wearing?: Pull over shirt    Upper body assist Assist Level: Minimal Assistance - Patient > 75%    Lower Body Dressing/Undressing Lower body dressing      What is the patient wearing?: Pants, Underwear/pull up     Lower body assist Assist for lower body dressing: Minimal Assistance - Patient > 75%     Toileting Toileting Toileting Activity did not occur (Clothing management and hygiene only): N/A (no void or bm)  Toileting assist Assist for toileting: Minimal Assistance - Patient > 75%     Transfers Chair/bed transfer  Transfers assist     Chair/bed transfer assist level: Contact Guard/Touching assist     Locomotion Ambulation   Ambulation assist      Assist level: Minimal Assistance - Patient > 75% Assistive device: Walker-rolling Max distance: 14980ft   Walk 10 feet activity   Assist     Assist level: Minimal Assistance - Patient > 75% Assistive device: Walker-rolling   Walk 50 feet activity   Assist Walk 50 feet with 2 turns activity did not occur: Safety/medical concerns  Assist level: Minimal Assistance - Patient > 75% Assistive device: Walker-rolling    Walk 150 feet activity   Assist Walk 150 feet activity did not occur: Safety/medical concerns  Assist level: Minimal Assistance  - Patient > 75% Assistive device: Walker-rolling    Walk 10 feet on uneven surface  activity   Assist Walk 10 feet on uneven surfaces activity did not occur: Safety/medical concerns         Wheelchair     Assist Is the patient using a wheelchair?: Yes Type of Wheelchair: Manual    Wheelchair assist level: Dependent - Patient 0% Max wheelchair distance: 150    Wheelchair 50 feet with 2 turns activity    Assist        Assist Level: Dependent - Patient 0%   Wheelchair 150 feet activity     Assist      Assist Level: Dependent - Patient 0%   Blood pressure 133/85, pulse (!) 58, temperature 98.1 F (36.7 C), temperature source Oral, resp. rate 16, height 5\' 1"  (1.549 m), weight 44.2 kg, SpO2 96 %.  Medical Problem List and Plan: 1.  Left-sided weakness functional deficits secondary to right thalamic/basal ganglia/frontal temporal ICH in the setting of hypertension as well as cocaine use             -patient may shower             -  ELOS/Goals: 18-24 days, min assist with PT, OT, sup/min SLP             -left WFO, PRAFO's ordered  Continue CIR  -discussed timeline of healing from ICH  TC scheduled with me 1/23  Encouraged on her participation despite headache  Discussed benefits of water therapy outaptient in conjunction with her neuro-rehab  Continue CIR- PT, OT and SLP  Went over (-) CT for new stroke.  2.  Impaired mobility: continue Antiembolism stockings, thigh (TED hose) Bilateral lower extremities             -antiplatelet therapy: N/A 3.Headache: Increase topamax to 100mg  BID. CT head stable- discussed that midline shift has improved to 27mm. Discontinue magnesium. Check level tomorrow.  4. Anxiety:  Provide emotional support, Therapeutic rec consulted to provide information on bringing in her pet dogs for support.              -antipsychotic agents: N/A 5. Neuropsych: This patient is capable of making decisions on her own behalf. 6. Skin/Wound  Care: Routine skin checks 7. Fluids/Electrolytes/Nutrition: encourage fluids  -check labs 12/26 8.  Hypertension.  d/c lisinopril.  Monitor with increased mobility  1/8- BP controlled- con't regimen 9.  Seizure prophylaxis.  Decrease keppra to 250 mg twice daily 10.  Hyperlipidemia.  Statin held given ICH. 11.  History of polysubstance abuse.  Provide counseling 12.  History of COPD/tobacco use.  Monitor oxygen saturations every shift. 13. Constipation:             -good results with sorbitol overnight 14. Decreased appetite: edited diet order with her preferences. 15. Low protein levels: discussed good protein sources  16. Transaminitis: Patient may use on med NAC from home- placed order.  17. Constipation: increase magnesium gluconate to 250mg  BID. Resolved. Check magnesium level tomorrow.  18. Right rib pain: rib XR ordered. Discussed that results are normal.  19. Left lower extremity weakness: aircast ordered 20. Cluster headaches: 100% O2 via nonrebreather ordered PRN, discussed with patient and nursing, asked nursing to scheduled fitting of nonrebreather this morning.   1/8- says it hasn't occurred yet.  21. Nausea: d/c magnesium. Zofran ordered.  22. B12 deficiency: Discussed B12 level is elevated and homocysteine level is normal.  23. Neuropathy: B12 and folate levels checked and within normal limits. F/u B6 24. Bradycardia: medications reviewed, continue to monitor TID.  25. Hypotension: medications reviewed, decrease tramadol to q8H prn.  26. Left upper extremity swelling: discussed with daughter bringing in compression garments for her 27. Spasticity- think it's the cause of her "convulsions" sounds like spasms more than localized seizure activity- d/w pt- will try Baclofen PRN, not scheduled. 1/8- no more spasms episodes- con't prn meds  28. Nasal congestion-   1/7- flonase- will order 29. Active smoker: Pt wants nicotine patch when goes home, will prescribe 30. Vitamin D  deficiency: start ergocalciferol 50,000U once per week for 7 weeks.  31. Jerking activity of left side: Neurology note reviewed: EEG negative for seizure, more consistent with ICH     LOS: 17 days A FACE TO FACE EVALUATION WAS PERFORMED  Keison Glendinning P Kohner Orlick 06/15/2021, 10:51 AM

## 2021-06-15 NOTE — Progress Notes (Signed)
Physical Therapy Session Note  Patient Details  Name: Monica Miranda MRN: [id number] Date of Birth: [birth year]  Today's Date: 06/15/2021 PT Individual Time: 0902-0956 PT Individual Time Calculation (min): 54 min   Short Term Goals: Week 1:  PT Short Term Goal 1 (Week 1): Patient will complete bed mobility with supervision PT Short Term Goal 1 - Progress (Week 1): Met PT Short Term Goal 2 (Week 1): Patient will complete sit <> stand with MinA consistently PT Short Term Goal 2 - Progress (Week 1): Met PT Short Term Goal 3 (Week 1): Patient will ambulate >20 ft with LRAD and MinA PT Short Term Goal 3 - Progress (Week 1): Met PT Short Term Goal 4 (Week 1): Patient will improve BBS by >/= 7pts PT Short Term Goal 4 - Progress (Week 1): Not met Week 2:  PT Short Term Goal 1 (Week 2): STG = LTG due to ELOS  Skilled Therapeutic Interventions/Progress Updates:  Pt received supine in bed, denied pain and was agreeable to PT. Pt hyperverbal throughout session and perseverated on performing stair training. Emphasis of session on stair training and preparation to DC home. Pt performed supine <>sit EOB independently and donned L aircast and shoes at EOB w/total A. Pt performed sit <>stand to RW and stand pivot to [facility] w/CGA. Pt transported to main gym w/total A for time management and performed sit <>stands from [facility] throughout session w/CGA. Pt ascended/descended 4 6" steps using R HR 5 times w/CGA, frequently asking therapist if she could "do it again please". Pt quizzed therapist on proper technique to ascend/descend, stating "Christian told me how to do it but I want to know if you know how to do it". Educated pt on ascending w/RLE and descending w/LLE, which pt approved of. Pt demonstrated mix of step-to and step-through pattern while ascending and alternated which leg she lead with. After 5th repetition, encouraged pt to attempt a full flight of steps in the stairwell, which she agreed to. Pt transported to  stairwell w/total A for time management and performed sit <>stand from [facility] w/HHA and ascended/descended 12 6" steps w/R handrail and CGA. Pt moved very slowly and was guarded, asking therapist if it were okay "if I breathe heavy", ensured pt that therapist encouraged her to breathe.   Pt transported back to room w/total A for time management and was left seated in WC in room, all needs in reach.   Therapy Documentation Precautions:  Precautions Precautions: Fall Precaution Comments: BP <150, L hemi, L inattention Restrictions Weight Bearing Restrictions: No  Therapy/Group: Individual Therapy Cruzita Lederer Zackrey Dyar, PT, DPT  06/15/2021, 7:45 AM

## 2021-06-15 NOTE — Progress Notes (Signed)
Physical Therapy Session Note  Patient Details  Name: Monica Miranda MRN: [id number] Date of Birth: 1961-01-05  Today's Date: 06/15/2021 PT Individual Time: 1400-1430 PT Individual Time Calculation (min): 30 min   Short Term Goals: Week 3:     Skilled Therapeutic Interventions/Progress Updates:    Pt sitting in recliner at start of session and unable to identify of PT walking into room after knocking due to L inattention. Pt enthusiastic about therapy session. CGA with sit<>stand w/ RW L hand splint and able to verbalize steps needed to complete task. Pt ambulated CGA w/ RW L hand splint from room to ortho gym ~50 ft to practice car transfers. Pt demonstrated difficulty with turns and continuously looked down while ambulating. Pt able to complete car transfer with CGA and VC for sequencing. When pt persisted to show therapist her way of doing car transfer, she unknowingly turned herself around x3 before completing same transfer technique of backing into seat. Pt voiced understanding of proper technique after 3 trial. Pt ambulated from car and down hallway ~150 ft back to room CGA w/ RW L hand splint. Pt unaware of walker and foot placement during turns. L inattention and safety awareness during ambulation continues to be biggest deficits. Pt left in recliner w/ posey belt/alarm and all needs met.  Therapy Documentation Precautions:  Precautions Precautions: Fall Precaution Comments: BP <150, L hemi, L inattention Restrictions Weight Bearing Restrictions: No General:      Therapy/Group: Individual Therapy  Lucile Shutters, SPT  06/15/2021, 3:47 PM

## 2021-06-15 NOTE — Discharge Summary (Signed)
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 is inadequate for assessing PA pressure.  3. Left atrial size  was moderately dilated.  4. There is systolic anterior motion of the mitral valve with eccentric moderate to severe mitral regurgitation as a result. 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 No evidence of mitral stenosis.  5. The aortic valve is tricuspid. Aortic valve regurgitation is not visualized. No aortic stenosis is present.  6. The inferior vena cava is normal in size with greater than 50% respiratory variability, suggesting right atrial pressure of 3 mmHg. Comparison(s): No prior Echocardiogram. 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 There is systolic anterior motion of the mitral valve with a late systolic MR jet. Difficult to fully appreciate on current study, but based on splay  and color in left atrium, suspect moderate-severe. Would consider limited repeat echo in the future after medical therapy to see if [facility] and MR improves. FINDINGS  Left Ventricle: Left ventricular ejection fraction, by estimation, is 60 to 65%. The left ventricle has normal function. The left ventricle has no regional wall motion abnormalities. The left ventricular internal cavity size was  normal in size. There is  no left ventricular hypertrophy. Left ventricular diastolic function could not be evaluated. Right Ventricle: The right ventricular size is normal. No increase in right ventricular wall thickness. Right ventricular systolic function is normal. Tricuspid regurgitation signal is inadequate for assessing PA pressure. Left Atrium: Left atrial size was moderately dilated. Right Atrium: Right atrial size was normal in size. Pericardium: There is no evidence of pericardial effusion. Mitral Valve: There is systolic anterior motion of the mitral valve with eccentric moderate to severe mitral regurgitation as a result. 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 No evidence of mitral valve stenosis. Tricuspid Valve: The tricuspid valve is normal in structure. Tricuspid valve regurgitation is trivial. No evidence of tricuspid stenosis. Aortic Valve: The aortic valve is tricuspid. Aortic valve regurgitation is not visualized. No aortic stenosis is present. Pulmonic Valve: The pulmonic valve was not well visualized. Pulmonic valve regurgitation is not visualized. Aorta: The aortic root, ascending aorta, aortic arch and descending aorta are all structurally normal, with no evidence of dilitation or obstruction. Venous: The inferior vena cava is normal in size with greater than 50% respiratory variability, suggesting right atrial pressure of 3 mmHg. IAS/Shunts: The atrial septum is grossly normal.  LEFT VENTRICLE PLAX 2D LVIDd:         4.10 cm LVIDs:         2.70 cm LV PW:         1.00 cm LV IVS:        0.80 cm LVOT diam:     1.90 cm LVOT Area:     2.84 cm  RIGHT VENTRICLE RV Basal diam:  2.60 cm LEFT ATRIUM             Index        RIGHT ATRIUM          Index LA diam:        3.40 cm 2.47 cm/m   RA Area:     8.37 cm LA Vol (A2C):   61.5 ml 44.71 ml/m  RA Volume:   15.40 ml 11.20 ml/m LA Vol (A4C):   61.0 ml 44.34 ml/m LA Biplane Vol: 62.0 ml 45.07 ml/m   AORTA Ao  Root diam: 3.20 cm MITRAL VALVE MV Area (PHT): 3.99 cm     SHUNTS MV Decel Time: 190 msec     Systemic Diam: 1.90 cm MR  Peak grad: 192.1 mmHg MR Vmax:      693.00 cm/s MV E velocity: 79.10 cm/s MV A velocity: 130.00 cm/s MV E/A ratio:  0.61 Jodelle Red MD Electronically signed by Jodelle Red MD Signature Date/Time: 05/27/2021/6:51:05 PM    Final    VAS US CAROTID  Result Date: 05/27/2021 Carotid Arterial Duplex Study Patient Name:  Erskine Squibb  Date of Exam:   05/26/2021 Medical Rec #: [id number]        Accession #:    [id number] Date of Birth: 07-28-1960        Patient Gender: F Patient Age:   56 years Exam Location:  [facility] Procedure:      VAS US CAROTID Referring Phys: Terrilee Files [facility] --------------------------------------------------------------------------------  Indications:  CVA. Risk Factors: None. Limitations   Today's exam was limited due to the patient's inability or               unwillingness to cooperate. Performing Technologist: Gertie Fey MHA, RDMS, RVT, RDCS  Examination Guidelines: A complete evaluation includes B-mode imaging, spectral Doppler, color Doppler, and power Doppler as needed of all accessible portions of each vessel. Bilateral testing is considered an integral part of a complete examination. Limited examinations for reoccurring indications may be performed as noted.  Right Carotid Findings: +----------+--------+--------+--------+---------------------+------------------+             PSV cm/s EDV cm/s Stenosis Plaque Description    Comments            +----------+--------+--------+--------+---------------------+------------------+  CCA Prox   82       21                                                          +----------+--------+--------+--------+---------------------+------------------+  CCA Distal 52       16                smooth and                                                                       heterogenous                               +----------+--------+--------+--------+---------------------+------------------+  ICA Prox   51       21                heterogenous, smooth                                                             and calcific                              +----------+--------+--------+--------+---------------------+------------------+  ICA Distal 61       26                                                          +----------+--------+--------+--------+---------------------+------------------+  ECA        67       15                                      intimal thickening  +----------+--------+--------+--------+---------------------+------------------+ +----------+--------+-------+----------------+-------------------+             PSV cm/s EDV cms Describe         Arm Pressure (mmHG)  +----------+--------+-------+----------------+-------------------+  Subclavian 113      91      Multiphasic, WNL                      +----------+--------+-------+----------------+-------------------+ +---------+--------+--+--------+--+---------+  Vertebral PSV cm/s 39 EDV cm/s 11 Antegrade  +---------+--------+--+--------+--+---------+  Left Carotid Findings: +----------+--------+--------+--------+--------------------------+--------+             PSV cm/s EDV cm/s Stenosis Plaque Description         Comments  +----------+--------+--------+--------+--------------------------+--------+  CCA Prox   66       24                                                     +----------+--------+--------+--------+--------------------------+--------+  CCA Distal 48       15                irregular and heterogenous           +----------+--------+--------+--------+--------------------------+--------+  ICA Prox   45       20                smooth and heterogenous              +----------+--------+--------+--------+--------------------------+--------+  ICA Distal 56       24                                                      +----------+--------+--------+--------+--------------------------+--------+  ECA        67       20                                                     +----------+--------+--------+--------+--------------------------+--------+ +----------+--------+--------+----------------+-------------------+             PSV cm/s EDV cm/s Describe         Arm Pressure (mmHG)  +----------+--------+--------+----------------+-------------------+  Subclavian 179               Multiphasic, WNL                      +----------+--------+--------+----------------+-------------------+ +---------+--------+--+--------+--+---------+  Vertebral PSV cm/s 37 EDV cm/s 14 Antegrade  +---------+--------+--+--------+--+---------+   Summary: Right Carotid: Velocities in the right ICA are consistent with a 1-39% stenosis. Left Carotid: Velocities in the left ICA are consistent with a 1-39% stenosis. Vertebrals:  Bilateral vertebral arteries demonstrate antegrade flow. Subclavians: Normal flow hemodynamics were seen in bilateral subclavian  arteries. *See table(s) above for measurements and observations.  Electronically signed by Delia Heady MD on 05/27/2021 at 12:37:56 PM.    Final    VAS [place] UPPER EXTREMITY VENOUS DUPLEX  Result Date: 06/11/2021 UPPER VENOUS STUDY  Patient Name:  Erskine Squibb  Date of Exam:   06/11/2021 Medical Rec #: [id number]        Accession #:    [id number] Date of Birth: 11/12/1960        Patient Gender: F Patient Age:   51 years Exam [facility]:  [facility] Procedure:      VAS [place] UPPER EXTREMITY VENOUS DUPLEX Referring Phys: Sula Soda --------------------------------------------------------------------------------  Indications: Swelling Limitations: Immobility - patient unable to move arm due to stroke. Comparison Study: No previous exams Performing Technologist: Jody Hill RVT, RDMS  Examination Guidelines: A complete evaluation includes B-mode imaging, spectral Doppler, color Doppler, and power  Doppler as needed of all accessible portions of each vessel. Bilateral testing is considered an integral part of a complete examination. Limited examinations for reoccurring indications may be performed as noted.  Right Findings: +----------+------------+---------+-----------+----------+-------+  RIGHT      Compressible Phasicity Spontaneous Properties Summary  +----------+------------+---------+-----------+----------+-------+  Subclavian     Full        Yes        Yes                         +----------+------------+---------+-----------+----------+-------+  Left Findings: +----------+------------+---------+-----------+----------+--------------+  LEFT       Compressible Phasicity Spontaneous Properties    Summary      +----------+------------+---------+-----------+----------+--------------+  IJV            Full        Yes        Yes                                +----------+------------+---------+-----------+----------+--------------+  Subclavian     Full        Yes        Yes                                +----------+------------+---------+-----------+----------+--------------+  Axillary       Full        Yes        Yes                                +----------+------------+---------+-----------+----------+--------------+  Brachial       Full        Yes        Yes                                +----------+------------+---------+-----------+----------+--------------+  Radial         Full                                                      +----------+------------+---------+-----------+----------+--------------+  Ulnar  Not visualized  +----------+------------+---------+-----------+----------+--------------+  Cephalic       Full                                                      +----------+------------+---------+-----------+----------+--------------+  Basilic        Full        Yes        Yes                                 +----------+------------+---------+-----------+----------+--------------+  *See table(s) above for measurements and observations.  Diagnosing physician: Gerarda Fraction Electronically signed by Gerarda Fraction on 06/11/2021 at 7:02:03 PM.    Final     Labs:  Basic Metabolic Panel: No results for input(s): NA, K, CL, CO2, GLUCOSE, BUN, CREATININE, CALCIUM, MG, PHOS in the last 168 hours.   CBC: No results for input(s): WBC, NEUTROABS, HGB, HCT, MCV, PLT in the last 168 hours.  CBG: No results for input(s): GLUCAP in the last 168 hours.  Family history.  Positive for hypertension as well as hyperlipidemia.  Denies any colon cancer esophageal cancer or rectal cancer  Brief HPI:   TESSLYN BAUMERT is a 61 y.o. right-handed female with history of hypertension hyperlipidemia COPD with tobacco abuse as well as polysubstance abuse.  Per chart review patient had been living alone in [place] for the last 2 years reportedly independent.  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  ICH was stable on multiple repeat CTs.  Her drug screen was positive for cocaine and amphetamines as well as long history of alcohol use.  She was started on hypertonic saline monitor closely in the ICU required nicardipine drip.  She was discharged on salt tablets 2 g every 8 hours with sodium greater than 140 as well as started on Keppra for seizure prophylaxis.  She was discharged planning to live with her son and daughter-in-law in [place] and on the drive from [place] noted increased left-sided weakness as well as altered mental status while driving through [place].  She did not seek attention for that and made it home and presented to [facility] 05/26/2021.  CT of the head as well as cervical spine showed large intraparenchymal hemorrhage involving posterior right frontal  lobe moderate amount of surrounding edema and associated mass-effect on the right lateral ventricle and proximally 11 mm right to left midline shift.  No acute traumatic cervical spine pathology.  MRA/MRI large intraparenchymal hemorrhage right frontal temporal region estimated volume 54 mL.  Medial extension to involve right basal ganglia thalamus right cerebral peduncle and midbrain.  No visible underlying lesion or structural abnormality.  Associated regional mass-effect 5 mm right to left shift as well as associated small volume subarachnoid hemorrhage within the adjacent posterior right parietal temporal region.  Echocardiogram with ejection fraction of 60 to 65% no wall motion abnormality.  Admission chemistries unremarkable except alkaline phosphatase 138 urine drug screen negative.  She remained on Keppra for seizure prophylaxis.  Therapy evaluations completed due to patient's left-sided weakness decreased functional mobility was admitted for a comprehensive rehab program.   Hospital Course: LANELL CARPENTER was admitted to rehab 05/29/2021  for inpatient therapies to consist of PT, ST and OT at least three hours five days a week. Past admission physiatrist, therapy team and rehab RN have worked together to provide customized collaborative inpatient rehab.  Pertaining to patient's right thalamic basal ganglia frontal temporal ICH in the setting of hypertension as well as cocaine use.  Patient participating with therapies follow-up outpatient neurology services.  Amatory referral was obtained with ophthalmology services for bouts of blurred vision.  Thigh-high support stockings in place for DVT prophylaxis.  Noted bouts of headaches maintained on Topamax follow-up CT scan stable midline shift had improved.  Blood pressure controlled she had been on lisinopril but blood pressures were a bit soft she would need outpatient follow-up.  Keppra for seizure prophylaxis no seizure activity and tapered to off.   Latest EEG 06/14/2021 showed no seizure activity.  Hyperlipidemia statin on hold due to ICH.  History of COPD tobacco abuse oxygen saturations maintained discussed no nicotine products.  She was maintained on a NicoDerm patch.  Bouts of constipation resolved with laxative assistance.   Blood pressures were monitored on TID basis and soft and monitored     Rehab course: During patient's stay in rehab weekly team conferences were held to monitor patient's progress, set goals and discuss barriers to discharge. At admission, patient required moderate assist step pivot transfers moderate assist sit to stand  Physical exam.  Blood pressure 136/80 pulse 75 temperature 98 respirations 28 oxygen saturation 95% room air Constitutional.  No acute distress appearing older than stated age HEENT.  Multiple missing teeth Head.  Normocephalic and atraumatic Eyes.  Pupils round and reactive to light no discharge.nystagmus Neck.  Supple nontender no JVD without thyromegaly Cardiac regular rate rhythm and extra sounds or murmur heard Abdomen.  Soft nontender positive bowel sounds without rebound Respiratory effort normal no respiratory distress without wheeze Musculoskeletal.  Normal range of motion Skin.  Warm and dry Neurologic.  Patient alert makes eye contact with examiner.  Left central 7.  Speech slightly dysarthric.  She was easily distracted with decreased concentration.  Poor insight and awareness.  Moves right side freely 4/5.  Left upper extremity 1-1+/5 proximal to distal.  Left lower extremity 1+ to 2 - hip extension knee extension to 0/5 distally.  DTR's 3+.  He/She  has had improvement in activity tolerance, balance, postural control as well as ability to compensate for deficits. He/She has had improvement in functional use RUE/LUE  and RLE/LLE as well as improvement in awareness.  Sessions focused on family teaching.  Sit to stand rolling walker from recliner with contact-guard.  Ambulates 100 feet  contact-guard rolling walker.  Completed 8 steps with contact-guard.  Minimal assist for toileting needing assist for clothing management on left.  Completed amatory shower transfer to walk-in shower with contact-guard.  Min assist to don shirt with increased time to complete task.  Patient scored within functional limits on inferencing and metaphorical language subsections on testing with right hemisphere burns assessment.  It was discussed the need for supervision assist on discharge.  Full family teaching completed and discharged to home       Disposition: Discharged home    Diet: Regular  Special Instructions: No driving smoking alcohol or illicit drug use  Medications at discharge 1.  Tylenol as needed 2.  Baclofen 10 mg 3 times daily as needed muscle spasms 3.  Fioricet 1 tab every 6 hours as needed headache 4.  Flonase 2 sprays each nostril daily 5.  Folic  acid 1 mg p.o. daily 6.  Melatonin 3 mg nightly as needed sleep 7.  Multivitamin daily 8.  NicoDerm patch taper as directed 9.  MiraLAX daily 10.  Topamax 100 mg p.o. twice daily 11.  Tramadol 50 mg every 8 hours as needed pain 12.  Vitamin B12 1000 mcg p.o. daily 13.  Vitamin D 50,000 units every 7 days  30-35 minutes were spent completing discharge summary and discharge planning  Discharge Instructions     Ambulatory referral to Neurology   Complete by: As directed    An appointment is requested in approximately: 4 weeks right basal ganglia ICH   Ambulatory referral to Physical Medicine Rehab   Complete by: As directed    Moderate complexity follow-up 1 to 2 weeks nontraumatic ICH   Polysomnography 4 or more parameters   Complete by: Jun 28, 2021    Ambulatory referral for sleep study.  Patient with nontraumatic ICH   Where should this test be performed: [facility]        Follow-up Information     Raulkar, Drema Pry, MD Follow up.   Specialty: Physical Medicine and Rehabilitation Why:  06/28/21: please arrive at 10:10am for 10:40am appointment, thank you! Contact information: [address] [place] [address] [phone]         Claiborne Rigg, NP Follow up on 07/14/2021.   Specialty: Nurse Practitioner Why: Appointment 2:30 PM Contact information: [address] [place] [address] [phone]         Aura Camps, MD Follow up.   Specialty: Ophthalmology Why: call for appointment for vision changes after ICH Contact information: [address] [place] [address] [phone]                 Signed: Mcarthur Rossetti Carolee Channell 06/18/2021, 5:29 AM

## 2021-06-15 NOTE — Progress Notes (Signed)
Occupational Therapy Session Note  Patient Details  Name: Monica Miranda MRN: [id number] Date of Birth: [birth year]  Today's Date: 06/15/2021 OT Individual Time: 1005-1105 OT Individual Time Calculation (min): 60 min    Short Term Goals: Week 1:  OT Short Term Goal 1 (Week 1): Pt will don shirt using hemi techniques with min A OT Short Term Goal 1 - Progress (Week 1): Met OT Short Term Goal 2 (Week 1): Pt will complete 3/3 toileting tasks with min A OT Short Term Goal 2 - Progress (Week 1): Met OT Short Term Goal 3 (Week 1): Pt will complete toilet transfer with min A OT Short Term Goal 3 - Progress (Week 1): Met OT Short Term Goal 4 (Week 1): Pt will don pants with min A OT Short Term Goal 4 - Progress (Week 1): Met Week 2:  OT Short Term Goal 1 (Week 2): pt will incorporate LUE into ADLs during 3 consecutive OT sessions with one verbal cue OT Short Term Goal 2 (Week 2): pt will complete one standing grooming task at sink with CGA OT Short Term Goal 3 (Week 2): pt will completing bathing from shower seat with CGA OT Short Term Goal 4 (Week 2): pt will maintain sustained attention during ADLs to facilitate improved anticipatory awareness for safety during bathing and dressing Week 3:     Skilled Therapeutic Interventions/Progress Updates:  Patient seen this A,M. For BADL relating task in showering  the pt required CGA for coming from supine in bed to EOB. She was able to t/f to the w/c with CGA and MinA for locking the brakes.  The pt was s/u for clothing management prior to bathing, she washed her hair with s/u and  was able to bath  UB/LB with MinA for washing her back, she required CGA for stabilizing when coming from sit to stand using a RW for balance.  The pt  was maxA for drying her hair.  She returned from w/c LOF to bed with CGA for a rest break  between treatment sessions. The alarm was activated with the call light in place and all additional needs addressed.  The pt had no c/o  pain this treatment session.       Therapy Documentation Precautions:  Precautions Precautions: Fall Precaution Comments: BP <150, L hemi, L inattention Restrictions Weight Bearing Restrictions: No General:   Vital Signs:  Pain:   A Vision   Perception    Praxis   Balance   Exercises:   Other Treatments:     Therapy/Group: Individual Therapy  Yvonne Kendall 06/15/2021, 1:09 PM

## 2021-06-15 NOTE — Progress Notes (Signed)
Speech Language Pathology Daily Session Note  Patient Details  Name: Monica Miranda MRN: [id number] Date of Birth: [birth year]  Today's Date: 06/15/2021 SLP Individual Time: 1431-1530 SLP Individual Time Calculation (min): 59 min  Short Term Goals: Week 2: SLP Short Term Goal 1 (Week 2): STG=LTG's (ELOS: planned discharge 1/12)  Skilled Therapeutic Interventions:Skilled ST services focused on cognitive skills. SLP attempted to facilitate mildly-complex problem solving and recall skills in medication management task with pill organizer, however task was not completed due to pt supporting taking alternative medication verse prescribed medication. Pt was able to demonstrate verbal problem solving for medication consumed several times a day given various scenarios with supervision A verbal cues. Pt was able to recall 4/4 word from yesterday's sessions mod I, indicting carryover of recall skills. Pt continues to demonstrated tangential and verbose speech, however was more easily redirected today when SLP would interrupt one way conversation. SLP provided education pertaining to noticed deficits in prosody in yesterday's session, however pt stated " I am not concerned with that. I just want to be able to run." Pt was left in room with call bell within reach and chair alarm set. SLP recommends to continue skilled services.     Pain Pain Assessment Pain Score: 0-No pain  Therapy/Group: Individual Therapy  Heath Badon  [facility] 06/15/2021, 3:52 PM

## 2021-06-16 ENCOUNTER — Other Ambulatory Visit (HOSPITAL_COMMUNITY): Payer: Self-pay

## 2021-06-16 MED ORDER — ALBUTEROL SULFATE HFA 108 (90 BASE) MCG/ACT IN AERS
1.0000 | INHALATION_SPRAY | Freq: Four times a day (QID) | RESPIRATORY_TRACT | 0 refills | Status: DC | PRN
  Filled 2021-06-16: qty 18, 25d supply, fill #0

## 2021-06-16 MED ORDER — TRAMADOL HCL 50 MG PO TABS
50.0000 mg | ORAL_TABLET | Freq: Three times a day (TID) | ORAL | 0 refills | Status: DC | PRN
  Filled 2021-06-16: qty 21, 7d supply, fill #0

## 2021-06-16 MED ORDER — NICOTINE 21 MG/24HR TD PT24
MEDICATED_PATCH | TRANSDERMAL | 0 refills | Status: DC
  Filled 2021-06-16: qty 28, fill #0

## 2021-06-16 MED ORDER — TOPIRAMATE 100 MG PO TABS
100.0000 mg | ORAL_TABLET | Freq: Two times a day (BID) | ORAL | 0 refills | Status: DC
  Filled 2021-06-16: qty 60, 30d supply, fill #0

## 2021-06-16 MED ORDER — BACLOFEN 10 MG PO TABS
10.0000 mg | ORAL_TABLET | Freq: Three times a day (TID) | ORAL | 0 refills | Status: DC | PRN
  Filled 2021-06-16: qty 30, 10d supply, fill #0

## 2021-06-16 MED ORDER — ACETAMINOPHEN 325 MG PO TABS: 650.0000 mg | ORAL_TABLET | ORAL | Status: DC | PRN

## 2021-06-16 MED ORDER — ADULT MULTIVITAMIN W/MINERALS CH: 1.0000 | ORAL_TABLET | Freq: Every day | ORAL | Status: DC

## 2021-06-16 MED ORDER — MELATONIN 3 MG PO TABS
3.0000 mg | ORAL_TABLET | Freq: Every evening | ORAL | 0 refills | Status: DC | PRN
  Filled 2021-06-16: qty 30, 30d supply, fill #0

## 2021-06-16 MED ORDER — VITAMIN D (ERGOCALCIFEROL) 1.25 MG (50000 UNIT) PO CAPS
50000.0000 [IU] | ORAL_CAPSULE | ORAL | 0 refills | Status: DC
  Filled 2021-06-16: qty 5, 34d supply, fill #0

## 2021-06-16 MED ORDER — BUTALBITAL-APAP-CAFFEINE 50-325-40 MG PO TABS
1.0000 | ORAL_TABLET | Freq: Four times a day (QID) | ORAL | 0 refills | Status: DC | PRN
  Filled 2021-06-16: qty 10, 3d supply, fill #0

## 2021-06-16 MED ORDER — PANTOPRAZOLE SODIUM 40 MG PO TBEC
40.0000 mg | DELAYED_RELEASE_TABLET | Freq: Every day | ORAL | 0 refills | Status: DC
  Filled 2021-06-16: qty 30, 30d supply, fill #0

## 2021-06-16 MED ORDER — POLYETHYLENE GLYCOL 3350 17 G PO PACK: 17.0000 g | PACK | Freq: Every day | ORAL | 0 refills | Status: DC

## 2021-06-16 MED ORDER — FOLIC ACID 1 MG PO TABS
1.0000 mg | ORAL_TABLET | Freq: Every day | ORAL | 0 refills | Status: DC
  Filled 2021-06-16: qty 30, 30d supply, fill #0

## 2021-06-16 MED ORDER — CYANOCOBALAMIN 1000 MCG PO TABS
1000.0000 ug | ORAL_TABLET | Freq: Every day | ORAL | 0 refills | Status: DC
  Filled 2021-06-16: qty 30, 30d supply, fill #0

## 2021-06-16 NOTE — Progress Notes (Signed)
Speech Language Pathology Discharge Summary  Patient Details  Name: Monica Miranda MRN: [id number] Date of Birth: Feb 12, 1961  Today's Date: 06/16/2021 SLP Individual Time: 0817-0859 SLP Individual Time Calculation (min): 42 min   Skilled Therapeutic Interventions:  Skilled ST services focused on education and cognitive skills. SLP facilitated complex problem solving and recall skills utilizing reasoning passage from [place]. Pt was unable to read passage despite SLP making modifications to enlarge paragraph. Pt listened to passage read aloud by SLP, pt required passage to be read aloud x4 and after cues to utilize written aid for recall, pt required supervision A verbal cues to solve problems. SLP provided education pertaining to need for assistance in complex problem solving, complex recall of novel information and assistance in higher level pragmatic skills. All questions answered to satisfaction. Pt was left in room with call bell within reach and bed alarm set.   Patient has met 4 of 4 long term goals.  Patient to discharge at overall Supervision;Modified Independent level.  Reasons goals not met:     Clinical Impression/Discharge Summary:   Pt made great progress meeting 4 out 4 goals, discharging at supervision A to mod I. Pt demonstrated improvements in orientation, complex problem solving and novel recall. Pt initially demonstrated mild deficits in these areas, however given same subsections for Cognistat as on evaluation date pt scored WFL. Pt does continue to demonstrate deficits in prosody and pragmatics as noted on [place] Right Hemisphere assessment, but pt question's personality verse acute deficits. Education has been completed with pt and pt's daughter-in-law. Pt's barriers to discharge remain higher level skills such as complex problem solving, complex novel recall and higher level pragmatics. Pt benefited from skilled ST services in order to maximize functional independence and reduce  burden of care, requiring intermittent supervision for complex tasks at discharge with continued skilled OPST services if desired.  Care Partner:  Caregiver Able to Provide Assistance: Yes  Type of Caregiver Assistance: Cognitive  Recommendation:  24 hour supervision/assistance;Outpatient SLP  Rationale for SLP Follow Up: Maximize cognitive function and independence;Reduce caregiver burden   Equipment: N/A   Reasons for discharge: Discharged from hospital   Patient/Family Agrees with Progress Made and Goals Achieved: Yes    Augustine Leverette  [facility] 06/16/2021, 10:24 AM

## 2021-06-16 NOTE — Progress Notes (Signed)
Physical Therapy Session Note  Patient Details  Name: Monica Miranda MRN: [id number] Date of Birth: 04-20-1961  Today's Date: 06/16/2021 PT Individual Time: 1426-1530 PT Individual Time Calculation (min): 64 min   Short Term Goals: Week 2:  PT Short Term Goal 1 (Week 2): STG = LTG due to ELOS Week 3:     Skilled Therapeutic Interventions/Progress Updates:    Patient received sitting up in recliner, agreeable to PT. She denies pain. Patient perseverative on using her "own 2 legs" to walk and not using a wc. PT discussing use of wc for safety for community mobility/longer distances and with fatigue. She transferred to wc via stand pivot with CGA. PT transporting patient in wc to therapy gym for time management and energy conservation. PT setting up home wc for patient including lowering the wc to allow for hemi-technique propulsion. Discussed home safety and activity modifications with patient. Patient ambulating ~149ft with RW and CGA. Noted R LE adduction, but just to midline, increased gait speed resulting in generally increased instability. Patient remains with poor safety awareness and poor insight into deficits. Asking PT multiple times if she could run while in the hospital. PT reiterating that it is not safe to run at this time. Patient ambulated ~68ft with no AD and CGA. Still with increased gait speed and general instability. Patient remaining up in wc, seatbelt alarm on, call light within reach.   Therapy Documentation Precautions:  Precautions Precautions: Fall Precaution Comments: BP <150, L hemi, L inattention Restrictions Weight Bearing Restrictions: No     Therapy/Group: Individual Therapy  Elizebeth Koller, PT, DPT, CBIS  06/16/2021, 8:00 AM

## 2021-06-16 NOTE — Progress Notes (Signed)
Patient ID: Monica Miranda, female   DOB: 13-Nov-1960, 62 y.o.   MRN: [id number]  Met pt and Mandy-daughter in-law present in her room, she gave worker permission to speak in front of her. Gave them team conference update and progress this week. Pt feels ready to go home tomorrow. Equipment in room. Home health arranged for PT & OT via [facility]. Match in for prescription assistance. See in am for any last minute questions

## 2021-06-16 NOTE — Progress Notes (Signed)
Occupational Therapy Session Note  Patient Details  Name: Monica Miranda MRN: [id number] Date of Birth: 04-24-1961  Today's Date: 06/16/2021 OT Individual Time: 1300-1415 OT Individual Time Calculation (min): 75 min    Short Term Goals: Week 2:  OT Short Term Goal 1 (Week 2): pt will incorporate LUE into ADLs during 3 consecutive OT sessions with one verbal cue OT Short Term Goal 2 (Week 2): pt will complete one standing grooming task at sink with CGA OT Short Term Goal 3 (Week 2): pt will completing bathing from shower seat with CGA OT Short Term Goal 4 (Week 2): pt will maintain sustained attention during ADLs to facilitate improved anticipatory awareness for safety during bathing and dressing  Skilled Therapeutic Interventions/Progress Updates:  Pt greeted  seated in recliner finishing lunch, pt  agreeable to OT intervention. Session focus on functional mobility, LUE FMC. Pt completed stand pivot transfer from recliner>w/c with no AD and CGA. Pt transported to gym with total A. Worked on DC planning and assessment of FMC: Pt unable to complete 9 hole peg test on LUE   BUE strength: shoulder flexion: LUE: 3+/5 RUE: 4/5 elbow flexion/extension LUE: 3+/5 RUE: 4/5  Sensation: impaired sensation on LUE reports of tingling and numbness Cognition: 100% on BIMS AxOx4 Vision:continues to present with L inattention, photophobia, blurry vision on L side and impaired depth perception. Issued LUE FMC HEP with theraputty  to facilitate improvements with functional grip strength for ADL participation.  Pt transported back to room with total A where pt completed functional mobility from door way to recliner with HHA with CGA.  pt left seated in recliner with alarm belt activated and all needs within reach.                     Therapy Documentation Precautions:  Precautions Precautions: Fall Precaution Comments: L inattention, L hemi Restrictions Weight Bearing Restrictions: No  Pain: no pain  reported during session     Therapy/Group: Individual Therapy  Barron Schmid 06/16/2021, 3:43 PM

## 2021-06-16 NOTE — Progress Notes (Signed)
Occupational Therapy Session Note  Patient Details  Name: Monica Miranda MRN: [id number] Date of Birth: Jul 22, 1960  Today's Date: 06/16/2021 OT Individual Time: 0940-7680 OT Individual Time Calculation (min): 40 min    Short Term Goals: Week 1:  OT Short Term Goal 1 (Week 1): Pt will don shirt using hemi techniques with min A OT Short Term Goal 1 - Progress (Week 1): Met OT Short Term Goal 2 (Week 1): Pt will complete 3/3 toileting tasks with min A OT Short Term Goal 2 - Progress (Week 1): Met OT Short Term Goal 3 (Week 1): Pt will complete toilet transfer with min A OT Short Term Goal 3 - Progress (Week 1): Met OT Short Term Goal 4 (Week 1): Pt will don pants with min A OT Short Term Goal 4 - Progress (Week 1): Met Week 2:  OT Short Term Goal 1 (Week 2): pt will incorporate LUE into ADLs during 3 consecutive OT sessions with one verbal cue OT Short Term Goal 2 (Week 2): pt will complete one standing grooming task at sink with CGA OT Short Term Goal 3 (Week 2): pt will completing bathing from shower seat with CGA OT Short Term Goal 4 (Week 2): pt will maintain sustained attention during ADLs to facilitate improved anticipatory awareness for safety during bathing and dressing   Skilled Therapeutic Interventions/Progress Updates:    Pt greeted at time of unplanned OT session finishing up exit survey, re-entered room to continue session when finished. Pt agreeable to additional OT session. No pain reported and no migraine. Pt hyperverbal throughout session and needing Max cues to attend to tasks and attempted to decrease distractions whenever possible. Pt ambulating no AD chair <> bathroom and toilet transfer all CGA with aircast already on LLE. Min A for toileting tasks despite cues to try to use L hand to perform clothing management on L side. Back in chair, focused on bimanual tasks, attending to the L side, and FMC/GMC activities with B hands but incorporating the L hand whenever able. Pt  left up in chair alarm on call bell in reach and all needs met.   Therapy Documentation Precautions:  Precautions Precautions: Fall Precaution Comments: L inattention, L hemi Restrictions Weight Bearing Restrictions: No      Therapy/Group: Individual Therapy  Viona Gilmore 06/16/2021, 4:22 PM

## 2021-06-16 NOTE — Progress Notes (Signed)
Physical Therapy Session Note  Patient Details  Name: Monica Miranda MRN: [id number] Date of Birth: [birth year]  Today's Date: 06/16/2021 PT Individual Time: 1135-1205 PT Individual Time Calculation (min): 30 min   Short Term Goals: Week 3:     Skilled Therapeutic Interventions/Progress Updates:    Pt sitting EOB at start of session and excited for start of therapy. L inattention noted as pt did not look to see who entered room after knocking. Pt educated on donning aircast and completed totalA for time. Pt CGA sit<>stand and ambulated 5 ft w/ RW L hand splint to w/c. Pt educated and voiced understanding of when to use w/c despite initial frustration. Pt completed R hemi w/c mobility ~15-63f w/ minA and max VC due to motor planning and problem solving. Pt unable to find L hand break on w/c w/o assist due to L inattention. Pt ambulated CGA ~150+~150 x2 w/ standing rest break after 180 L turn. Pt turned slowly w/ short choppy steps and required frequent VC to look up while ambulating. Pt ambulated to recliner and demonstrated lack of safety awareness w/ walker during stand<>sit transfer and required VC to unstrap hand from RW after sitting. Pt daughter in law educated on w/c parts and management. Pt left in recliner w/ family present, posey belt on, alarm on and needs met.  Therapy Documentation Precautions:  Precautions Precautions: Fall Precaution Comments: BP <150, L hemi, L inattention Restrictions Weight Bearing Restrictions: No General:      Therapy/Group: Individual Therapy  CLucile Shutters SPT  06/16/2021, 12:14 PM

## 2021-06-16 NOTE — Progress Notes (Signed)
Inpatient Rehabilitation Care Coordinator Discharge Note   Patient Details  Name: Monica Miranda MRN: [id number] Date of Birth: 1960-07-03   Discharge location: HOME WITH SON AND DAUGHTER [place]MANDY WILL HAVE 24/7 CARE  Length of Stay: 19 DAYS  Discharge activity level: CGA-MIN LEVEL  Home/community participation: ACTIVE  Patient response UY:QIHKVQ Literacy - How often do you need to have someone help you when you read instructions, pamphlets, or other written material from your doctor or pharmacy?: Sometimes  Patient response QV:ZDGLOV Isolation - How often do you feel lonely or isolated from those around you?: Never  Services provided included: MD, RD, PT, OT, SLP, RN, CM, TR, Pharmacy, Neuropsych, SW  Financial Services:  Field seismologist Utilized: Other (Comment) (MEDICAID PENDING)    Choices offered to/list presented to: PT AND DAUGHTER  Follow-up services arranged:  Home Health, DME, Patient/Family has no preference for HH/DME agencies Home Health Agency: [facility]  PT & OT Anahlia HAVE NO SPEECH THERAPIST AVAILABLE    DME : [facility]  WHEELCHAIR, ROLLING WALKER AND TUB BENCH MATCH FOR PRESCRIPTION ASSISTANCE. HANDICAPPED APPLICATION GIVEN TO PT/FAMILY    Patient response to transportation need: Is the patient able to respond to transportation needs?: Yes In the past 12 months, has lack of transportation kept you from medical appointments or from getting medications?: No In the past 12 months, has lack of transportation kept you from meetings, work, or from getting things needed for daily living?: No    Comments (or additional information):MANDY-DAUGHTER [place] AS IN FOR EDUCATION AND ARE AWARE OF PT'S CARE. BOTH COMFORTABLE WITH DISCHARGE AND PT'S CARE. SSD AND MEDICAID PENDING. PCP SET UP VIA [facility] Shon Hale FLEMING 2/8 2:30 PM.   Patient/Family verbalized understanding of follow-up arrangements:  Yes  Individual  responsible for coordination of the follow-up plan: [name]-DAUGHTER [phone]  Confirmed correct DME delivered: Lucy Chris 06/16/2021    Blondie Riggsbee, Lemar Livings

## 2021-06-16 NOTE — Progress Notes (Signed)
Inpatient Rehabilitation Discharge Medication Review by a Pharmacist  A complete drug regimen review was completed for this patient to identify any potential clinically significant medication issues.  High Risk Drug Classes Is patient taking? Indication by Medication  Antipsychotic No   Anticoagulant No   Antibiotic No   Opioid Yes Fioricet for HA's, Tramadol for other pain  Antiplatelet No   Hypoglycemics/insulin No   Vasoactive Medication No   Chemotherapy No   Other No      Type of Medication Issue Identified Description of Issue Recommendation(s)  Drug Interaction(s) (clinically significant)     Duplicate Therapy     Allergy     No Medication Administration End Date     Incorrect Dose     Additional Drug Therapy Needed     Significant med changes from prior encounter (inform family/care partners about these prior to discharge).  D/c Keppra and lisinopril  Other       Clinically significant medication issues were identified that warrant physician communication and completion of prescribed/recommended actions by midnight of the next day:  No  Time spent performing this drug regimen review (minutes):  10 min  Kit Mollett S. Merilynn Finland, PharmD, BCPS Clinical Staff Pharmacist [email or web address] Pasty Spillers 06/16/2021 10:45 AM

## 2021-06-16 NOTE — Progress Notes (Signed)
PROGRESS NOTE   Subjective/Complaints: Migraines improved She asks if she is stable for discharge tomorrow and she is very happy to hear that she is! Discussed outpatient follow-up  ROS:   Pt denies SOB, abd pain, CP, N/V/C/D, and vision changes HA and photophobia are the same/slightly better, +jerking activity of left side, +stuttering  Objective:   EEG adult  Result Date: 06/14/2021 Charlsie Quest, MD     06/14/2021  6:06 PM Patient Name: Monica Miranda MRN: [id number] Epilepsy Attending: Charlsie Quest Referring Physician/Provider: Horton Chin, MD Date: 06/14/2021 Duration: 22.24 mins Patient history: 61yo F with right thalamic/basal ganglia/frontal temporal ICH in the setting of hypertension as well as cocaine use. EEG to evaluate for seizure Level of alertness: Awake AEDs during EEG study: TPM Technical aspects: This EEG study was done with scalp electrodes positioned according to the 10-20 International system of electrode placement. Electrical activity was acquired at a sampling rate of 500Hz  and reviewed with a high frequency filter of 70Hz  and a low frequency filter of 1Hz . EEG data were recorded continuously and digitally stored. Description: The posterior dominant rhythm consists of 10 Hz activity of moderate voltage (25-35 uV) seen predominantly in posterior head regions, asymmetric ( right<left) and reactive to eye opening and eye closing. EEG showed continuous 3 to 6 Hz theta-delta slowing in right hemisphere. Hyperventilation and photic stimulation were not performed.   ABNORMALITY - Continuous slow, right hemisphere - Background asymmetry, right<left IMPRESSION: This study is suggestive of cortical dysfunction arising from right hemisphere likely secondary to underlying structural abnormality/ICH. No seizures or epileptiform discharges were seen throughout the recording. Priyanka O Yadav   No results for input(s): WBC,  HGB, HCT, PLT in the last 72 hours.   No results for input(s): NA, K, CL, CO2, GLUCOSE, BUN, CREATININE, CALCIUM in the last 72 hours.    Intake/Output Summary (Last 24 hours) at 06/16/2021 1132 Last data filed at 06/15/2021 1338 Gross per 24 hour  Intake 480 ml  Output --  Net 480 ml      Pressure Injury 05/25/21 Sacrum Medial;Lower Stage 1 -  Intact skin with non-blanchable redness of a localized area usually over a bony prominence. (Active)  05/25/21 2115  Location: Sacrum  Location Orientation: Medial;Lower  Staging: Stage 1 -  Intact skin with non-blanchable redness of a localized area usually over a bony prominence.  Wound Description (Comments):   Present on Admission: Yes    Physical Exam: Vital Signs Blood pressure 124/84, pulse 75, temperature 98.7 F (37.1 C), temperature source Oral, resp. rate 14, height 5\' 1"  (1.549 m), weight 44.2 kg, SpO2 99 %.     General: awake, alert, appropriate, laying supine in bed in dark;  NAD HENT: conjugate gaze; oropharynx moist CV: bradycardia no JVD Pulmonary: CTA B/L; no W/R/R- good air movement GI: soft, NT, ND, (+)BS Psychiatric: appropriate- anxious about needing to void Neurological: Alert- speech much more clear; still stih some stuttering;  hoffman's and 2-3 beats clonus LLE- light off due to photophobia Ext: left hand swelling.  Psych: Pt's affect is appropriate. Pt is cooperative, Hyperverbose Skin: Clean and intact without signs of breakdown Neuro:  pt  is alert and oriented to person, place. Poor insight into deficits. Left central 7, right gaze preference. Mild dysarthria. Poor concentration. LUE 1 to 1+/5. LLE 1+ to 2/5 HE, KE and 0/5 distally. Decreased LT left arm and leg. DTR's 3+ LLE and LUE with early extensor and flexor tone respectively Musculoskeletal: Full ROM, No pain with AROM or PROM in the neck, trunk, or extremities. Posture appropriate. Improved ambulation with RW GU: some incontinent episodes     Assessment/Plan: 1. Functional deficits which require 3+ hours per day of interdisciplinary therapy in a comprehensive inpatient rehab setting. Physiatrist is providing close team supervision and 24 hour management of active medical problems listed below. Physiatrist and rehab team continue to assess barriers to discharge/monitor patient progress toward functional and medical goals  Care Tool:  Bathing  Bathing activity did not occur: Safety/medical concerns (no time/declined) Body parts bathed by patient: Right arm, Left arm, Chest, Abdomen, Front perineal area, Buttocks, Right upper leg, Left upper leg, Right lower leg, Left lower leg, Face         Bathing assist Assist Level: Contact Guard/Touching assist     Upper Body Dressing/Undressing Upper body dressing   What is the patient wearing?: Pull over shirt    Upper body assist Assist Level: Minimal Assistance - Patient > 75%    Lower Body Dressing/Undressing Lower body dressing      What is the patient wearing?: Pants, Underwear/pull up     Lower body assist Assist for lower body dressing: Minimal Assistance - Patient > 75%     Toileting Toileting Toileting Activity did not occur (Clothing management and hygiene only): N/A (no void or bm)  Toileting assist Assist for toileting: Minimal Assistance - Patient > 75%     Transfers Chair/bed transfer  Transfers assist     Chair/bed transfer assist level: Contact Guard/Touching assist     Locomotion Ambulation   Ambulation assist      Assist level: Contact Guard/Touching assist Assistive device: Walker-rolling Max distance: 20'   Walk 10 feet activity   Assist     Assist level: Contact Guard/Touching assist Assistive device: Walker-rolling   Walk 50 feet activity   Assist Walk 50 feet with 2 turns activity did not occur: Safety/medical concerns  Assist level: Minimal Assistance - Patient > 75% Assistive device: Walker-rolling    Walk 150  feet activity   Assist Walk 150 feet activity did not occur: Safety/medical concerns  Assist level: Minimal Assistance - Patient > 75% Assistive device: Walker-rolling    Walk 10 feet on uneven surface  activity   Assist Walk 10 feet on uneven surfaces activity did not occur: Safety/medical concerns         Wheelchair     Assist Is the patient using a wheelchair?: Yes Type of Wheelchair: Manual    Wheelchair assist level: Dependent - Patient 0% Max wheelchair distance: 150    Wheelchair 50 feet with 2 turns activity    Assist        Assist Level: Dependent - Patient 0%   Wheelchair 150 feet activity     Assist      Assist Level: Dependent - Patient 0%   Blood pressure 124/84, pulse 75, temperature 98.7 F (37.1 C), temperature source Oral, resp. rate 14, height 5\' 1"  (1.549 m), weight 44.2 kg, SpO2 99 %.  Medical Problem List and Plan: 1.  Left-sided weakness functional deficits secondary to right thalamic/basal ganglia/frontal temporal ICH in the setting of hypertension as well  as cocaine use             -patient may shower             -ELOS/Goals: 18-24 days, min assist with PT, OT, sup/min SLP             -left WFO, PRAFO's ordered  Continue CIR  -discussed timeline of healing from ICH  TC scheduled with me 1/23  Encouraged on her participation despite headache  Discussed benefits of water therapy outaptient in conjunction with her neuro-rehab  Went over (-) CT for new stroke.  2.  Impaired mobility: continue Antiembolism stockings, thigh (TED hose) Bilateral lower extremities             -antiplatelet therapy: N/A 3.Headache: Increase topamax to 100mg  BID. CT head stable- discussed that midline shift has improved to 2mm. Discontinue magnesium. Edited nursing order to include administration of Bacopa as well.  4. Anxiety:  Provide emotional support, Therapeutic rec consulted to provide information on bringing in her pet dogs for support.               -antipsychotic agents: N/A 5. Neuropsych: This patient is capable of making decisions on her own behalf. 6. Skin/Wound Care: Routine skin checks 7. Fluids/Electrolytes/Nutrition: encourage fluids  -check labs 12/26 8.  Hypertension.  d/c lisinopril.  Monitor with increased mobility  1/8- BP controlled- con't regimen 9.  Seizure prophylaxis. Discontinue Keppra 10.  Hyperlipidemia.  Statin held given ICH. 11.  History of polysubstance abuse.  Provide counseling 12.  History of COPD/tobacco use.  Monitor oxygen saturations every shift. 13. Constipation:             -good results with sorbitol overnight 14. Decreased appetite: edited diet order with her preferences. 15. Low protein levels: discussed good protein sources  16. Transaminitis: Patient may use on med NAC from home- placed order.  17. Constipation: increase magnesium gluconate to 250mg  BID. Resolved. Check magnesium level tomorrow.  18. Right rib pain: rib XR ordered. Discussed that results are normal.  19. Left lower extremity weakness: aircast ordered 20. Cluster headaches: 100% O2 via nonrebreather ordered PRN, discussed with patient and nursing, asked nursing to scheduled fitting of nonrebreather this morning.  21. Nausea: d/c magnesium. Zofran ordered.  22. B12 deficiency: Discussed B12 level is elevated and homocysteine level is normal.  23. Neuropathy: B12 and folate levels checked and within normal limits. F/u B6 24. Bradycardia: medications reviewed, continue to monitor TID.  25. Hypotension: medications reviewed, decrease tramadol to q8H prn.  26. Left upper extremity swelling: discussed with daughter bringing in compression garments for her 27. Spasticity- think it's the cause of her "convulsions" sounds like spasms more than localized seizure activity- d/w pt- will try Baclofen PRN, not scheduled. 1/8- no more spasms episodes- con't prn meds  28. Nasal congestion-   1/7- flonase- will order 29. Active smoker:  Pt wants nicotine patch when goes home, will prescribe 30. Vitamin D deficiency: start ergocalciferol 50,000U once per week for 7 weeks.  31. Jerking activity of left side: Neurology note reviewed: EEG negative for seizure, more consistent with ICH. Discussed with patient and her daughter     LOS: 18 days A FACE TO FACE EVALUATION WAS PERFORMED  Clint BolderKrutika P Jamaurie Bernier 06/16/2021, 11:32 AM

## 2021-06-16 NOTE — Discharge Summary (Signed)
Physical Therapy Discharge Summary  Patient Details  Name: Monica Miranda MRN: [id number] Date of Birth: 1961/03/11  Patient has met 9 of 11 long term goals due to improved activity tolerance, improved balance, improved postural control, increased strength, ability to compensate for deficits, functional use of  left upper extremity and left lower extremity, improved attention, and improved awareness.  Patient to discharge at an ambulatory level [place].   Patient's care partner is independent to provide the necessary physical assistance at discharge. Family education completed with daughter-in-law prior to DC. Family educated on fall prevention, home safety, safe guarding for ambulation and stairs, deficits related to her CVA, and general stroke recovery.   Reasons goals not met: W/c mobility goals set to supervision. Pt required minA for maintaining straight path and mod to max instructional cues for hemi technique to propel.   Recommendation:  Patient will benefit from ongoing skilled PT services in outpatient setting to continue to advance safe functional mobility, address ongoing impairments in LLE NMR, dynamic standing balance, gait training, safety awareness and fall prevention, and minimize fall risk.  Equipment: RW, 16x16 w/c  Reasons for discharge: treatment goals met and discharge from hospital  Patient/family agrees with progress made and goals achieved: Yes  PT Discharge Precautions/Restrictions Precautions Precautions: Fall Precaution Comments: L inattention, L hemi Restrictions Weight Bearing Restrictions: No Pain Pain Assessment Pain Score: 0-No pain Pain Interference Pain Interference Pain Effect on Sleep: 2. Occasionally Pain Interference with Therapy Activities: 2. Occasionally Pain Interference with Day-to-Day Activities: 2. Occasionally Vision/Perception  Vision - History Ability to See in Adequate Light: 1 Impaired Vision - Assessment Eye Alignment:  Within Functional Limits Ocular Range of Motion: Within Functional Limits Alignment/Gaze Preference: Gaze right Tracking/Visual Pursuits: Decreased smoothness of horizontal tracking;Requires cues, head turns, or add eye shifts to track;Unable to hold eye position out of midline;Decreased smoothness of vertical tracking Perception Perception: Impaired (improved since date of eval) Inattention/Neglect: Does not attend to left side of body Praxis Praxis: Impaired Praxis Impairment Details: Initiation;Motor planning  Cognition Overall Cognitive Status: Impaired/Different from baseline Arousal/Alertness: Awake/alert Orientation Level: Oriented X4 Attention: Sustained;Selective Focused Attention: Appears intact Sustained Attention: Appears intact Selective Attention: Impaired Selective Attention Impairment: Functional complex;Verbal complex (pragmatics vs personality) Memory: Impaired (mod I with strategies) Awareness: Appears intact Awareness Impairment: Anticipatory impairment Problem Solving: Impaired Problem Solving Impairment: Verbal complex Sequencing: Impaired Sequencing Impairment: Verbal complex;Functional complex Safety/Judgment: Impaired Sensation Sensation Light Touch: Impaired Detail Light Touch Impaired Details: Impaired LUE;Impaired LLE Hot/Cold: Not tested Proprioception: Impaired Detail Proprioception Impaired Details: Impaired LLE;Impaired LUE Additional Comments: Absent distally on LLE Coordination Gross Motor Movements are Fluid and Coordinated: No Fine Motor Movements are Fluid and Coordinated: No Coordination and Movement Description: Moderate L hemi that has improved since evaluation due to both motor strength and awareness Heel Shin Test: equal ROM B, but slightly dysmetric L LE (improved since date of eval) Motor  Motor Motor: Hemiplegia Motor - Discharge Observations: L hemi that has improved. LUE > LLE weakness  Mobility Bed Mobility Bed Mobility:  Sit to Supine;Supine to Sit Supine to Sit: Independent with assistive device Sit to Supine: Independent with assistive device Transfers Transfers: Sit to Stand;Stand to Sit;Stand Pivot Transfers Sit to Stand: Contact Guard/Touching assist Stand to Sit: Contact Guard/Touching assist Stand Pivot Transfers: Contact Guard/Touching assist Stand Pivot Transfer Details: Verbal cues for technique;Verbal cues for sequencing;Verbal cues for precautions/safety;Verbal cues for safe use of DME/AE Stand Pivot Transfer Details (indicate cue type and reason): cues for hand  placement and awareness of LUE into RW splint Transfer (Assistive device): Rolling walker Locomotion  Gait Ambulation: Yes Gait Assistance: Contact Guard/Touching assist Gait Distance (Feet): 150 Feet Assistive device: Rolling walker Gait Assistance Details: Verbal cues for precautions/safety;Verbal cues for safe use of DME/AE;Verbal cues for gait pattern Gait Assistance Details: L ankle inversion. Using AIRCAST to manage LLE Gait Gait: Yes Gait Pattern: Impaired Gait Pattern: Step-through pattern;Narrow base of support;Decreased step length - right;Left circumduction;Left foot flat;Lateral trunk lean to left;Trunk rotated posteriorly on left;Poor foot clearance - left Stairs / Additional Locomotion Stairs: Yes Stairs Assistance: Contact Guard/Touching assist Stair Management Technique: One rail Right Number of Stairs: 12 Height of Stairs: 6 Pick up small object from the floor assist level: Minimal Assistance - Patient > 75% Wheelchair Mobility Wheelchair Mobility: Yes Wheelchair Assistance: Minimal assistance - Patient >75% Wheelchair Propulsion: Right lower extremity;Right upper extremity Wheelchair Parts Management: Needs assistance Distance: 150  Trunk/Postural Assessment  Cervical Assessment Cervical Assessment: Within Functional Limits Thoracic Assessment Thoracic Assessment: Within Functional Limits Lumbar  Assessment Lumbar Assessment: Within Functional Limits Postural Control Postural Control: Deficits on evaluation (delayed, mild)  Balance Balance Balance Assessed: Yes Static Sitting Balance Static Sitting - Balance Support: Feet unsupported;No upper extremity supported Static Sitting - Level of Assistance: 7: Independent Dynamic Sitting Balance Dynamic Sitting - Balance Support: Feet supported;No upper extremity supported Dynamic Sitting - Level of Assistance: 5: Stand by assistance Static Standing Balance Static Standing - Balance Support: Bilateral upper extremity supported Static Standing - Level of Assistance: 5: Stand by assistance Dynamic Standing Balance Dynamic Standing - Balance Support: During functional activity Dynamic Standing - Level of Assistance: 4: Min assist Extremity Assessment      RLE Assessment RLE Assessment: Within Functional Limits LLE Assessment LLE Assessment: Exceptions to [facility] LLE Strength LLE Overall Strength: Deficits Left Hip Flexion: 3-/5 Left Knee Flexion: 3-/5 Left Knee Extension: 4/5 Left Ankle Dorsiflexion: 2+/5 Left Ankle Eversion: 3/5    Soha Thorup P Shaurya Rawdon PT, DPT 06/16/2021, 12:56 PM

## 2021-06-16 NOTE — Patient Care Conference (Signed)
Inpatient RehabilitationTeam Conference and Plan of Care Update Date: 06/16/2021   Time: 11:32 AM    Patient Name: Monica Miranda      Medical Record Number: [id number]  Date of Birth: 03-05-1961 Sex: Female         Room/Bed: 4M12C/4M12C-01 Payor Info: Payor: MEDICAID PENDING / Plan: MEDICAID PENDING / Product Type: *No Product type* /    Admit Date/Time:  05/29/2021  3:18 PM  Primary Diagnosis:  ICH (intracerebral hemorrhage) [facility])  Hospital Problems: Principal Problem:   ICH (intracerebral hemorrhage) [facility])    Expected Discharge Date: Expected Discharge Date: 06/17/21  Team Members Present: Physician leading conference: Dr. Sula Soda Social Worker Present: Dossie Der, LCSW Nurse Present: Chana Bode, RN PT Present: Wynelle Link, PT OT Present: Roney Mans, OT SLP Present: Colin Benton, SLP PPS Coordinator present : Fae Pippin, SLP     Current Status/Progress Goal Weekly Team Focus  Bowel/Bladder   cont of B/B. last BM 06/14/21  remain cont.  assess q shift and PRN   Swallow/Nutrition/ Hydration             ADL's             Mobility   supervision bed mobility, CGA sit<>stand transfers and minA stand<>pivot transfers with RW. minA gait 134ft with RW. Remains apraxic, L inattention, hyperverbose, highly distractable.  CGA/minA  DC planning, family education/training, safety awareness, NMR with gait progressions and dynamic standing balance   Communication   pragamtic deficits verse personality         Safety/Cognition/ Behavioral Observations  sup A- mod I  mod I-to-sup A  education, complex problem solving, selective attention and recall skills   Pain   c/o migraine h/a. Pain manag. effective  pain<3  assess pain qshift and PRN   Skin   skin intact  Maintain skin integrity  assess skin q shift and PRN     Discharge Planning:  Daughter in-law [name] here for education and aware pt will require 24/7 physical care at DC. Equipment delivered  and trying to get [facility]. New PCP appointment set up family aware.   Team Discussion: MD adjusting meds for migraines and stress management education.  Patient on target to meet rehab goals: yes, currently CGA for stand pivot transfers. Progress limited by left inattention, problem solving deficits and memory issues. Needs min assist for steps and toileting. Requires supervision - mod I for cognitive function and complex problem solving.   *See Care Plan and progress notes for long and short-term goals.   Revisions to Treatment Plan:  Opthamology referral OP post d/c Neurology follow up OP post d/c   Teaching Needs: Safety, medications, secondary risk management, transfers, etc  Current Barriers to Discharge: Decreased caregiver support  Possible Resolutions to Barriers: Family education completed [facility] follow up services for PT/OT established     Medical Summary Current Status: migraines, whole body jerking  Barriers to Discharge: Medical stability  Barriers to Discharge Comments: migraines, whole body jerking Possible Resolutions to [employer] Focus: continue medications/supplements, EEG performed and shows no seizure activity, findings likely secondary to ICH, discussed with her daughter and patient   Continued Need for Acute Rehabilitation Level of Care: The patient requires daily medical management by a physician with specialized training in physical medicine and rehabilitation for the following reasons: Direction of a multidisciplinary physical rehabilitation program to maximize functional independence : Yes Medical management of patient stability for increased activity during participation in an intensive rehabilitation regime.: Yes Analysis  of laboratory values and/or radiology reports with any subsequent need for medication adjustment and/or medical intervention. : Yes   I attest that I was present, lead the team conference, and concur with the assessment and  plan of the team.   Chana Bode B 06/16/2021, 11:39 AM

## 2021-06-16 NOTE — Progress Notes (Signed)
Recreational Therapy Session Note  Patient Details  Name: Monica Miranda MRN: [id number] Date of Birth: 1961-04-08 Today's Date: 06/16/2021  Pain: no c/o Skilled Therapeutic Interventions/Progress Updates: Pt sitting EOB listening to music upon arrival..the patient stated for increased mood.  Session focused on emotional support, dynamic sitting balance, attending to the left, awareness, and LUE use during scheduled Animal Assisted Therapy with Pet Partner team.  Pt sat EOB, stating excitement to see our Pet Partner Team.  Pt participated in animal/pet focused discussion, including the care of her dogs at home while petting the dog with RUE, incorporating LUE intermittently with supervision.  Pt demonstrated intellectual awareness of deficits, but required min - mod verbal cues for visual scanning and subtle cues of balance seated EOB.  Pt stated appreciation to LRT and Pet Partner Team for this session.  Therapy/Group: Individual Therapy  Milee Qualls 06/16/2021, 3:56 PM

## 2021-06-17 ENCOUNTER — Other Ambulatory Visit (HOSPITAL_COMMUNITY): Payer: Self-pay

## 2021-06-17 NOTE — Progress Notes (Signed)
Recreational Therapy Discharge Summary Patient Details  Name: RAVAN SCHLEMMER MRN: [id number] Date of Birth: [birth year] Today's Date: 06/17/2021  Long term goals set: 1  Long term goals met: 1  Comments on progress toward goals: Pt has made good progress during LOS and is scheduled for discharge home today at overall min assist level.  TR sessions/education focused on leisure education, activity analysis/modification, coping strategies & animal assisted therapies.  Pt continues to be limited by decreased awareness during functional tasks, needing verbal cues or assistance to complete them.  Pt is appreciative of care on CIR but is excited about going home to her "fur babies."  Reasons goals not met: n/a  Equipment acquired: n/a  Reasons for discharge: discharge from hospital  Follow-up: [place] agrees with progress made and goals achieved: Yes  Novia Lansberry 06/17/2021, 12:40 PM

## 2021-06-17 NOTE — Progress Notes (Signed)
PROGRESS NOTE   Subjective/Complaints: Stable for d/c today  ROS:   Pt denies SOB, abd pain, CP, N/V/C/D, and vision changes HA and photophobia are the same/slightly better, +jerking activity of left side, +stuttering  Objective:   No results found. No results for input(s): WBC, HGB, HCT, PLT in the last 72 hours.   No results for input(s): NA, K, CL, CO2, GLUCOSE, BUN, CREATININE, CALCIUM in the last 72 hours.    Intake/Output Summary (Last 24 hours) at 06/17/2021 0912 Last data filed at 06/17/2021 0855 Gross per 24 hour  Intake 537 ml  Output --  Net 537 ml      Pressure Injury 05/25/21 Sacrum Medial;Lower Stage 1 -  Intact skin with non-blanchable redness of a localized area usually over a bony prominence. (Active)  05/25/21 2115  Location: Sacrum  Location Orientation: Medial;Lower  Staging: Stage 1 -  Intact skin with non-blanchable redness of a localized area usually over a bony prominence.  Wound Description (Comments):   Present on Admission: Yes    Physical Exam: Vital Signs Blood pressure (!) 133/93, pulse 69, temperature 97.8 F (36.6 C), temperature source Oral, resp. rate 16, height 5\' 1"  (1.549 m), weight 44.2 kg, SpO2 94 %.  Gen: no distress, normal appearing HEENT: oral mucosa pink and moist, NCAT Cardio: Reg rate Chest: normal effort, normal rate of breathing Abd: soft, non-distended Ext: no edema Psychiatric: appropriate- anxious about needing to void Neurological: Alert- speech much more clear; still stih some stuttering;  hoffman's and 2-3 beats clonus LLE- light off due to photophobia Ext: left hand swelling.  Psych: Pt's affect is appropriate. Pt is cooperative, Hyperverbose Skin: Clean and intact without signs of breakdown Neuro:  pt is alert and oriented to person, place. Poor insight into deficits. Left central 7, right gaze preference. Mild dysarthria. Poor concentration. LUE 1 to  1+/5. LLE 1+ to 2/5 HE, KE and 0/5 distally. Decreased LT left arm and leg. DTR's 3+ LLE and LUE with early extensor and flexor tone respectively Musculoskeletal: Full ROM, No pain with AROM or PROM in the neck, trunk, or extremities. Posture appropriate. Improved ambulation with RW GU: some incontinent episodes    Assessment/Plan: 1. Functional deficits which require 3+ hours per day of interdisciplinary therapy in a comprehensive inpatient rehab setting. Physiatrist is providing close team supervision and 24 hour management of active medical problems listed below. Physiatrist and rehab team continue to assess barriers to discharge/monitor patient progress toward functional and medical goals  Care Tool:  Bathing  Bathing activity did not occur: Safety/medical concerns (no time/declined) Body parts bathed by patient: Right arm, Left arm, Chest, Abdomen, Front perineal area, Buttocks, Right upper leg, Left upper leg, Right lower leg, Left lower leg, Face         Bathing assist Assist Level: Contact Guard/Touching assist     Upper Body Dressing/Undressing Upper body dressing   What is the patient wearing?: Pull over shirt    Upper body assist Assist Level: Minimal Assistance - Patient > 75%    Lower Body Dressing/Undressing Lower body dressing      What is the patient wearing?: Pants, Underwear/pull up  Lower body assist Assist for lower body dressing: Minimal Assistance - Patient > 75%     Toileting Toileting Toileting Activity did not occur Landscape architect and hygiene only): N/A (no void or bm)  Toileting assist Assist for toileting: Minimal Assistance - Patient > 75%     Transfers Chair/bed transfer  Transfers assist     Chair/bed transfer assist level: Contact Guard/Touching assist     Locomotion Ambulation   Ambulation assist      Assist level: Contact Guard/Touching assist Assistive device: Walker-rolling Max distance: 183ft   Walk 10 feet  activity   Assist     Assist level: Contact Guard/Touching assist Assistive device: Walker-rolling   Walk 50 feet activity   Assist Walk 50 feet with 2 turns activity did not occur: Safety/medical concerns  Assist level: Contact Guard/Touching assist Assistive device: Walker-rolling    Walk 150 feet activity   Assist Walk 150 feet activity did not occur: Safety/medical concerns  Assist level: Contact Guard/Touching assist Assistive device: Walker-rolling    Walk 10 feet on uneven surface  activity   Assist Walk 10 feet on uneven surfaces activity did not occur: Safety/medical concerns   Assist level: Minimal Assistance - Patient > 75% Assistive device: Walker-rolling   Wheelchair     Assist Is the patient using a wheelchair?: Yes Type of Wheelchair: Manual    Wheelchair assist level: Minimal Assistance - Patient > 75% Max wheelchair distance: 50ft    Wheelchair 50 feet with 2 turns activity    Assist        Assist Level: Total Assistance - Patient < 25%   Wheelchair 150 feet activity     Assist      Assist Level: Total Assistance - Patient < 25%   Blood pressure (!) 133/93, pulse 69, temperature 97.8 F (36.6 C), temperature source Oral, resp. rate 16, height 5\' 1"  (1.549 m), weight 44.2 kg, SpO2 94 %.  Medical Problem List and Plan: 1.  Left-sided weakness functional deficits secondary to right thalamic/basal ganglia/frontal temporal ICH in the setting of hypertension as well as cocaine use             -patient may shower             -ELOS/Goals: 18-24 days, min assist with PT, OT, sup/min SLP             -left WFO, PRAFO's ordered  D/c home today  -discussed timeline of healing from [place]  TC scheduled with me 1/23  Encouraged on her participation despite headache  Discussed benefits of water therapy outaptient in conjunction with her neuro-rehab  Went over (-) CT for new stroke.  2.  Impaired mobility: continue Antiembolism  stockings, thigh (TED hose) Bilateral lower extremities             -antiplatelet therapy: N/A 3.Headache: Continue topamax to 100mg  BID. CT head stable- discussed that midline shift has improved to 44mm. Discontinue magnesium. Edited nursing order to include administration of Bacopa as well.  4. Anxiety:  Provide emotional support, Therapeutic rec consulted to provide information on bringing in her pet dogs for support.              -antipsychotic agents: N/A 5. Neuropsych: This patient is capable of making decisions on her own behalf. 6. Skin/Wound Care: Routine skin checks 7. Fluids/Electrolytes/Nutrition: encourage fluids  -check labs 12/26 8.  Hypertension.  d/c lisinopril.  Monitor with increased mobility  1/8- BP controlled- con't regimen 9.  Seizure  prophylaxis. Discontinue Keppra 10.  Hyperlipidemia.  Continue Statin held given ICH. 11.  History of polysubstance abuse.  Provide counseling 12.  History of COPD/tobacco use.  Monitor oxygen saturations every shift. 13. Constipation:             -good results with sorbitol overnight 14. Decreased appetite: edited diet order with her preferences. 15. Low protein levels: discussed good protein sources  16. Transaminitis: Patient may use on med NAC from home- placed order.  17. Constipation: increase magnesium gluconate to 250mg  BID. Resolved. Discussed that magnesium level is normal. 18. Right rib pain: rib XR ordered. Discussed that results are normal.  19. Left lower extremity weakness: aircast ordered 20. Cluster headaches: 100% O2 via nonrebreather ordered PRN, discussed with patient and nursing, asked nursing to scheduled fitting of nonrebreather this morning.  21. Nausea: d/c magnesium. Zofran ordered.  22. B12 deficiency: Discussed B12 level is elevated and homocysteine level is normal.  23. Neuropathy: B12 and folate levels checked and within normal limits. F/u B6 24. Bradycardia: medications reviewed, continue to monitor TID.   25. Hypotension: medications reviewed, decrease tramadol to q8H prn.  26. Left upper extremity swelling: discussed with daughter bringing in compression garments for her 27. Spasticity- think it's the cause of her "convulsions" sounds like spasms more than localized seizure activity- d/w pt- will try Baclofen PRN, not scheduled. 1/8- no more spasms episodes- con't prn meds  28. Nasal congestion-   1/7- flonase- will order 29. Active smoker: Pt wants nicotine patch when goes home, will prescribe 30. Vitamin D deficiency: start ergocalciferol 50,000U once per week for 7 weeks.  72. Jerking activity of left side: Neurology note reviewed: EEG negative for seizure, more consistent with ICH. Discussed with patient and her daughter     LOS: 19 days A FACE TO [place] 06/17/2021, 9:12 AM

## 2021-06-22 ENCOUNTER — Telehealth: Payer: Self-pay

## 2021-06-22 NOTE — Telephone Encounter (Signed)
Transition Care Management Unsuccessful Follow-up Telephone Call  Date of discharge and from where:  06/17/21 from [facility]  Attempts:  1st Attempt  Reason for unsuccessful TCM follow-up call:  Unable to reach patient; Daughter states she has been out of town and patient has been staying with brother and sister in [place]

## 2021-06-22 NOTE — Telephone Encounter (Signed)
Transition Care Management Unsuccessful Follow-up Telephone Call  Date of discharge and from where:  06/17/21 from [facility]  Attempts:  2nd Attempt  Reason for unsuccessful TCM follow-up call:  Left voice message

## 2021-06-22 NOTE — Telephone Encounter (Signed)
Transition Care Management Unsuccessful Follow-up Telephone Call  Date of discharge and from where:  06/17/21 from [facility]  Attempts:  3rd Attempt  Reason for unsuccessful TCM follow-up call:  Left voice message

## 2021-06-28 ENCOUNTER — Encounter: Payer: Self-pay | Admitting: Physical Medicine and Rehabilitation

## 2021-06-28 ENCOUNTER — Other Ambulatory Visit: Payer: Self-pay

## 2021-06-28 ENCOUNTER — Encounter
Payer: Medicaid Other | Attending: Physical Medicine and Rehabilitation | Admitting: Physical Medicine and Rehabilitation

## 2021-06-28 ENCOUNTER — Telehealth: Payer: Self-pay

## 2021-06-28 VITALS — BP 127/85 | HR 79 | Ht 62.0 in | Wt 95.2 lb

## 2021-06-28 DIAGNOSIS — R29898 Other symptoms and signs involving the musculoskeletal system: Secondary | ICD-10-CM | POA: Diagnosis present

## 2021-06-28 DIAGNOSIS — M62838 Other muscle spasm: Secondary | ICD-10-CM | POA: Diagnosis present

## 2021-06-28 DIAGNOSIS — I610 Nontraumatic intracerebral hemorrhage in hemisphere, subcortical: Secondary | ICD-10-CM | POA: Insufficient documentation

## 2021-06-28 DIAGNOSIS — H539 Unspecified visual disturbance: Secondary | ICD-10-CM | POA: Diagnosis present

## 2021-06-28 NOTE — Progress Notes (Signed)
Subjective:    Patient ID: Monica Miranda, female    DOB: [birth year], 61 y.o.   MRN: [id number]  HPI Monica Miranda is a 61 year old woman who presents for hospital follow-up after CIR admission for hemorrhagic stroke.   1) Hemorrhagic stroke -she has brought in her home health forms -she wanted to see me 1:1 today  2) Left ankle pain: -she is waking up at night with pain due to the [facility] brace, her daughter-in-law adjusted the brace and she feels this caused her to have pain -she brings in the [facility] boot today.  -she wants to wear the [facility] so her foot does not become inverted.   3) Left upper extremity weakness -strength has improved  4) Muscle spasms -more frequent -she prefers the baclofen over the tramadol. It does make her a little sleepy   Pain Inventory Average Pain 6 Pain Right Now 0 My pain is  throbbing  LOCATION OF PAIN  foot  BOWEL Number of stools per week: 7 Oral laxative use No  Type of laxative . Enema or suppository use No  History of colostomy No  Incontinent No   BLADDER Normal In and out cath, frequency . Able to self cath  na Bladder incontinence No  Frequent urination No  Leakage with coughing No  Difficulty starting stream No  Incomplete bladder emptying No    Mobility walk without assistance how many minutes can you walk? 20-30 ability to climb steps?  yes do you drive?  no  Function disabled: date disabled . I need assistance with the following:  household duties  Neuro/Psych weakness numbness tingling spasms  Prior Studies TC appt  Physicians involved in your care TC appt   No family history on file. 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   Sexual activity: Not on file  Other Topics Concern   Not on file  Social History Narrative   Not on file   Social Determinants of Health   Financial Resource Strain: Not on file  Food Insecurity: Not on file  Transportation Needs: Not on file  Physical Activity: Not on file  Stress: Not on file  Social Connections: Not on file   Past Surgical History:  Procedure Laterality Date   ESOPHAGEAL DILATION     Past Medical History:  Diagnosis Date   COPD (chronic obstructive pulmonary disease) (HCC)    Rib fracture    4 months ago   Stroke (HCC)    BP 127/85    Pulse 79    Ht 5\' 2"  (1.575 m)    Wt 95 lb 3.2 oz (43.2 kg)    SpO2 95%    BMI 17.41 kg/m   Opioid Risk Score:   Fall Risk Score:  `  1  Depression screen PHQ 2/9  Depression screen PHQ 2/9 06/28/2021  Decreased Interest 0  Down, Depressed, Hopeless 0  PHQ - 2 Score 0  Altered sleeping 0  Tired, decreased energy 0  Change in appetite 0  Feeling bad or failure about yourself  0  Trouble concentrating 0  Moving slowly or fidgety/restless 0  Suicidal thoughts 0  PHQ-9 Score 0     Review of Systems  Musculoskeletal:  Positive for gait problem.       Spasms  Neurological:  Positive for weakness.  All other systems reviewed and are negative.     Objective:   Physical Exam        Assessment & Plan:  1) muscle spasms -discussed that this could be a sign of motor recovery.  -continue baclofen prn. -can stop tramadol -continue bacopa.  -Provided with a pain relief journal and discussed that it contains foods and lifestyle tips to naturally help to improve pain. Discussed that these lifestyle strategies  are also very good for health unlike some medications which can have negative side effects. Discussed that the act of keeping a journal can be therapeutic and helpful to realize patterns what helps to trigger and alleviate pain.    2) Left lower extremity weakness: -adjusted her PRAFO  3) SAH: -continue therapies -provided dietary and exercise counseling -discussed that hypertension is number one reversible risk factor for stroke.   -recommended only repeating Head CT if needed, we expect blood to resorb -we can repeat CT scan in September to see if swelling has resolved.

## 2021-06-28 NOTE — Telephone Encounter (Signed)
Larita Fife and Carrabelle from [employer]. Order brung in with patient HHPT 1wk1, 2wk4.

## 2021-06-29 ENCOUNTER — Telehealth: Payer: Self-pay | Admitting: *Deleted

## 2021-06-29 MED ORDER — DIPHENHYDRAMINE HCL 25 MG PO CAPS: 25.0000 mg | ORAL_CAPSULE | Freq: Four times a day (QID) | ORAL | 0 refills | Status: DC | PRN

## 2021-06-29 NOTE — Addendum Note (Signed)
Addended by: Horton Chin on: 06/29/2021 11:18 AM   Modules accepted: Orders

## 2021-06-29 NOTE — Telephone Encounter (Signed)
Monica Miranda called about her referral to ophthalmology paperwork(?) that was discussed yesterday. She also is asking if she can take benedryl for her face swelling and itching.

## 2021-07-13 ENCOUNTER — Other Ambulatory Visit: Payer: Self-pay | Admitting: Physical Medicine and Rehabilitation

## 2021-07-13 ENCOUNTER — Telehealth: Payer: Self-pay | Admitting: *Deleted

## 2021-07-13 MED ORDER — VITAMIN D (ERGOCALCIFEROL) 1.25 MG (50000 UNIT) PO CAPS: 50000.0000 [IU] | ORAL_CAPSULE | ORAL | 0 refills | Status: DC

## 2021-07-13 NOTE — Telephone Encounter (Signed)
Monica Miranda called about a refill on her vitamin d.

## 2021-07-14 ENCOUNTER — Ambulatory Visit: Payer: Self-pay | Attending: Nurse Practitioner | Admitting: Nurse Practitioner

## 2021-07-27 ENCOUNTER — Telehealth: Payer: Self-pay

## 2021-07-27 NOTE — Telephone Encounter (Signed)
Patient called to ask for a return call from Dr. Ranell Patrick. She did not specify the reason for the call

## 2021-07-28 ENCOUNTER — Encounter: Payer: Self-pay | Attending: Physical Medicine and Rehabilitation | Admitting: Physical Medicine and Rehabilitation

## 2021-07-28 ENCOUNTER — Telehealth: Payer: Self-pay

## 2021-07-28 ENCOUNTER — Other Ambulatory Visit: Payer: Self-pay

## 2021-07-28 DIAGNOSIS — R29898 Other symptoms and signs involving the musculoskeletal system: Secondary | ICD-10-CM | POA: Insufficient documentation

## 2021-07-28 DIAGNOSIS — H539 Unspecified visual disturbance: Secondary | ICD-10-CM | POA: Insufficient documentation

## 2021-07-28 DIAGNOSIS — M62838 Other muscle spasm: Secondary | ICD-10-CM | POA: Insufficient documentation

## 2021-07-28 DIAGNOSIS — I610 Nontraumatic intracerebral hemorrhage in hemisphere, subcortical: Secondary | ICD-10-CM | POA: Insufficient documentation

## 2021-07-28 NOTE — Telephone Encounter (Signed)
ERROR

## 2021-07-28 NOTE — Telephone Encounter (Signed)
Spoke with patient and she stated she had not heard from Dr. Carlis Abbott. I informed her that she called and left a message. She is having trouble getting her medications due to cost. She wants to know if they can be sent to [facility] [phone]. Patient would like to be called on [phone]

## 2021-07-28 NOTE — Telephone Encounter (Signed)
Patient called stating she in reference of a medication

## 2021-07-28 NOTE — Telephone Encounter (Addendum)
Patient called stating she needs refills on medications for migraines. She stated she does not have insurance right now and was told about a clinic that could possibly give her the medicines for cheaper. She does not know the name of the clinic. She wants to know if Dr. Ranell Patrick knows of and clinics or pharmacies she can get her medications cheaper. I informed her that Dr. Ranell Patrick will be giving her a call today at 11:40 am and if she finds it out, she can discuss that with her.

## 2021-07-29 ENCOUNTER — Telehealth: Payer: Self-pay

## 2021-07-29 ENCOUNTER — Other Ambulatory Visit: Payer: Self-pay | Admitting: Physical Medicine and Rehabilitation

## 2021-07-29 NOTE — Telephone Encounter (Signed)
Left voicemail to return call to inform us of which medications she needs sent to [facility]

## 2021-07-29 NOTE — Telephone Encounter (Signed)
She needs all of them.

## 2021-07-29 NOTE — Telephone Encounter (Signed)
Please send refills to  the [facility]. Thank you

## 2021-07-29 NOTE — Telephone Encounter (Signed)
Dr. Carlis Abbott requested patient to be called back to see what medications that are need? Generic message was left on her voice mail, for call back.

## 2021-08-03 ENCOUNTER — Other Ambulatory Visit: Payer: Self-pay | Admitting: Physical Medicine and Rehabilitation

## 2021-08-03 MED ORDER — ALBUTEROL SULFATE HFA 108 (90 BASE) MCG/ACT IN AERS: 1.0000 | INHALATION_SPRAY | Freq: Four times a day (QID) | RESPIRATORY_TRACT | 0 refills | Status: DC | PRN

## 2021-08-03 MED ORDER — PANTOPRAZOLE SODIUM 40 MG PO TBEC: 40.0000 mg | DELAYED_RELEASE_TABLET | Freq: Every day | ORAL | 0 refills | Status: DC

## 2021-08-03 MED ORDER — CYANOCOBALAMIN 1000 MCG PO TABS: 1000.0000 ug | ORAL_TABLET | Freq: Every day | ORAL | 0 refills | Status: DC

## 2021-08-03 MED ORDER — FOLIC ACID 1 MG PO TABS: 1.0000 mg | ORAL_TABLET | Freq: Every day | ORAL | 0 refills | Status: DC

## 2021-08-03 MED ORDER — ADULT MULTIVITAMIN W/MINERALS CH: 1.0000 | ORAL_TABLET | Freq: Every day | ORAL | Status: DC

## 2021-08-03 MED ORDER — BUTALBITAL-APAP-CAFFEINE 50-325-40 MG PO TABS: 1.0000 | ORAL_TABLET | Freq: Four times a day (QID) | ORAL | 0 refills | Status: DC | PRN

## 2021-08-03 MED ORDER — TOPIRAMATE 100 MG PO TABS: 100.0000 mg | ORAL_TABLET | Freq: Two times a day (BID) | ORAL | 0 refills | Status: DC

## 2021-08-03 MED ORDER — BACLOFEN 10 MG PO TABS: 10.0000 mg | ORAL_TABLET | Freq: Three times a day (TID) | ORAL | 0 refills | Status: DC | PRN

## 2021-08-03 MED ORDER — VITAMIN D (ERGOCALCIFEROL) 1.25 MG (50000 UNIT) PO CAPS: 50000.0000 [IU] | ORAL_CAPSULE | ORAL | 0 refills | Status: DC

## 2021-08-03 MED ORDER — MELATONIN 3 MG PO TABS: 3.0000 mg | ORAL_TABLET | Freq: Every evening | ORAL | 0 refills | Status: DC | PRN

## 2021-08-03 MED ORDER — POLYETHYLENE GLYCOL 3350 17 G PO PACK: 17.0000 g | PACK | Freq: Every day | ORAL | 0 refills | Status: DC

## 2021-08-03 NOTE — Telephone Encounter (Signed)
Per patient task has been completed [place].

## 2021-08-03 NOTE — Telephone Encounter (Signed)
Spoke with patient and she stated the  [facility] does not fill migraine medications because they see it as pain medication. I informed her that goodrx can be used for medications at certain pharmacies. She wanted the Fioricet so I looked it up for 10 tablets and it is $9. She would like [facility] [place]. I have added the pharmacy to the snapshot

## 2021-09-03 ENCOUNTER — Telehealth: Payer: Self-pay

## 2021-09-03 ENCOUNTER — Other Ambulatory Visit: Payer: Self-pay | Admitting: Physical Medicine and Rehabilitation

## 2021-09-03 MED ORDER — ONDANSETRON HCL 4 MG PO TABS: 4.0000 mg | ORAL_TABLET | Freq: Three times a day (TID) | ORAL | 0 refills | Status: DC | PRN

## 2021-09-03 NOTE — Telephone Encounter (Signed)
A generic message left on patient voice mail. Rx has been sent. ?

## 2021-09-03 NOTE — Telephone Encounter (Signed)
Dr. Carlis Abbott in not in the office today.  ? ?Patient is aware that Dr. Carlis Abbott is not in the office. She wanted to know what she could  take OTC for nightly nausea? ? ? Patient has been advised to check with the Pharmacy or see a her PCP or Urgent Care.  ?

## 2021-09-16 ENCOUNTER — Other Ambulatory Visit: Payer: Self-pay | Admitting: Physical Medicine and Rehabilitation

## 2021-09-16 ENCOUNTER — Telehealth: Payer: Self-pay | Admitting: *Deleted

## 2021-09-16 MED ORDER — NICOTINE 21 MG/24HR TD PT24: MEDICATED_PATCH | TRANSDERMAL | 0 refills | Status: DC

## 2021-09-16 MED ORDER — VITAMIN D (ERGOCALCIFEROL) 1.25 MG (50000 UNIT) PO CAPS: 50000.0000 [IU] | ORAL_CAPSULE | ORAL | 0 refills | Status: DC

## 2021-09-16 MED ORDER — FOLIC ACID 1 MG PO TABS: 1.0000 mg | ORAL_TABLET | Freq: Every day | ORAL | 0 refills | Status: DC

## 2021-09-16 MED ORDER — PANTOPRAZOLE SODIUM 40 MG PO TBEC: 40.0000 mg | DELAYED_RELEASE_TABLET | Freq: Every day | ORAL | 0 refills | Status: DC

## 2021-09-16 MED ORDER — TOPIRAMATE 100 MG PO TABS: 100.0000 mg | ORAL_TABLET | Freq: Two times a day (BID) | ORAL | 0 refills | Status: DC

## 2021-09-16 MED ORDER — CYANOCOBALAMIN 1000 MCG PO TABS: 1000.0000 ug | ORAL_TABLET | Freq: Every day | ORAL | 0 refills | Status: DC

## 2021-09-16 MED ORDER — MELATONIN 3 MG PO TABS: 3.0000 mg | ORAL_TABLET | Freq: Every evening | ORAL | 0 refills | Status: DC | PRN

## 2021-09-16 MED ORDER — DIPHENHYDRAMINE HCL 25 MG PO CAPS: 25.0000 mg | ORAL_CAPSULE | Freq: Four times a day (QID) | ORAL | 0 refills | Status: DC | PRN

## 2021-09-16 MED ORDER — ALBUTEROL SULFATE HFA 108 (90 BASE) MCG/ACT IN AERS: 1.0000 | INHALATION_SPRAY | Freq: Four times a day (QID) | RESPIRATORY_TRACT | 0 refills | Status: DC | PRN

## 2021-09-16 MED ORDER — POLYETHYLENE GLYCOL 3350 17 G PO PACK: 17.0000 g | PACK | Freq: Every day | ORAL | 0 refills | Status: DC

## 2021-09-16 MED ORDER — BACLOFEN 10 MG PO TABS: 10.0000 mg | ORAL_TABLET | Freq: Three times a day (TID) | ORAL | 0 refills | Status: DC | PRN

## 2021-09-16 NOTE — Telephone Encounter (Signed)
Mrs Metzel called and is requesting Dr Ranell Patrick refill all her medication at the [facility]. ?

## 2021-09-17 NOTE — Telephone Encounter (Signed)
Notified they were refilled. ?

## 2021-09-20 ENCOUNTER — Telehealth: Payer: Self-pay | Admitting: *Deleted

## 2021-09-20 NOTE — Telephone Encounter (Signed)
Monica Miranda called and is requesting a refill on her Fioricet sent in to [facility]. ?

## 2021-09-21 ENCOUNTER — Other Ambulatory Visit: Payer: Self-pay | Admitting: Physical Medicine and Rehabilitation

## 2021-09-21 MED ORDER — BUTALBITAL-APAP-CAFFEINE 50-325-40 MG PO TABS: 1.0000 | ORAL_TABLET | Freq: Four times a day (QID) | ORAL | 0 refills | Status: DC | PRN

## 2021-10-04 ENCOUNTER — Other Ambulatory Visit: Payer: Self-pay

## 2021-10-04 ENCOUNTER — Encounter (HOSPITAL_COMMUNITY): Payer: Self-pay | Admitting: Emergency Medicine

## 2021-10-04 ENCOUNTER — Emergency Department (HOSPITAL_COMMUNITY): Payer: Medicaid Other

## 2021-10-04 ENCOUNTER — Emergency Department (HOSPITAL_COMMUNITY)
Admission: EM | Admit: 2021-10-04 | Discharge: 2021-10-04 | Disposition: A | Discharge status: Home / Self Care | Payer: Medicaid Other | Attending: Emergency Medicine | Admitting: Emergency Medicine

## 2021-10-04 DIAGNOSIS — I69954 Hemiplegia and hemiparesis following unspecified cerebrovascular disease affecting left non-dominant side: Secondary | ICD-10-CM | POA: Diagnosis not present

## 2021-10-04 DIAGNOSIS — G8194 Hemiplegia, unspecified affecting left nondominant side: Secondary | ICD-10-CM

## 2021-10-04 DIAGNOSIS — R519 Headache, unspecified: Secondary | ICD-10-CM | POA: Insufficient documentation

## 2021-10-04 DIAGNOSIS — J449 Chronic obstructive pulmonary disease, unspecified: Secondary | ICD-10-CM | POA: Insufficient documentation

## 2021-10-04 LAB — I-STAT CHEM 8, ED
BUN: 23 mg/dL (ref 8–23)
Calcium, Ion: 1.22 mmol/L (ref 1.15–1.40)
Chloride: 109 mmol/L (ref 98–111)
Creatinine, Ser: 0.4 mg/dL — ABNORMAL LOW (ref 0.44–1.00)
Glucose, Bld: 106 mg/dL — ABNORMAL HIGH (ref 70–99)
HCT: 42 % (ref 36.0–46.0)
Hemoglobin: 14.3 g/dL (ref 12.0–15.0)
Potassium: 3.9 mmol/L (ref 3.5–5.1)
Sodium: 142 mmol/L (ref 135–145)
TCO2: 24 mmol/L (ref 22–32)

## 2021-10-04 LAB — CBC
HCT: 41.7 % (ref 36.0–46.0)
Hemoglobin: 13.6 g/dL (ref 12.0–15.0)
MCH: 27.6 pg (ref 26.0–34.0)
MCHC: 32.6 g/dL (ref 30.0–36.0)
MCV: 84.8 fL (ref 80.0–100.0)
Platelets: 317 10*3/uL (ref 150–400)
RBC: 4.92 MIL/uL (ref 3.87–5.11)
RDW: 16.2 % — ABNORMAL HIGH (ref 11.5–15.5)
WBC: 8 10*3/uL (ref 4.0–10.5)
nRBC: 0 % (ref 0.0–0.2)

## 2021-10-04 LAB — DIFFERENTIAL
Abs Immature Granulocytes: 0.04 10*3/uL (ref 0.00–0.07)
Basophils Absolute: 0 10*3/uL (ref 0.0–0.1)
Basophils Relative: 1 %
Eosinophils Absolute: 0.5 10*3/uL (ref 0.0–0.5)
Eosinophils Relative: 7 %
Immature Granulocytes: 1 %
Lymphocytes Relative: 14 %
Lymphs Abs: 1.1 10*3/uL (ref 0.7–4.0)
Monocytes Absolute: 0.6 10*3/uL (ref 0.1–1.0)
Monocytes Relative: 8 %
Neutro Abs: 5.6 10*3/uL (ref 1.7–7.7)
Neutrophils Relative %: 69 %

## 2021-10-04 LAB — CBG MONITORING, ED: Glucose-Capillary: 101 mg/dL — ABNORMAL HIGH (ref 70–99)

## 2021-10-04 LAB — COMPREHENSIVE METABOLIC PANEL
ALT: 18 U/L (ref 0–44)
AST: 22 U/L (ref 15–41)
Albumin: 3.4 g/dL — ABNORMAL LOW (ref 3.5–5.0)
Alkaline Phosphatase: 95 U/L (ref 38–126)
Anion gap: 6 (ref 5–15)
BUN: 19 mg/dL (ref 8–23)
CO2: 23 mmol/L (ref 22–32)
Calcium: 8.9 mg/dL (ref 8.9–10.3)
Chloride: 112 mmol/L — ABNORMAL HIGH (ref 98–111)
Creatinine, Ser: 0.5 mg/dL (ref 0.44–1.00)
GFR, Estimated: >60 mL/min (ref >60)
Glucose, Bld: 111 mg/dL — ABNORMAL HIGH (ref 70–99)
Potassium: 3.7 mmol/L (ref 3.5–5.1)
Sodium: 141 mmol/L (ref 135–145)
Total Bilirubin: 0.3 mg/dL (ref 0.3–1.2)
Total Protein: 5.9 g/dL — ABNORMAL LOW (ref 6.5–8.1)

## 2021-10-04 LAB — PROTIME-INR
INR: 1 (ref 0.8–1.2)
Prothrombin Time: 12.7 seconds (ref 11.4–15.2)

## 2021-10-04 LAB — APTT: aPTT: 34 seconds (ref 24–36)

## 2021-10-04 IMAGING — MR MR HEAD W/O CM
8 of 9 series · 35 of 48 positions shown · non-contrast
Comparison: CT head from the same day.  MRI [DATE].

CLINICAL DATA: Neuro deficit, acute, stroke suspected

EXAM:
MRI HEAD WITHOUT CONTRAST
TECHNIQUE: Multiplanar, multiecho pulse sequences of the brain and surrounding
structures were obtained without intravenous contrast.

[Series 3: T1 · sagittal · 5.0mm · 0.47mm/px · 3 of 23 slices shown]
[im 1/23]
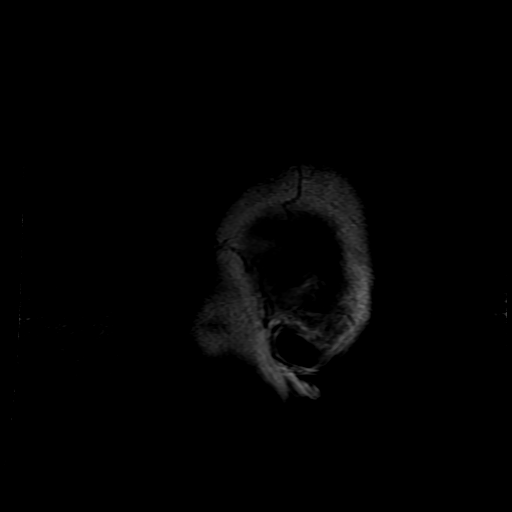
[im 12/23]
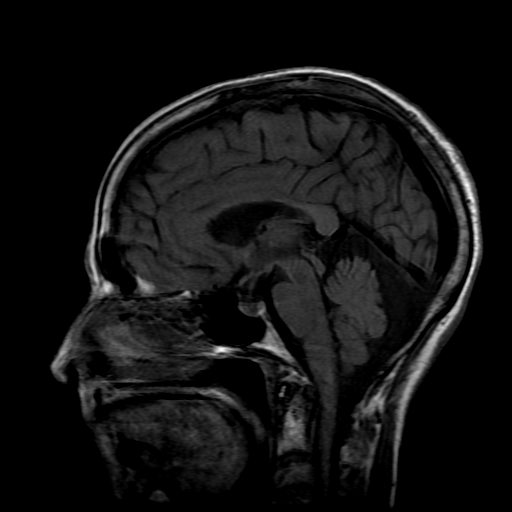
[im 23/23]
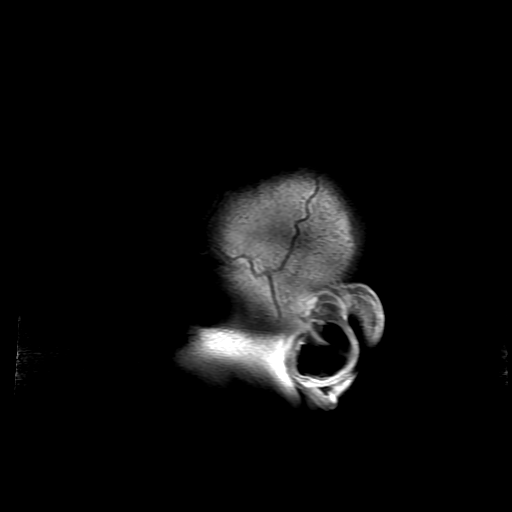

[Series 4: DWI · axial · 5.0mm · 1.09mm/px · z∈[-73,+70]mm · 7 of 62 slices shown (1 of 4)]
[im 1/62]
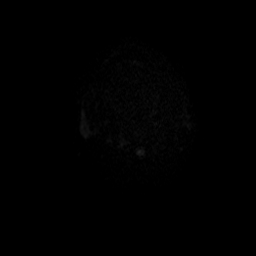
[im 11/62]
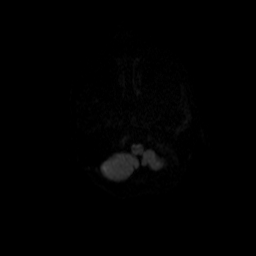
[im 21/62]
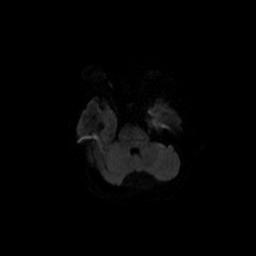
[im 31/62]
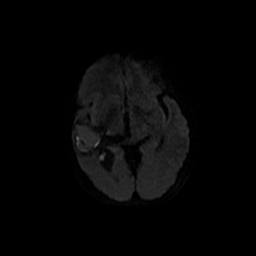
[im 41/62]
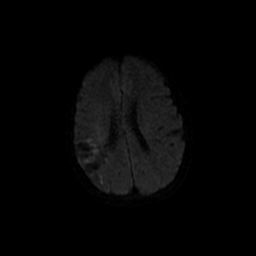
[im 51/62]
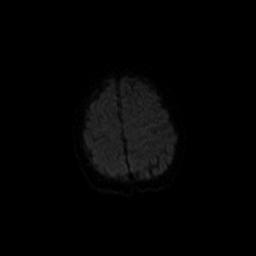
[im 62/62]
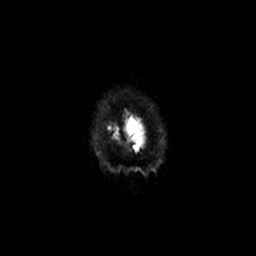

[Series 5: DWI · coronal · 5.0mm · 1.09mm/px · 8 of 68 slices shown (2 of 4)]
[im 1/68]
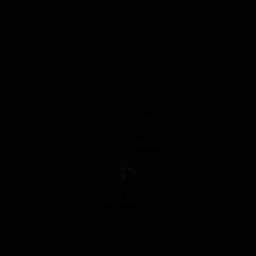
[im 10/68]
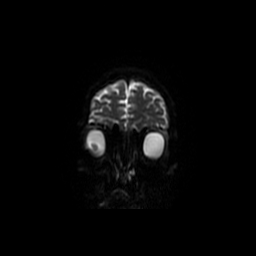
[im 20/68]
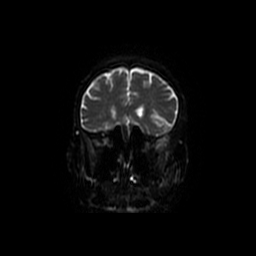
[im 29/68]
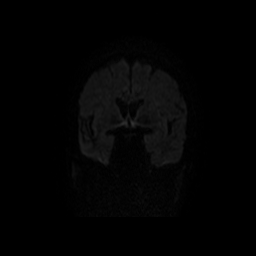
[im 39/68]
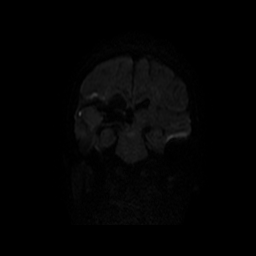
[im 48/68]
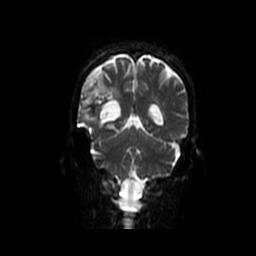
[im 58/68]
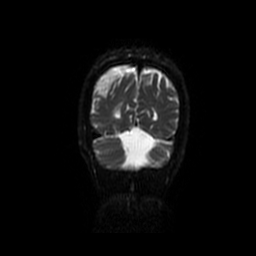
[im 68/68]
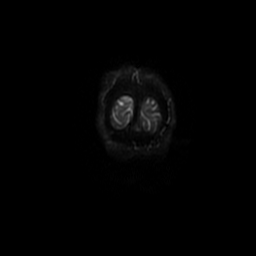

[Series 6: T2 · axial · 5.0mm · 0.43mm/px · z∈[-73,+68]mm · 3 of 22 slices shown]
[im 1/22]
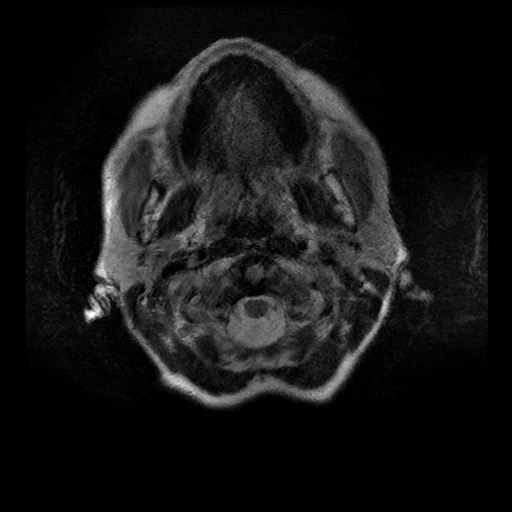
[im 11/22]
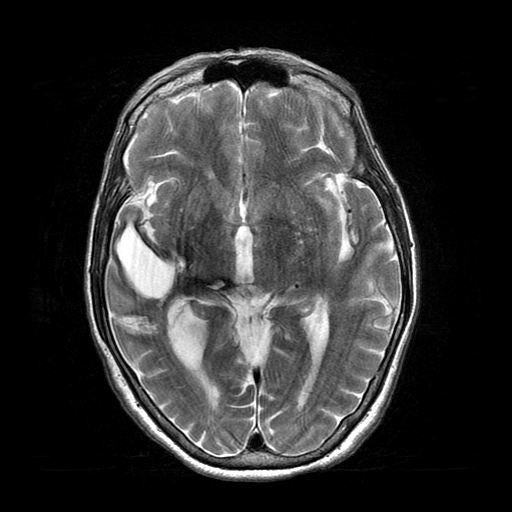
[im 22/22]
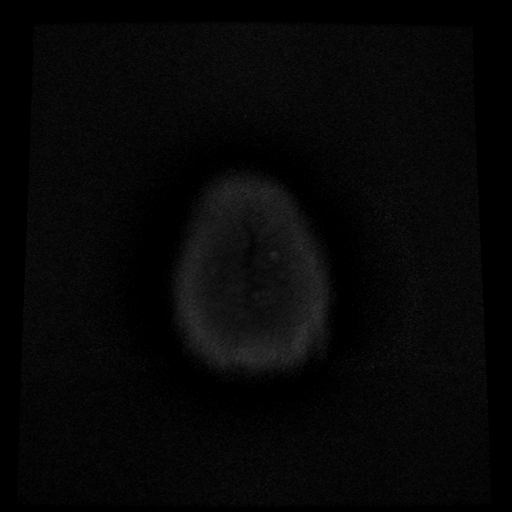

[Series 7: FLAIR · axial · 3.0mm · 0.43mm/px · z∈[-74,+70]mm · 3 of 26 slices shown]
[im 1/26]
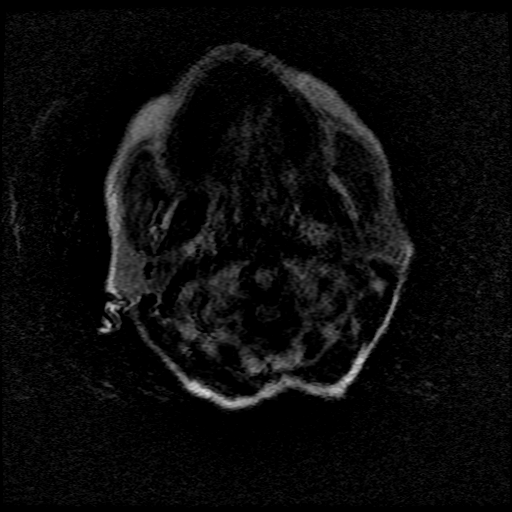
[im 13/26]
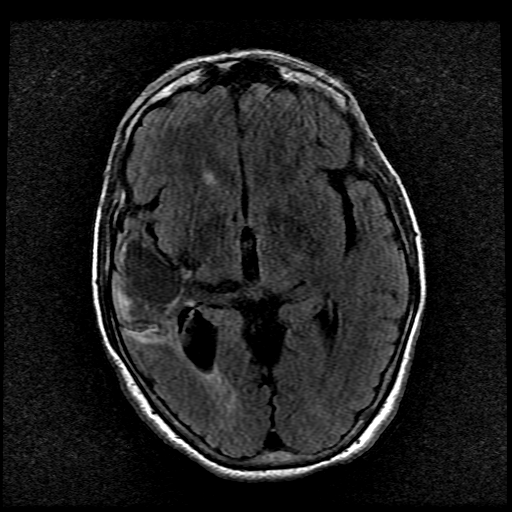
[im 26/26]
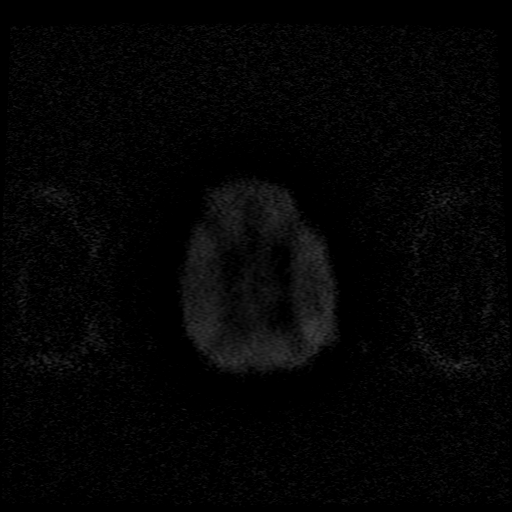

[Series 8: ax mpgr · axial · 5.0mm · 0.43mm/px · z∈[-77,+71]mm · 3 of 23 slices shown]
[im 1/23]
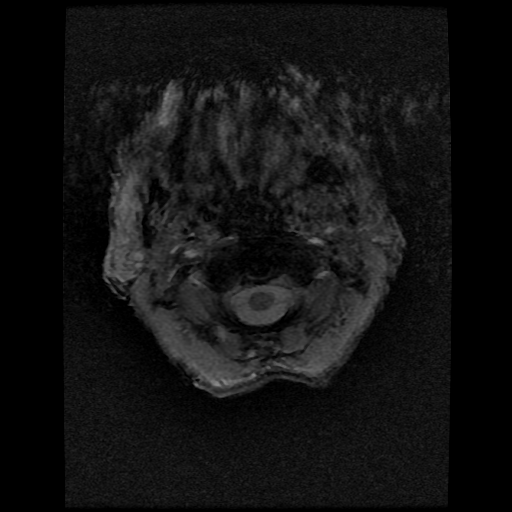
[im 12/23]
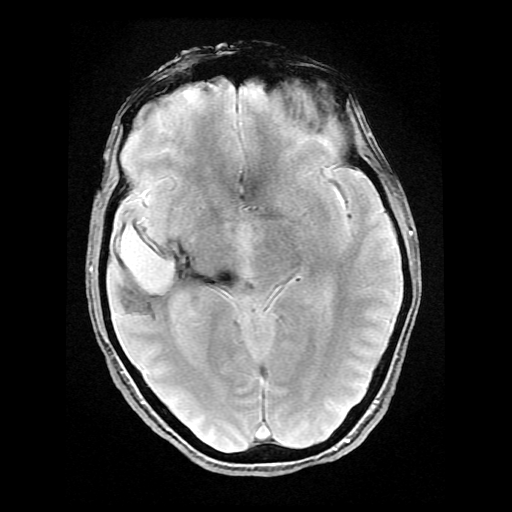
[im 23/23]
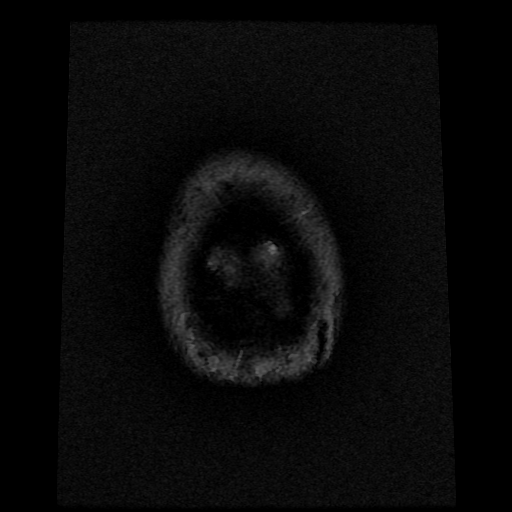

[Series 400: DWI · axial · 5.0mm · 1.09mm/px · z∈[-73,+70]mm · 4 of 31 slices shown (3 of 4)]
[im 1/31]
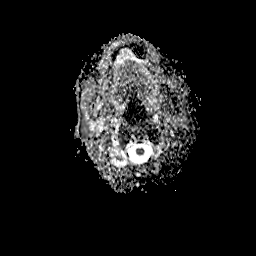
[im 11/31]
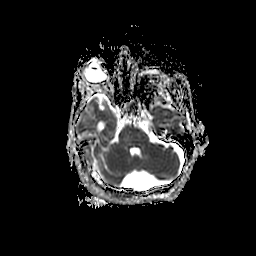
[im 21/31]
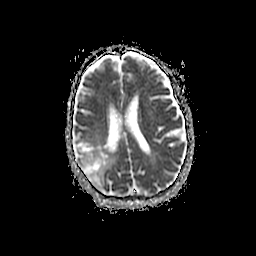
[im 31/31]
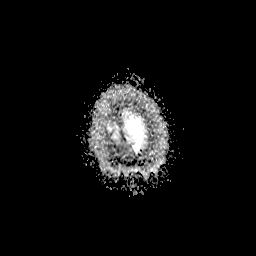

[Series 500: DWI · coronal · 5.0mm · 1.09mm/px · 4 of 34 slices shown (4 of 4)]
[im 1/34]
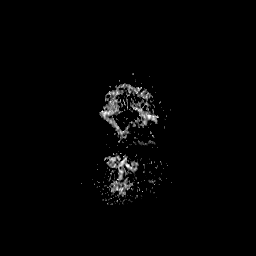
[im 12/34]
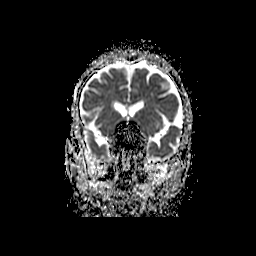
[im 23/34]
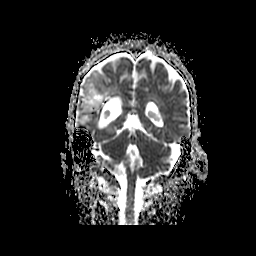
[im 34/34]
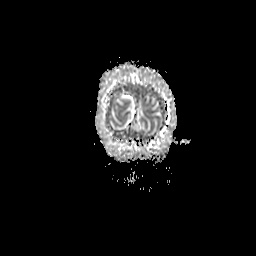

[35 of 48 positions shown; findings below may reference images not displayed]

FINDINGS: Motion limited study.

Brain: Prior right frontotemporal hematoma with developing
encephalomalacia/gliosis and remote blood products in this region.
Small surrounding and areas of DWI hyperintensity are probably
artifactual related to hemosiderin. No convincing acute infarct, no
midline shift, no acute hemorrhage, hydrocephalus, or extra-axial
fluid collection. Similar mildly prominent retro cerebellar CSF,
probably mega cisterna magna.

Vascular: Major arterial flow voids are maintained at the skull
base.

Skull and upper cervical spine: Normal marrow signal.

Sinuses/Orbits: Clear sinuses.  No acute orbital findings.

Other: No mastoid effusions.
IMPRESSION: Sequela of prior right frontal parietal hematoma. Small surrounding
and areas of DWI hyperintensity are probably artifactual related to
hemosiderin. No convincing acute infarct.

## 2021-10-04 MED ORDER — DEXAMETHASONE SODIUM PHOSPHATE 10 MG/ML IJ SOLN
10.0000 mg | Freq: Once | INTRAMUSCULAR | Status: AC
  Administered 2021-10-04: 10 mg via INTRAVENOUS
  Filled 2021-10-04: qty 1

## 2021-10-04 MED ORDER — LACTATED RINGERS IV BOLUS
1000.0000 mL | Freq: Once | INTRAVENOUS | Status: AC
Start: 2021-10-04 — End: 2021-10-04
  Administered 2021-10-04: 1000 mL via INTRAVENOUS

## 2021-10-04 MED ORDER — PROCHLORPERAZINE EDISYLATE 10 MG/2ML IJ SOLN
10.0000 mg | Freq: Once | INTRAMUSCULAR | Status: AC
Start: 2021-10-04 — End: 2021-10-04
  Administered 2021-10-04: 10 mg via INTRAVENOUS
  Filled 2021-10-04: qty 2

## 2021-10-04 MED ORDER — SODIUM CHLORIDE 0.9% FLUSH: 3.0000 mL | Freq: Once | INTRAVENOUS | Status: DC

## 2021-10-04 MED ORDER — LORAZEPAM 2 MG/ML IJ SOLN
1.0000 mg | Freq: Once | INTRAMUSCULAR | Status: AC
Start: 2021-10-04 — End: 2021-10-04
  Administered 2021-10-04: 1 mg via INTRAVENOUS
  Filled 2021-10-04: qty 1

## 2021-10-04 MED ORDER — ONDANSETRON HCL 4 MG/2ML IJ SOLN
4.0000 mg | Freq: Once | INTRAMUSCULAR | Status: AC
  Administered 2021-10-04: 4 mg via INTRAVENOUS
  Filled 2021-10-04: qty 2

## 2021-10-04 NOTE — Discharge Instructions (Signed)
Return for any problem.  ?

## 2021-10-04 NOTE — ED Notes (Signed)
Patient transported to MRI 

## 2021-10-04 NOTE — ED Notes (Signed)
Pt urinated all over the floor in room. New sheet and room clean. Pt is axo4.  ?

## 2021-10-04 NOTE — ED Triage Notes (Addendum)
Pt c/o migraine headache x 2 days and left arm heaviness since 1700 last night; pt has hx of stroke and cluster migraines; reports RX meds have been ineffective; pt reports left sided deficits from prior stroke but this feels different  ?

## 2021-10-04 NOTE — ED Notes (Signed)
Pt is still in MRI at this time.  

## 2021-10-04 NOTE — ED Provider Notes (Signed)
?[facility] EMERGENCY DEPARTMENT ?Provider Note ? ? ?CSN: [id number] ?Arrival date & time: 10/04/21  0518 ? ?  ? ?History ? ?Chief Complaint  ?Patient presents with  ? Migraine  ? ? ?Monica Miranda is a 61 y.o. female. ? ?The history is provided by the patient.  ?Migraine ?She has history of stroke, and COPD, migraine headaches and comes in because of a migraine headache for the last 2 days.  Headache is bifrontal and throbbing and worse with exposure to light.  She denies nausea or vomiting.  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  She denies fever or chills.  She has not taken anything for her headache. ?  ?Home Medications ?Prior to Admission medications   ?Medication Sig Start Date End Date Taking? Authorizing Provider  ?albuterol (VENTOLIN HFA) 108 (90 Base) MCG/ACT inhaler Inhale 1-2 puffs into the lungs every 6 (six) hours as needed for wheezing or shortness of breath. 09/16/21   Raulkar, Drema Pry, MD  ?baclofen (LIORESAL) 10 MG tablet Take 1 tablet (10 mg total) by mouth 3 (three) times daily as needed for muscle spasms. 09/16/21   Raulkar, Drema Pry, MD  ?butalbital-acetaminophen-caffeine (FIORICET) [phone] MG tablet Take 1 tablet by mouth every 6 (six) hours as needed for headache. 09/21/21   Raulkar, Drema Pry, MD  ?cyanocobalamin 1000 MCG tablet Take 1 tablet (1,000 mcg total) by mouth daily. 09/16/21   Raulkar, Drema Pry, MD  ?diphenhydrAMINE (BENADRYL) 25 mg capsule Take 1 capsule (25 mg total) by mouth every 6 (six) hours as needed. 09/16/21   Raulkar, Drema Pry, MD  ?folic acid (FOLVITE) 1 MG tablet Take 1 tablet (1 mg total) by mouth daily. 09/16/21   Raulkar, Drema Pry, MD  ?melatonin 3 MG TABS tablet Take 1 tablet (3 mg total) by mouth at bedtime as needed. 09/16/21   Raulkar, Drema Pry, MD  ?Multiple Vitamin (MULTIVITAMIN WITH MINERALS) TABS tablet Take 1 tablet by  mouth daily. 08/03/21   Raulkar, Drema Pry, MD  ?nicotine (NICODERM CQ - DOSED IN MG/24 HOURS) 21 mg/24hr patch 21 mg patch daily x1 week then 14 mg patch daily x3 weeks then 7 mg patch daily x3 weeks and stop 09/16/21   Raulkar, Drema Pry, MD  ?ondansetron (ZOFRAN) 4 MG tablet Take 1 tablet (4 mg total) by mouth every 8 (eight) hours as needed for nausea or vomiting. 09/03/21   Raulkar, Drema Pry, MD  ?pantoprazole (PROTONIX) 40 MG tablet Take 1 tablet (40 mg total) by mouth at bedtime. 09/16/21   Raulkar, Drema Pry, MD  ?polyethylene glycol (MIRALAX / GLYCOLAX) 17 g packet Take 17 g by mouth daily. 09/16/21   Raulkar, Drema Pry, MD  ?topiramate (TOPAMAX) 100 MG tablet Take 1 tablet (100 mg total) by mouth 2 (two) times daily. 09/16/21   Raulkar, Drema Pry, MD  ?traMADol (ULTRAM) 50 MG tablet Take 1 tablet (50 mg total) by mouth every 8 (eight) hours as needed for severe pain. 06/16/21   Angiulli, Mcarthur Rossetti, PA-C  ?Vitamin D, Ergocalciferol, (DRISDOL) 1.25 MG (50000 UNIT) CAPS capsule Take 1 capsule (50,000 Units total) by mouth every 7 (seven) days. 09/16/21   Raulkar, Drema Pry, MD  ?   ? ?Allergies    ?Codeine   ? ?Review of Systems   ?Review of Systems  ?All other systems reviewed and are negative. ? ?Physical Exam ?  Updated Vital Signs ?BP 123/88   Pulse 75   Temp 97.9 ?F (36.6 ?C) (Oral)   Resp 12   Ht 5\' 2"  (1.575 m)   Wt 43.1 kg   SpO2 98%   BMI 17.38 kg/m?  ?Physical Exam ?Vitals and nursing note reviewed.  ?61 year old female, resting comfortably and in no acute distress. Vital signs are normal. Oxygen saturation is 98%, which is normal. ?Head is normocephalic and atraumatic. PERRLA, EOMI. Oropharynx is clear.  Fundi show no hemorrhage, exudate, papilledema. ?Neck is nontender and supple without adenopathy or JVD. ?Back is nontender and there is no CVA tenderness. ?Lungs are clear without rales, wheezes, or rhonchi. ?Chest is nontender. ?Heart has regular rate and rhythm without murmur. ?Abdomen is soft,  flat, nontender without masses or hepatosplenomegaly and peristalsis is normoactive. ?Extremities have no cyanosis or edema, full range of motion is present. ?Skin is warm and dry without rash. ?Neurologic: Awake and alert.  Oriented x3.  Speech is normal.  Cranial nerves are intact.  Moderately severe left hemiparesis is present with strength in left arm 2/5 and strength in left leg 3/5.  There is decreased pinprick sensation in the left arm and left leg.  There is extinction on the left on double simultaneous stimulation.  Parts of the motor exam are suspected because of poor cooperation.  When I isolate elbow extension and elbow flexion, strength is actually 3-/5 but she will not raise her arm at all when attempting pronator drift testing. ? ?ED Results / Procedures / Treatments   ?Labs ?(all labs ordered are listed, but only abnormal results are displayed) ?Labs Reviewed  ?CBC - Abnormal; Notable for the following components:  ?    Result Value  ? RDW 16.2 (*)   ? All other components within normal limits  ?COMPREHENSIVE METABOLIC PANEL - Abnormal; Notable for the following components:  ? Chloride 112 (*)   ? Glucose, Bld 111 (*)   ? Total Protein 5.9 (*)   ? Albumin 3.4 (*)   ? All other components within normal limits  ?CBG MONITORING, ED - Abnormal; Notable for the following components:  ? Glucose-Capillary 101 (*)   ? All other components within normal limits  ?I-STAT CHEM 8, ED - Abnormal; Notable for the following components:  ? Creatinine, Ser 0.40 (*)   ? Glucose, Bld 106 (*)   ? All other components within normal limits  ?PROTIME-INR  ?APTT  ?DIFFERENTIAL  ?CBG MONITORING, ED  ? ? ?EKG ?None ? ?Radiology ?CT Head Wo Contrast ? ?Result Date: 10/04/2021 ?CLINICAL DATA:  Stroke follow-up. Migraine headache in left arm heaviness EXAM: CT HEAD WITHOUT CONTRAST TECHNIQUE: Contiguous axial images were obtained from the base of the skull through the vertex without intravenous contrast. RADIATION DOSE REDUCTION:  This exam was performed according to the departmental dose-optimization program which includes automated exposure control, adjustment of the mA and/or kV according to patient size and/or use of iterative reconstruction technique. COMPARISON:  06/12/2021 FINDINGS: Brain: Encephalomalacia in the right parietal and temporal lobes with resolved swelling and high-density clot. No acute hemorrhage, hydrocephalus, or visible gray matter infarction. Mega cisterna magna. Vascular: No hyperdense vessel or unexpected calcification. Skull: Normal. Negative for fracture or focal lesion. Sinuses/Orbits: No acute finding. IMPRESSION: 1. No acute finding. 2. 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 Electronically Signed   By: 08/10/2021 M.D.   On: 10/04/2021 06:10   ? ?Procedures ?Procedures  ?Cardiac monitor shows normal sinus rhythm, per  my interpretation. ? ?Medications Ordered in ED ?Medications  ?sodium chloride flush (NS) 0.9 % injection 3 mL (has no administration in time range)  ?lactated ringers bolus 1,000 mL (has no administration in time range)  ?prochlorperazine (COMPAZINE) injection 10 mg (has no administration in time range)  ?dexamethasone (DECADRON) injection 10 mg (has no administration in time range)  ? ? ?ED Course/ Medical Decision Making/ A&P ?  ?                        ?Medical Decision Making ?Amount and/or Complexity of Data Reviewed ?Labs: ordered. ?Radiology: ordered. ? ?Risk ?Prescription drug management. ? ? ?Headache and left hemiparesis and patient with prior stroke with left hemiparesis.  I have reviewed her old records, and discharge summaries from hospitalization in December mention rather dense left hemiparesis.  Code stroke had been activated.  CT shows evidence of prior infarct but no acute finding.  I have independently viewed the images, and agree with the radiologist's interpretation.  CBC and metabolic panel are unremarkable as are coagulation studies.  She will be  given a migraine cocktail of lactated Ringer's solution, prochlorperazine, dexamethasone.  She will also be sent for MRI to look for evidence of new stroke.  Case is signed out to Dr. Rodena MedinMessick. ? ?CRITICAL CARE ?Perf

## 2021-10-04 NOTE — ED Provider Notes (Signed)
Seen after prior EDP. ? ?MRI is negative for acute infarct or other acute process. ? ?Patient is comfortable at time of discharge.  She does understand need for close outpatient follow-up.  Strict return precautions given understood. ?  ?Valarie Merino, MD ?10/04/21 1450 ? ?

## 2021-10-04 NOTE — ED Notes (Signed)
Pt vomited on herself. RN gave pt Zofran. New gown, sheet, and blanket applied. Pt is resting comfortably at this time.  ?

## 2021-10-21 ENCOUNTER — Telehealth: Payer: Self-pay | Admitting: *Deleted

## 2021-10-21 NOTE — Telephone Encounter (Signed)
Monica Miranda called to report that her migraine headaches are still present and medication given to her is not working. Please advise.

## 2021-10-22 ENCOUNTER — Encounter
Payer: Medicaid Other | Attending: Physical Medicine and Rehabilitation | Admitting: Physical Medicine and Rehabilitation

## 2021-10-22 DIAGNOSIS — I610 Nontraumatic intracerebral hemorrhage in hemisphere, subcortical: Secondary | ICD-10-CM | POA: Diagnosis not present

## 2021-10-22 DIAGNOSIS — G43711 Chronic migraine without aura, intractable, with status migrainosus: Secondary | ICD-10-CM | POA: Insufficient documentation

## 2021-10-22 MED ORDER — UBRELVY 50 MG PO TABS: 1.0000 | ORAL_TABLET | Freq: Two times a day (BID) | ORAL | 3 refills | Status: DC | PRN

## 2021-10-22 NOTE — Progress Notes (Signed)
Subjective:    Patient ID: Monica Miranda, female    DOB: [birth year], 61 y.o.   MRN: [id number]  HPI An audio/video tele-health visit is felt to be the most appropriate encounter for this patient at this time. This is a follow up tele-visit via phone. The patient is at home. MD is at office. Prior to scheduling this appointment, our staff discussed the limitations of evaluation and management by telemedicine and the availability of in-person appointments. The patient expressed understanding and agreed to proceed.   Monica Miranda is a 61 year old woman who presents for follow-up of migraines and hemorrhagic stroke.   1) Hemorrhagic stroke -she asks about repeat Head CT in September -she has been doing well at home -she has not yet returned to jogging but she hopes to -she has been walking on a nature trail by her son's home  2) Left ankle pain: -she is waking up at night with pain due to the [facility] brace, her daughter-in-law adjusted the brace and she feels this caused her to have pain -she brings in the [facility] boot today.  -she wants to wear the [facility] so her foot does not become inverted.   3) Left upper extremity weakness -strength has improved  4) Muscle spasms -more frequent -she prefers the baclofen over the tramadol. It does make her a little sleepy  5) Migraines: -she is not having as frequent migraines as before, but when she gets them, often the Topamax and Fioricet are not enough to break them -she asks if there is anything else we can give her to help with her migraines -she has been having a lot of family stressors recently and she feels these have worsened the migraines   Pain Inventory Average Pain 6 Pain Right Now 0 My pain is  throbbing  LOCATION OF PAIN  foot  BOWEL Number of stools per week: 7 Oral laxative use No  Type of laxative . Enema or suppository use No  History of colostomy No  Incontinent No   BLADDER Normal In and out cath, frequency . Able  to self cath  na Bladder incontinence No  Frequent urination No  Leakage with coughing No  Difficulty starting stream No  Incomplete bladder emptying No    Mobility walk without assistance how many minutes can you walk? 20-30 ability to climb steps?  yes do you drive?  no  Function disabled: date disabled . I need assistance with the following:  household duties  Neuro/Psych weakness numbness tingling spasms  Prior Studies TC appt  Physicians involved in your care TC appt   No family history on file. 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; marijuana daily   Sexual activity: Not on file  Other Topics  Concern   Not on file  Social History Narrative   Not on file   Social Determinants of Health   Financial Resource Strain: Not on file  Food Insecurity: Not on file  Transportation Needs: Not on file  Physical Activity: Not on file  Stress: Not on file  Social Connections: Not on file   Past Surgical History:  Procedure Laterality Date   ESOPHAGEAL DILATION     Past Medical History:  Diagnosis Date   COPD (chronic obstructive pulmonary disease) (HCC)    Rib fracture    4 months ago   Stroke [facility])    There were no vitals taken for this visit.  Opioid Risk Score:   Fall Risk Score:  `1  Depression screen [facility] 2/9     06/28/2021   10:36 AM  Depression screen PHQ 2/9   Decreased Interest 0  Down, Depressed, Hopeless 0  PHQ - 2 Score 0  Altered sleeping 0  Tired, decreased energy 0  Change in appetite 0  Feeling bad or failure about yourself  0  Trouble concentrating 0  Moving slowly or fidgety/restless 0  Suicidal thoughts 0  PHQ-9 Score 0     Review of Systems  Musculoskeletal:  Positive for gait problem.       Spasms  Neurological:  Positive for weakness.  All other systems reviewed and are negative.     Objective:   Physical Exam Not performed       Assessment & Plan:  1) muscle spasms -discussed that this could be a sign of motor recovery.  -continue baclofen prn. -can stop tramadol -continue bacopa.  -Provided with a pain relief journal and discussed that it contains foods and lifestyle tips to naturally help to improve pain. Discussed that these lifestyle strategies are also very good for health unlike some medications which can have negative side effects. Discussed that the act of keeping a journal can be therapeutic and helpful to realize patterns what helps to trigger and alleviate pain.    2) Left lower extremity weakness: -adjusted her PRAFO  3) SAH: -continue therapies -provided dietary and exercise counseling -discussed that hypertension is number one reversible risk factor for stroke.   -recommended only repeating Head CT if needed, we expect blood to resorb -we can repeat CT scan in September to see if swelling has resolved. Discussed with her again today  4) Migraines -prescribed [name], discussed that she can use this to break migraine, and if migraine persists after 2 hours, she can take another dose. Discussed that she should not take more than 2 doses in 24 hours -discussed that [name] has a safer side effect profile than the Triptans -currently Topamax and Fioricet are not enough to break her migraines  11 minutes spent in discussion of her migraines, discussed trying Ubrelvy 50mg , discussed that she can  use this to break migraine, and if migraine persists after 2 hours, she can take another dose, discussed that she should not take more than 2 doses in 24 hours, discussed that has a safer side effect profile than the Triptans, commended her on her nature walks, confirmed repeat CT scan in September

## 2021-10-25 ENCOUNTER — Telehealth: Payer: Self-pay

## 2021-10-25 NOTE — Telephone Encounter (Signed)
PA for Ubrelvy sent to insurance through CoverMyMeds ?

## 2021-10-25 NOTE — Telephone Encounter (Signed)
Approved. Approved for UBRELVY Tablet 50MG , quantity up to 16 tablets per 8 days, under the pharmacy benefit. The drug has been approved from 10/25/2021 to 10/25/2022. Generic or biosimilar substitution may be required when available and preferred on the formulary

## 2021-10-29 ENCOUNTER — Other Ambulatory Visit: Payer: Self-pay | Admitting: Physical Medicine and Rehabilitation

## 2021-10-29 MED ORDER — BUTALBITAL-APAP-CAFFEINE 50-325-40 MG PO TABS: 1.0000 | ORAL_TABLET | Freq: Four times a day (QID) | ORAL | 0 refills | Status: DC | PRN

## 2021-10-29 NOTE — Telephone Encounter (Signed)
Monica Miranda called back and they are having issues with the Ubrelvy at [facility]- probably needs a prior auth. Being that it is Friday on a holiday weekend,  do you want to send her in some of the  fiorcet Rx to the [facility] at [facility] (because she is at her son's house and cannot get to the one at [facility].

## 2021-11-09 ENCOUNTER — Telehealth: Payer: Self-pay | Admitting: *Deleted

## 2021-11-09 NOTE — Telephone Encounter (Signed)
Monica Miranda called x3 wanting to know what cold medicine she can take for cold symptoms. I advised her to call her PCP.

## 2021-11-23 ENCOUNTER — Encounter: Payer: Self-pay | Admitting: Physical Medicine and Rehabilitation

## 2021-11-23 ENCOUNTER — Encounter
Payer: Medicaid Other | Attending: Physical Medicine and Rehabilitation | Admitting: Physical Medicine and Rehabilitation

## 2021-11-23 VITALS — BP 113/76 | HR 74 | Ht 62.0 in | Wt 99.2 lb

## 2021-11-23 DIAGNOSIS — M7989 Other specified soft tissue disorders: Secondary | ICD-10-CM | POA: Diagnosis present

## 2021-11-23 DIAGNOSIS — M79605 Pain in left leg: Secondary | ICD-10-CM | POA: Diagnosis present

## 2021-11-23 DIAGNOSIS — G43909 Migraine, unspecified, not intractable, without status migrainosus: Secondary | ICD-10-CM | POA: Insufficient documentation

## 2021-11-23 DIAGNOSIS — M62838 Other muscle spasm: Secondary | ICD-10-CM | POA: Insufficient documentation

## 2021-11-23 MED ORDER — NALTREXONE HCL 50 MG PO TABS: 25.0000 mg | ORAL_TABLET | ORAL | 3 refills | Status: DC

## 2021-11-23 MED ORDER — VITAMIN D (ERGOCALCIFEROL) 1.25 MG (50000 UNIT) PO CAPS: 50000.0000 [IU] | ORAL_CAPSULE | ORAL | 0 refills | Status: DC

## 2021-11-23 NOTE — Patient Instructions (Signed)
Bioptemizer's Magnesium Breakthrough ?

## 2021-11-23 NOTE — Progress Notes (Signed)
Subjective:    Patient ID: Monica Miranda, female    DOB: Nov 29, 1960, 61 y.o.   MRN: [id number]  HPI  Monica Miranda is a 61 year old woman who presents for follow-up of migraines and hemorrhagic stroke.   1) Hemorrhagic stroke -she asks about repeat Head CT in September -she has been doing well at home -she has not yet returned to jogging but she hopes to -she has been walking on a nature trail by her son's home  2) Left ankle pain: -she is waking up at night with pain due to the [facility] brace, her daughter-in-law adjusted the brace and she feels this caused her to have pain -she brings in the [facility] boot today.  -she wants to wear the [facility] so her foot does not become inverted.  -her cast is digging into the tibia.  -having swelling over the the medical aspct of the ankle.  -still wearing PRAFo at night and asks for how long she should use it for.   3) Left upper extremity weakness -strength has improved  4) Muscle spasms -more frequent -she prefers the baclofen over the tramadol. It does make her a little sleepy -does not use baclofen  5) Migraines: -she is not having as frequent migraines as before, but when she gets them, often the Topamax and Fioricet are not enough to break them -she asks if there is anything else we can give her to help with her migraines -she has been having a lot of family stressors recently and she feels these have worsened the migraines -topamax is not helping with migraines. -Fioricet she cures in half twice -she would rather do the NAC -she does not want to try 100% oxygen.    Pain Inventory Average Pain 7 Pain Right Now 3 My pain is intermittent, tingling, and aching  LOCATION OF PAIN  foot, leg, ankle, toes  BOWEL Number of stools per week: 3 Oral laxative use No  Type of laxative . Enema or suppository use No  History of colostomy No  Incontinent No   BLADDER Normal In and out cath, frequency . Able to self cath  na Bladder  incontinence No  Frequent urination No  Leakage with coughing No  Difficulty starting stream No  Incomplete bladder emptying No    Mobility walk without assistance how many minutes can you walk? 20-30 ability to climb steps?  yes do you drive?  no  Function disabled: date disabled . I need assistance with the following:  dressing, bathing, household duties, and shopping  Neuro/Psych weakness numbness tingling spasms  Prior Studies Any changes since last visit?  no  Physicians involved in your care Any changes since last visit?  no   No family history on file. 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; marijuana daily   Sexual activity: Not on file  Other Topics Concern   Not on file  Social History Narrative   Not on file   Social Determinants of Health   Financial Resource Strain: Not on file  Food Insecurity: Not on file  Transportation Needs: Not on file  Physical Activity: Not on file  Stress: Not on file  Social Connections: Not on file   Past Surgical History:  Procedure Laterality Date   ESOPHAGEAL DILATION     Past Medical History:  Diagnosis Date   COPD (chronic obstructive pulmonary disease) ([place])    Rib fracture    4 months ago   Stroke ([place])    There were no vitals taken for this visit.  Opioid Risk Score:   Fall Risk Score:  `1  Depression  screen [facility] 2/9     06/28/2021   10:36 AM  Depression screen PHQ 2/9  Decreased Interest 0  Down, Depressed, Hopeless 0  PHQ - 2 Score 0  Altered sleeping 0  Tired, decreased energy 0  Change in appetite 0  Feeling bad or failure about yourself  0  Trouble concentrating 0  Moving slowly or fidgety/restless 0  Suicidal thoughts 0  PHQ-9 Score 0     Review of Systems  Musculoskeletal:  Positive for gait problem.       Spasms  Neurological:  Positive for weakness.  All other systems reviewed and are negative.     Objective:   Physical Exam Gen: no distress, normal appearing HEENT: oral mucosa pink and moist, NCAT Cardio: Reg rate Chest: normal effort, normal rate of breathing Abd: soft, non-distended Ext: no edema Psych: pleasant, normal affect Skin: intact Neuro: eye sensitivity in left eye    Assessment & Plan:  1) Muscle spasms -discussed that this could be a sign of motor recovery.  -continue baclofen prn. -can stop tramadol -continue  bacopa.  -Provided with a pain relief journal and discussed that it contains foods and lifestyle tips to naturally help to improve pain. Discussed that these lifestyle strategies are also very good for health unlike some medications which can have negative side effects. Discussed that the act of keeping a journal can be therapeutic and helpful to realize patterns what helps to trigger and alleviate pain.   -Provided with a pain relief journal and discussed that it contains foods and lifestyle tips to naturally help to improve pain. Discussed that these lifestyle strategies are also very good for health unlike some medications which can have negative side effects. Discussed that the act of keeping a journal can be therapeutic and helpful to realize patterns what helps to trigger and alleviate pain.    2) Left lower extremity weakness: -adjusted her PRAFO  3) SAH: -continue therapies -provided dietary and exercise counseling -discussed  that hypertension is number one reversible risk factor for stroke.   -recommended only repeating Head CT if needed, we expect blood to resorb -we can repeat CT scan in September to see if swelling has resolved. Discussed with her again today  4) Migraines -prescribed Roselyn Meier, discussed that she can use this to break migraine, and if migraine persists after 2 hours, she can take another dose. Discussed that she should not take more than 2 doses in 24 hours. Not covered.  -discussed that Roselyn Meier has a safer side effect profile than the Triptans -currently Topamax and Fioricet are not enough to break her migraines -recommended Magnesium breakthrough  6) Smoking cessation: prescribed Naltrexone.   7) Left lower extremity swelling: -vascular ultrasound ordered

## 2021-12-13 ENCOUNTER — Other Ambulatory Visit: Payer: Self-pay

## 2021-12-13 MED ORDER — BUTALBITAL-APAP-CAFFEINE 50-325-40 MG PO TABS
1.0000 | ORAL_TABLET | Freq: Four times a day (QID) | ORAL | 0 refills | Status: DC | PRN
Start: 2021-12-13 — End: 2021-12-28

## 2021-12-13 MED ORDER — PANTOPRAZOLE SODIUM 40 MG PO TBEC: 40.0000 mg | DELAYED_RELEASE_TABLET | Freq: Every day | ORAL | 0 refills | Status: DC

## 2021-12-13 MED ORDER — FOLIC ACID 1 MG PO TABS
1.0000 mg | ORAL_TABLET | Freq: Every day | ORAL | 0 refills | Status: DC
Start: 2021-12-13 — End: 2022-04-13

## 2021-12-13 NOTE — Telephone Encounter (Signed)
Patient aware Dr. Carlis Abbott will not be back until Wednesday.

## 2021-12-13 NOTE — Telephone Encounter (Signed)
Patient also has questions about a new brace. Per patient it was discussed during the last office visit. Please call her back on [phone].

## 2021-12-17 ENCOUNTER — Ambulatory Visit (HOSPITAL_COMMUNITY): Payer: Medicaid Other

## 2021-12-20 ENCOUNTER — Encounter
Payer: Medicaid Other | Attending: Physical Medicine and Rehabilitation | Admitting: Physical Medicine and Rehabilitation

## 2021-12-20 ENCOUNTER — Ambulatory Visit (HOSPITAL_COMMUNITY)
Admission: RE | Admit: 2021-12-20 | Discharge: 2021-12-20 | Disposition: A | Discharge status: Home / Self Care | Payer: Medicaid Other | Source: Ambulatory Visit | Attending: Physical Medicine and Rehabilitation | Admitting: Physical Medicine and Rehabilitation

## 2021-12-20 DIAGNOSIS — R2242 Localized swelling, mass and lump, left lower limb: Secondary | ICD-10-CM | POA: Diagnosis not present

## 2021-12-20 DIAGNOSIS — M79605 Pain in left leg: Secondary | ICD-10-CM | POA: Diagnosis not present

## 2021-12-20 DIAGNOSIS — M7989 Other specified soft tissue disorders: Secondary | ICD-10-CM | POA: Insufficient documentation

## 2021-12-20 DIAGNOSIS — M25569 Pain in unspecified knee: Secondary | ICD-10-CM | POA: Diagnosis not present

## 2021-12-20 NOTE — Progress Notes (Signed)
VASCULAR LAB    Left lower extremity venous duplex has been performed.  See CV proc for preliminary results.   Brahm Barbeau, RVT 12/20/2021, 2:38 PM

## 2021-12-21 NOTE — Progress Notes (Signed)
Subjective:    Patient ID: Monica Miranda, female    DOB: [birth year], 61 y.o.   MRN: [id number]  HPI An audio/video tele-health visit is felt to be the most appropriate encounter for this patient at this time. This is a follow up tele-visit via phone. The patient is at home. MD is at office. Prior to scheduling this appointment, our staff discussed the limitations of evaluation and management by telemedicine and the availability of in-person appointments. The patient expressed understanding and agreed to proceed.   Monica Miranda is a 61 year old woman who presents for f/u of migraines and hemorrhagic stroke.   1) Hemorrhagic stroke -she asks about repeat Head CT in September -she has been doing well at home -she has not yet returned to jogging but she hopes to -she has been walking [place] a nature trail by her son's home  2) Left ankle pain: -she is waking up at night with pain due to the [facility] brace, her daughter-in-law adjusted the brace and she feels this caused her to have pain -she brings in the [facility] boot today.  -she wants to wear the [facility] so her foot does not become inverted.  -her cast is digging into the tibia.  -having swelling over the the medical aspct of the ankle.  -still wearing PRAFo at night and asks for how long she should use it for. -she has an appointment with [facility] for new AFO   3) Left upper extremity weakness -strength has improved  4) Muscle spasms -more frequent -she prefers the baclofen over the tramadol. It does make her a little sleepy -does not use baclofen  5) Migraines: -she is not having as frequent migraines as before, but when she gets them, often the Topamax and Fioricet are not enough to break them -she usually breaks the Fioricet into 1/4 tablet as she does not like to take medications for her pain -she also takes NAC for her migraines and this makes her sleepy -she asks if there is anything else we can give her to help with her migraines -she  has been having a lot of family stressors recently and she feels these have worsened the migraines -topamax is not helping with migraines so she stopped this.  -Fioricet she cures in half twice -she would rather do the NAC -she does not want to try 100% oxygen.    Pain Inventory Average Pain 7 Pain Right Now 3 My pain is intermittent, tingling, and aching  LOCATION OF PAIN  foot, leg, ankle, toes  BOWEL Number of stools per week: 3 Oral laxative use No  Type of laxative . Enema or suppository use No  History of colostomy No  Incontinent No   BLADDER Normal In and out cath, frequency . Able to self cath  na Bladder incontinence No  Frequent urination No  Leakage with coughing No  Difficulty starting stream No  Incomplete bladder emptying No    Mobility walk without assistance how many minutes can you walk? 20-30 ability to climb steps?  yes do you drive?  no  Function disabled: date disabled . I need assistance with the following:  dressing, bathing, household duties, and shopping  Neuro/Psych weakness numbness tingling spasms  Prior Studies Any changes since last visit?  no  Physicians involved in your care Any changes since last visit?  no   No family history on file. 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; marijuana daily   Sexual activity: Not on file   Other Topics Concern   Not on file  Social History Narrative   Not on file   Social Determinants of Health   Financial Resource Strain: Not on file  Food Insecurity: Not on file  Transportation Needs: Not on file  Physical Activity: Not on file  Stress: Not on file  Social Connections: Not on file   Past Surgical History:  Procedure Laterality Date   ESOPHAGEAL DILATION     Past Medical History:  Diagnosis Date   COPD (chronic obstructive pulmonary disease) ([place])    Rib fracture    4 months ago   Stroke ([place])    There were no vitals taken for this visit.  Opioid Risk Score:   Fall Risk Score:  `1  Depression screen [facility] 2/9     11/23/2021   10:02 AM 06/28/2021   10:36 AM  Depression screen PHQ 2/9  Decreased Interest 0 0  Down, Depressed, Hopeless 0 0  PHQ - 2 Score 0 0  Altered sleeping  0  Tired, decreased energy  0  Change in appetite  0  Feeling bad or failure about yourself   0  Trouble concentrating  0  Moving slowly or fidgety/restless  0  Suicidal thoughts  0  PHQ-9 Score  0     Review of Systems  Musculoskeletal:  Positive for gait problem.       Spasms  Neurological:  Positive for weakness.  All other systems reviewed and are negative.      Objective:   Physical Exam Not performed Assessment & Plan:  1) Muscle spasms -discussed that this could be a sign of motor recovery.  -continue baclofen prn. -can stop tramadol -continue  bacopa.  -Provided with a pain relief journal and discussed that it contains foods and lifestyle tips to naturally help to improve pain. Discussed that these lifestyle strategies are also very good for health unlike some medications which can have negative side effects. Discussed that the act of keeping a journal can be therapeutic and helpful to realize patterns what helps to trigger and alleviate pain.   -Provided with a pain relief journal and discussed that it contains foods and lifestyle tips to naturally help to  improve pain. Discussed that these lifestyle strategies are also very good for health unlike some medications which can have negative side effects. Discussed that the act of keeping a journal can be therapeutic and helpful to realize patterns what helps to trigger and alleviate pain.    2) Left lower extremity weakness: -adjusted her PRAFO  3) SAH: -continue therapies -provided dietary and exercise counseling -discussed that hypertension is number one reversible risk factor for stroke.   -recommended only repeating Head CT if needed, we expect blood  to resorb -we can repeat CT scan in September to see if swelling has resolved. Discussed with her again today  4) Migraines -prescribed [name], discussed that she can use this to break migraine, and if migraine persists after 2 hours, she can take another dose. Discussed that she should not take more than 2 doses in 24 hours. Not covered.  -discussed that [name] has a safer side effect profile than the Triptans given her history of stroke. -currently Topamax and Fioricet are not enough to break her migraines -recommended Magnesium breakthrough -refilled her Fioricet this week -d/c topamax since does not appear to help and she does not want decreased appetite.  -recommended trying use of caffeine to help break migraines  6) Smoking cessation: prescribed Naltrexone.   7) Left lower extremity swelling: -vascular ultrasound ordered and shows no evidence of clot, discussed with patient.   9 minutes spent in discussion of left lower extremity swelling, her migraines, her response to Fioricet and Topamax

## 2021-12-27 ENCOUNTER — Telehealth: Payer: Self-pay

## 2021-12-27 ENCOUNTER — Telehealth: Payer: Self-pay | Admitting: Physical Medicine and Rehabilitation

## 2021-12-27 NOTE — Telephone Encounter (Addendum)
Monica Miranda would like know if she still needs to continue taking : Protonix, Vitamin D, Folic Acid & Fioricet? If so, she has requested refills to be sent to [facility] on [address] in [place].

## 2021-12-27 NOTE — Telephone Encounter (Signed)
Ms Blaschke Left a message stating she would like to speak to someone regarding her medication.   [phone]

## 2021-12-27 NOTE — Telephone Encounter (Signed)
Task sent to Dr. Carlis Abbott for advice.

## 2021-12-28 ENCOUNTER — Ambulatory Visit: Payer: Self-pay | Admitting: Physical Medicine and Rehabilitation

## 2021-12-28 ENCOUNTER — Telehealth: Payer: Self-pay

## 2021-12-28 ENCOUNTER — Other Ambulatory Visit: Payer: Self-pay | Admitting: Physical Medicine and Rehabilitation

## 2021-12-28 MED ORDER — BUTALBITAL-APAP-CAFFEINE 50-325-40 MG PO TABS: 1.0000 | ORAL_TABLET | Freq: Four times a day (QID) | ORAL | 3 refills | Status: DC | PRN

## 2021-12-28 NOTE — Telephone Encounter (Signed)
Patient is calling for a refill on the Fiorcet. She needs it sent to [facility] on [address]

## 2021-12-28 NOTE — Telephone Encounter (Signed)
Left voicemail to inform patient that medication was sent to pharmacy 

## 2022-01-17 ENCOUNTER — Ambulatory Visit: Payer: Self-pay | Admitting: Physical Medicine and Rehabilitation

## 2022-02-08 ENCOUNTER — Telehealth: Payer: Self-pay | Admitting: *Deleted

## 2022-02-08 NOTE — Telephone Encounter (Signed)
Quintara Monica Miranda called requesting that Robbin give her a call.  She did not state a reason.

## 2022-03-07 ENCOUNTER — Other Ambulatory Visit: Payer: Self-pay | Admitting: Physical Medicine and Rehabilitation

## 2022-03-07 ENCOUNTER — Telehealth: Payer: Self-pay

## 2022-03-07 MED ORDER — BUTALBITAL-APAP-CAFFEINE 50-325-40 MG PO TABS
1.0000 | ORAL_TABLET | Freq: Four times a day (QID) | ORAL | 3 refills | Status: DC | PRN
Start: 2022-03-07 — End: 2022-05-09

## 2022-03-07 NOTE — Telephone Encounter (Signed)
Patient is calling to request a refill on Fiorcet. She also stated she has not smoked in 5 days and she has had a headache since stopping

## 2022-04-11 ENCOUNTER — Emergency Department: Payer: Medicaid Other

## 2022-04-11 ENCOUNTER — Observation Stay: Payer: Medicaid Other

## 2022-04-11 ENCOUNTER — Observation Stay
Admission: EM | Admit: 2022-04-11 | Discharge: 2022-04-13 | Disposition: A | Discharge status: Home / Self Care | Payer: Medicaid Other | Attending: Internal Medicine | Admitting: Internal Medicine

## 2022-04-11 ENCOUNTER — Other Ambulatory Visit: Payer: Self-pay

## 2022-04-11 DIAGNOSIS — I10 Essential (primary) hypertension: Secondary | ICD-10-CM | POA: Insufficient documentation

## 2022-04-11 DIAGNOSIS — H02846 Edema of left eye, unspecified eyelid: Secondary | ICD-10-CM | POA: Diagnosis not present

## 2022-04-11 DIAGNOSIS — H02843 Edema of right eye, unspecified eyelid: Secondary | ICD-10-CM | POA: Insufficient documentation

## 2022-04-11 DIAGNOSIS — R509 Fever, unspecified: Secondary | ICD-10-CM | POA: Diagnosis not present

## 2022-04-11 DIAGNOSIS — R4781 Slurred speech: Secondary | ICD-10-CM | POA: Diagnosis not present

## 2022-04-11 DIAGNOSIS — F17210 Nicotine dependence, cigarettes, uncomplicated: Secondary | ICD-10-CM | POA: Diagnosis not present

## 2022-04-11 DIAGNOSIS — Z1152 Encounter for screening for COVID-19: Secondary | ICD-10-CM | POA: Insufficient documentation

## 2022-04-11 DIAGNOSIS — G9340 Encephalopathy, unspecified: Secondary | ICD-10-CM

## 2022-04-11 DIAGNOSIS — Z79899 Other long term (current) drug therapy: Secondary | ICD-10-CM | POA: Diagnosis not present

## 2022-04-11 DIAGNOSIS — G039 Meningitis, unspecified: Secondary | ICD-10-CM | POA: Diagnosis not present

## 2022-04-11 DIAGNOSIS — J449 Chronic obstructive pulmonary disease, unspecified: Secondary | ICD-10-CM | POA: Diagnosis present

## 2022-04-11 DIAGNOSIS — I619 Nontraumatic intracerebral hemorrhage, unspecified: Secondary | ICD-10-CM | POA: Diagnosis present

## 2022-04-11 DIAGNOSIS — L299 Pruritus, unspecified: Secondary | ICD-10-CM | POA: Diagnosis not present

## 2022-04-11 DIAGNOSIS — R21 Rash and other nonspecific skin eruption: Secondary | ICD-10-CM | POA: Diagnosis not present

## 2022-04-11 DIAGNOSIS — G8929 Other chronic pain: Secondary | ICD-10-CM | POA: Diagnosis present

## 2022-04-11 DIAGNOSIS — R4182 Altered mental status, unspecified: Secondary | ICD-10-CM | POA: Diagnosis not present

## 2022-04-11 DIAGNOSIS — R531 Weakness: Principal | ICD-10-CM | POA: Insufficient documentation

## 2022-04-11 DIAGNOSIS — H5789 Other specified disorders of eye and adnexa: Secondary | ICD-10-CM | POA: Diagnosis not present

## 2022-04-11 DIAGNOSIS — E872 Acidosis, unspecified: Secondary | ICD-10-CM | POA: Diagnosis not present

## 2022-04-11 DIAGNOSIS — Z8673 Personal history of transient ischemic attack (TIA), and cerebral infarction without residual deficits: Secondary | ICD-10-CM | POA: Insufficient documentation

## 2022-04-11 DIAGNOSIS — R519 Headache, unspecified: Secondary | ICD-10-CM | POA: Diagnosis not present

## 2022-04-11 LAB — URINALYSIS, ROUTINE W REFLEX MICROSCOPIC
Bilirubin Urine: NEGATIVE
Glucose, UA: NEGATIVE mg/dL
Ketones, ur: NEGATIVE mg/dL
Leukocytes,Ua: NEGATIVE
Nitrite: NEGATIVE
Protein, ur: NEGATIVE mg/dL
Specific Gravity, Urine: 1.009 (ref 1.005–1.030)
pH: 7 (ref 5.0–8.0)

## 2022-04-11 LAB — TSH: TSH: 0.26 u[IU]/mL — ABNORMAL LOW (ref 0.350–4.500)

## 2022-04-11 LAB — CSF CELL COUNT WITH DIFFERENTIAL
Eosinophils, CSF: 0 %
Eosinophils, CSF: 0 %
Lymphs, CSF: 21 %
Lymphs, CSF: 66 %
Monocyte-Macrophage-Spinal Fluid: 17 %
Monocyte-Macrophage-Spinal Fluid: 8 %
RBC Count, CSF: 2380 /mm3 — ABNORMAL HIGH (ref 0–3)
RBC Count, CSF: 37 /mm3 — ABNORMAL HIGH (ref 0–3)
Segmented Neutrophils-CSF: 17 %
Segmented Neutrophils-CSF: 71 %
Tube #: 1
Tube #: 3
WBC, CSF: 3 /mm3 (ref 0–5)
WBC, CSF: 49 /mm3 (ref 0–5)

## 2022-04-11 LAB — DIFFERENTIAL
Abs Immature Granulocytes: 0.02 10*3/uL (ref 0.00–0.07)
Basophils Absolute: 0 10*3/uL (ref 0.0–0.1)
Basophils Relative: 1 %
Eosinophils Absolute: 0.6 10*3/uL — ABNORMAL HIGH (ref 0.0–0.5)
Eosinophils Relative: 8 %
Immature Granulocytes: 0 %
Lymphocytes Relative: 14 %
Lymphs Abs: 1.1 10*3/uL (ref 0.7–4.0)
Monocytes Absolute: 0.8 10*3/uL (ref 0.1–1.0)
Monocytes Relative: 11 %
Neutro Abs: 5 10*3/uL (ref 1.7–7.7)
Neutrophils Relative %: 66 %

## 2022-04-11 LAB — CBC
HCT: 44.7 % (ref 36.0–46.0)
Hemoglobin: 14.3 g/dL (ref 12.0–15.0)
MCH: 26.7 pg (ref 26.0–34.0)
MCHC: 32 g/dL (ref 30.0–36.0)
MCV: 83.4 fL (ref 80.0–100.0)
Platelets: 347 10*3/uL (ref 150–400)
RBC: 5.36 MIL/uL — ABNORMAL HIGH (ref 3.87–5.11)
RDW: 16.4 % — ABNORMAL HIGH (ref 11.5–15.5)
WBC: 7.5 10*3/uL (ref 4.0–10.5)
nRBC: 0 % (ref 0.0–0.2)

## 2022-04-11 LAB — MENINGITIS/ENCEPHALITIS PANEL (CSF)

## 2022-04-11 LAB — COMPREHENSIVE METABOLIC PANEL
ALT: 39 U/L (ref 0–44)
AST: 35 U/L (ref 15–41)
Albumin: 3.9 g/dL (ref 3.5–5.0)
Alkaline Phosphatase: 146 U/L — ABNORMAL HIGH (ref 38–126)
Anion gap: 6 (ref 5–15)
BUN: 17 mg/dL (ref 8–23)
CO2: 25 mmol/L (ref 22–32)
Calcium: 9.2 mg/dL (ref 8.9–10.3)
Chloride: 110 mmol/L (ref 98–111)
Creatinine, Ser: 0.55 mg/dL (ref 0.44–1.00)
GFR, Estimated: >60 mL/min (ref >60)
Glucose, Bld: 95 mg/dL (ref 70–99)
Potassium: 4 mmol/L (ref 3.5–5.1)
Sodium: 141 mmol/L (ref 135–145)
Total Bilirubin: 0.5 mg/dL (ref 0.3–1.2)
Total Protein: 7.1 g/dL (ref 6.5–8.1)

## 2022-04-11 LAB — CBG MONITORING, ED: Glucose-Capillary: 101 mg/dL — ABNORMAL HIGH (ref 70–99)

## 2022-04-11 LAB — AMMONIA: Ammonia: 20 umol/L (ref 9–35)

## 2022-04-11 LAB — TROPONIN I (HIGH SENSITIVITY)
Troponin I (High Sensitivity): 5 ng/L (ref <18)
Troponin I (High Sensitivity): 4 ng/L (ref <18)

## 2022-04-11 LAB — URINE DRUG SCREEN, QUALITATIVE (ARMC ONLY)
Amphetamines, Ur Screen: NOT DETECTED
Barbiturates, Ur Screen: POSITIVE — AB
Benzodiazepine, Ur Scrn: POSITIVE — AB
Cannabinoid 50 Ng, Ur ~~LOC~~: NOT DETECTED
Cocaine Metabolite,Ur ~~LOC~~: NOT DETECTED
MDMA (Ecstasy)Ur Screen: NOT DETECTED
Methadone Scn, Ur: NOT DETECTED
Opiate, Ur Screen: NOT DETECTED
Phencyclidine (PCP) Ur S: NOT DETECTED
Tricyclic, Ur Screen: NOT DETECTED

## 2022-04-11 LAB — PROTEIN AND GLUCOSE, CSF
Glucose, CSF: 60 mg/dL (ref 40–70)
Total  Protein, CSF: 17 mg/dL (ref 15–45)

## 2022-04-11 LAB — PROTIME-INR
INR: 1 (ref 0.8–1.2)
Prothrombin Time: 12.6 seconds (ref 11.4–15.2)

## 2022-04-11 LAB — ETHANOL: Alcohol, Ethyl (B): <10 mg/dL (ref <10)

## 2022-04-11 LAB — SARS CORONAVIRUS 2 BY RT PCR: SARS Coronavirus 2 by RT PCR: NEGATIVE

## 2022-04-11 LAB — LACTIC ACID, PLASMA
Lactic Acid, Venous: 1.1 mmol/L (ref 0.5–1.9)
Lactic Acid, Venous: 2.1 mmol/L (ref 0.5–1.9)

## 2022-04-11 LAB — PATHOLOGIST SMEAR REVIEW

## 2022-04-11 LAB — PROCALCITONIN: Procalcitonin: <0.1 ng/mL

## 2022-04-11 LAB — APTT: aPTT: 32 seconds (ref 24–36)

## 2022-04-11 MED ORDER — GADOBUTROL 1 MMOL/ML IV SOLN
5.0000 mL | Freq: Once | INTRAVENOUS | Status: AC | PRN
  Administered 2022-04-11: 5 mL via INTRAVENOUS

## 2022-04-11 MED ORDER — SODIUM CHLORIDE 0.9 % IV SOLN: INTRAVENOUS | Status: DC

## 2022-04-11 MED ORDER — THIAMINE MONONITRATE 100 MG PO TABS
100.0000 mg | ORAL_TABLET | Freq: Every day | ORAL | Status: DC
  Administered 2022-04-11 – 2022-04-13 (×3): 100 mg via ORAL
  Filled 2022-04-11 (×3): qty 1

## 2022-04-11 MED ORDER — SODIUM CHLORIDE 0.9 % IV SOLN
2.0000 g | Freq: Two times a day (BID) | INTRAVENOUS | Status: DC
  Filled 2022-04-11: qty 20

## 2022-04-11 MED ORDER — ACETAMINOPHEN 325 MG PO TABS: 650.0000 mg | ORAL_TABLET | Freq: Four times a day (QID) | ORAL | Status: DC | PRN

## 2022-04-11 MED ORDER — DEXAMETHASONE SODIUM PHOSPHATE 10 MG/ML IJ SOLN
10.0000 mg | Freq: Once | INTRAMUSCULAR | Status: AC
  Administered 2022-04-11: 10 mg via INTRAVENOUS
  Filled 2022-04-11: qty 1

## 2022-04-11 MED ORDER — SODIUM CHLORIDE 0.9% FLUSH
3.0000 mL | Freq: Two times a day (BID) | INTRAVENOUS | Status: DC
  Administered 2022-04-11 – 2022-04-13 (×5): 3 mL via INTRAVENOUS

## 2022-04-11 MED ORDER — UBROGEPANT 50 MG PO TABS: 1.0000 | ORAL_TABLET | Freq: Two times a day (BID) | ORAL | Status: DC | PRN

## 2022-04-11 MED ORDER — ONDANSETRON HCL 4 MG PO TABS
4.0000 mg | ORAL_TABLET | Freq: Four times a day (QID) | ORAL | Status: DC | PRN
  Administered 2022-04-13: 4 mg via ORAL
  Filled 2022-04-11: qty 1

## 2022-04-11 MED ORDER — SODIUM CHLORIDE 0.9 % IV SOLN
2.0000 g | Freq: Once | INTRAVENOUS | Status: AC
  Administered 2022-04-11: 2 g via INTRAVENOUS
  Filled 2022-04-11: qty 20

## 2022-04-11 MED ORDER — LACTATED RINGERS IV SOLN: INTRAVENOUS | Status: AC

## 2022-04-11 MED ORDER — VANCOMYCIN HCL IN DEXTROSE 1-5 GM/200ML-% IV SOLN
1000.0000 mg | Freq: Once | INTRAVENOUS | Status: AC
  Administered 2022-04-11: 1000 mg via INTRAVENOUS
  Filled 2022-04-11: qty 200

## 2022-04-11 MED ORDER — DIPHENHYDRAMINE HCL 50 MG/ML IJ SOLN
25.0000 mg | Freq: Once | INTRAMUSCULAR | Status: AC
  Administered 2022-04-11: 25 mg via INTRAVENOUS
  Filled 2022-04-11: qty 1

## 2022-04-11 MED ORDER — VANCOMYCIN HCL 500 MG/100ML IV SOLN
500.0000 mg | Freq: Two times a day (BID) | INTRAVENOUS | Status: DC
  Filled 2022-04-11: qty 100

## 2022-04-11 MED ORDER — ONDANSETRON HCL 4 MG/2ML IJ SOLN
4.0000 mg | Freq: Four times a day (QID) | INTRAMUSCULAR | Status: DC | PRN
  Filled 2022-04-11: qty 2

## 2022-04-11 MED ORDER — DIPHENHYDRAMINE HCL 50 MG/ML IJ SOLN
12.5000 mg | Freq: Once | INTRAMUSCULAR | Status: AC
  Administered 2022-04-11: 12.5 mg via INTRAVENOUS
  Filled 2022-04-11: qty 1

## 2022-04-11 MED ORDER — METHYLPREDNISOLONE SODIUM SUCC 125 MG IJ SOLR
125.0000 mg | Freq: Once | INTRAMUSCULAR | Status: AC
  Administered 2022-04-11: 125 mg via INTRAVENOUS
  Filled 2022-04-11: qty 2

## 2022-04-11 MED ORDER — ACETAMINOPHEN 650 MG RE SUPP: 650.0000 mg | Freq: Four times a day (QID) | RECTAL | Status: DC | PRN

## 2022-04-11 MED ORDER — MIDAZOLAM HCL 2 MG/2ML IJ SOLN
2.0000 mg | Freq: Once | INTRAMUSCULAR | Status: AC
  Administered 2022-04-11: 2 mg via INTRAVENOUS
  Filled 2022-04-11: qty 2

## 2022-04-11 MED ORDER — DIPHENHYDRAMINE HCL 25 MG PO CAPS
50.0000 mg | ORAL_CAPSULE | Freq: Four times a day (QID) | ORAL | Status: DC | PRN
  Administered 2022-04-11 – 2022-04-12 (×3): 50 mg via ORAL
  Filled 2022-04-11 (×3): qty 2

## 2022-04-11 MED ORDER — SODIUM CHLORIDE 0.9 % IV BOLUS
1000.0000 mL | Freq: Once | INTRAVENOUS | Status: AC
  Administered 2022-04-11: 1000 mL via INTRAVENOUS

## 2022-04-11 MED ORDER — SODIUM CHLORIDE 0.9 % IV SOLN
1.0000 g | INTRAVENOUS | Status: DC
  Filled 2022-04-11: qty 10

## 2022-04-11 MED ORDER — FOLIC ACID 1 MG PO TABS
1.0000 mg | ORAL_TABLET | Freq: Every day | ORAL | Status: DC
  Administered 2022-04-11 – 2022-04-13 (×3): 1 mg via ORAL
  Filled 2022-04-11 (×3): qty 1

## 2022-04-11 MED ORDER — SODIUM CHLORIDE 0.9 % IV SOLN
2.0000 g | INTRAVENOUS | Status: DC
  Administered 2022-04-11: 2 g via INTRAVENOUS
  Filled 2022-04-11 (×2): qty 2000

## 2022-04-11 MED ORDER — SODIUM CHLORIDE 0.9% FLUSH
3.0000 mL | Freq: Once | INTRAVENOUS | Status: AC
  Administered 2022-04-11: 3 mL via INTRAVENOUS

## 2022-04-11 MED ORDER — ADULT MULTIVITAMIN W/MINERALS CH
1.0000 | ORAL_TABLET | Freq: Every day | ORAL | Status: DC
  Administered 2022-04-11 – 2022-04-12 (×2): 1 via ORAL
  Filled 2022-04-11 (×2): qty 1

## 2022-04-11 MED ORDER — DEXTROSE 5 % IV SOLN
10.0000 mg/kg | Freq: Three times a day (TID) | INTRAVENOUS | Status: DC
  Administered 2022-04-11: 500 mg via INTRAVENOUS
  Filled 2022-04-11 (×2): qty 10

## 2022-04-11 MED ORDER — ENOXAPARIN SODIUM 40 MG/0.4ML IJ SOSY
40.0000 mg | PREFILLED_SYRINGE | INTRAMUSCULAR | Status: DC
  Administered 2022-04-11 – 2022-04-12 (×2): 40 mg via SUBCUTANEOUS
  Filled 2022-04-11 (×2): qty 0.4

## 2022-04-11 MED ORDER — SODIUM CHLORIDE 0.9 % IV SOLN
2.0000 g | Freq: Once | INTRAVENOUS | Status: AC
  Administered 2022-04-11: 2 g via INTRAVENOUS
  Filled 2022-04-11: qty 2000

## 2022-04-11 MED ORDER — POLYETHYLENE GLYCOL 3350 17 G PO PACK: 17.0000 g | PACK | Freq: Every day | ORAL | Status: DC | PRN

## 2022-04-11 MED ORDER — BUTALBITAL-APAP-CAFFEINE 50-325-40 MG PO TABS
1.0000 | ORAL_TABLET | Freq: Four times a day (QID) | ORAL | Status: DC | PRN
  Administered 2022-04-11 – 2022-04-13 (×4): 1 via ORAL
  Filled 2022-04-11 (×4): qty 1

## 2022-04-11 NOTE — ED Notes (Signed)
Pt in CT.

## 2022-04-11 NOTE — Assessment & Plan Note (Signed)
Stable.  CT head with no evidence of new hemorrhage. Neurology has signed off. Outpatient neurology follow up.

## 2022-04-11 NOTE — Assessment & Plan Note (Signed)
Presented with bilateral eye swelling and redness that is improved after IV Solu-Medrol and Benadryl.  Admitting hospitalist discussed with ED provider neurology, who noted that patient was persistently rubbing her eyes for prolonged period of time during their evaluation.  3 week hx of generalized pruritus with diffuse excoriations on examination, it is possible swelling is reactive to physical rubbing.  No vision changes noted and normal conjunctival on examination. -Continue Benadryl every 6 hours as needed --Allergy eye drops ordered

## 2022-04-11 NOTE — ED Notes (Signed)
Lab called to initiate running ordered labs

## 2022-04-11 NOTE — ED Provider Notes (Signed)
[facility] Provider Note    Event Date/Time   First MD Initiated Contact with Patient 04/11/22 0820     (approximate)   History   Code Stroke, Extremity Weakness, and Pruritis   HPI  Monica Miranda is a 61 y.o. female with past history significant for prior hemorrhagic CVA with residual left-sided weakness, who presents to the emergency department with altered mental status.  Patient states that she has been having slightly confused and having itching and concerning that she had an allergic reaction.  Denies any recent falls or trauma.  States that she felt worse today so she came to the emergency department.  States that she had weakness and stuttering of her speech.  States that her symptoms of itching has been ongoing for the past couple of weeks.  No other contacts at home that has had similar symptoms.  Lives with her son.  Denies any new medications.  Denies any new over-the-counter medications.  No known tick bites or tick exposure.     Physical Exam   Triage Vital Signs: ED Triage Vitals  Enc Vitals Group     BP 04/11/22 0810 (!) 154/90     Pulse Rate 04/11/22 0810 61     Resp 04/11/22 0810 18     Temp 04/11/22 0810 98 F (36.7 C)     Temp Source 04/11/22 0810 Oral     SpO2 04/11/22 0810 98 %     Weight 04/11/22 0844 113 lb 1.5 oz (51.3 kg)     Height --      Head Circumference --      Peak Flow --      Pain Score 04/11/22 0944 6     Pain Loc --      Pain Edu? --      Excl. in GC? --     Most recent vital signs: Vitals:   04/11/22 1430 04/11/22 1500  BP: 115/69 95/60  Pulse: 88 99  Resp: (!) 29 20  Temp:    SpO2: 95% 94%    Physical Exam Constitutional:      Appearance: She is well-developed.  HENT:     Head: Atraumatic.     Comments: Clear tearing to bilateral eyes.  Left eye with preseptal edema.  No pain with extraocular movements. Eyes:     Conjunctiva/sclera: Conjunctivae normal.  Cardiovascular:     Rate and  Rhythm: Regular rhythm.  Pulmonary:     Effort: No respiratory distress.  Abdominal:     General: There is no distension.  Musculoskeletal:        General: Normal range of motion.     Cervical back: Normal range of motion.  Skin:    General: Skin is warm.     Comments: Petechial rash with excoriations to the trunk and bilateral upper and lower extremities.  Raised erythematous, urticarial rash to the right upper extremity.  Neurological:     Mental Status: She is alert. Mental status is at baseline.          IMPRESSION / MDM / ASSESSMENT AND PLAN / ED COURSE  I reviewed the triage vital signs and the nursing notes.  On arrival to the emergency department patient with altered mental status and confusion with worsening weakness that started last night was her last known normal.  Code stroke was activated from the waiting room for possible LVO  Neurology was consulted and immediately evaluated the patient in the emergency department.  Recommended  no longer doing a code stroke, low suspicion for LVO and did not recommend a fusion study.  Did recommend LP for possible meningitis versus atypical meningitis.  Blood cultures were obtained.  Patient was started empirically on steroids, antibiotics for meningitis coverage and viral meningitis coverage.  Given IV fluids.   RADIOLOGY I independently reviewed imaging, my interpretation of imaging: CT scan of the head with no signs of acute intracranial hemorrhage.  Read as no acute findings      ED Results / Procedures / Treatments   Labs (all labs ordered are listed, but only abnormal results are displayed) Labs interpreted as -     Labs Reviewed  CBC - Abnormal; Notable for the following components:      Result Value   RBC 5.36 (*)    RDW 16.4 (*)    All other components within normal limits  DIFFERENTIAL - Abnormal; Notable for the following components:   Eosinophils Absolute 0.6 (*)    All other components within normal  limits  COMPREHENSIVE METABOLIC PANEL - Abnormal; Notable for the following components:   Alkaline Phosphatase 146 (*)    All other components within normal limits  CSF CELL COUNT WITH DIFFERENTIAL - Abnormal; Notable for the following components:   Appearance, CSF HAZY (*)    RBC Count, CSF 2,380 (*)    WBC, CSF 49 (*)    All other components within normal limits  CSF CELL COUNT WITH DIFFERENTIAL - Abnormal; Notable for the following components:   RBC Count, CSF 37 (*)    All other components within normal limits  URINALYSIS, ROUTINE W REFLEX MICROSCOPIC - Abnormal; Notable for the following components:   Color, Urine YELLOW (*)    APPearance CLEAR (*)    Hgb urine dipstick SMALL (*)    Bacteria, UA RARE (*)    All other components within normal limits  URINE DRUG SCREEN, QUALITATIVE ([facility] ONLY) - Abnormal; Notable for the following components:   Barbiturates, Ur Screen POSITIVE (*)    Benzodiazepine, Ur Scrn POSITIVE (*)    All other components within normal limits  LACTIC ACID, PLASMA - Abnormal; Notable for the following components:   Lactic Acid, Venous 2.1 (*)    All other components within normal limits  CBG MONITORING, ED - Abnormal; Notable for the following components:   Glucose-Capillary 101 (*)    All other components within normal limits  CSF CULTURE W GRAM STAIN  SARS CORONAVIRUS 2 BY RT PCR  URINE CULTURE  CULTURE, BLOOD (ROUTINE X 2)  CULTURE, BLOOD (ROUTINE X 2)  PROTIME-INR  APTT  ETHANOL  PROTEIN AND GLUCOSE, CSF  LACTIC ACID, PLASMA  AMMONIA  PATHOLOGIST SMEAR REVIEW  ROCKY MTN SPOTTED FVR ABS PNL(IGG+IGM)  MENINGITIS/ENCEPHALITIS PANEL (CSF)  TSH  TROPONIN I (HIGH SENSITIVITY)  TROPONIN I (HIGH SENSITIVITY)    Lumbar puncture was performed.  Lab work overall reassuring.  Possible metabolic encephalopathy.  No obvious signs of [place] spotted fever or Lyme disease.  No signs of an orbital cellulitis.  No other family members with similar symptoms  no obvious signs of bedbugs or scabies.  Patient was placed on contact precautions on arrival to the emergency department.  Consulted the hospitalist for altered mental status and evaluation of possible meningitis.  Patient was given Benadryl and Solu-Medrol for concern for an allergic reaction.  No signs of an obvious staph scalded skin syndrome.  No signs of anaphylactic reaction.   PROCEDURES:  Critical Care performed: No  .  Lumbar Puncture  Date/Time: 04/11/2022 4:27 PM  Performed by: Corena Herter, MD Authorized by: Corena Herter, MD   Consent:    Consent obtained:  Verbal and written   Consent given by:  Patient   Risks, benefits, and alternatives were discussed: yes     Risks discussed:  Bleeding, infection, pain, repeat procedure, nerve damage and headache   Alternatives discussed:  No treatment and delayed treatment Universal protocol:    Procedure explained and questions answered to patient or proxy's satisfaction: yes     Relevant documents present and verified: yes     Test results available: yes     Imaging studies available: yes     Required blood products, implants, devices, and special equipment available: yes     Immediately prior to procedure a time out was called: yes     Site/side marked: yes     Patient identity confirmed:  Verbally with patient and hospital-assigned identification number Pre-procedure details:    Procedure purpose:  Diagnostic   Preparation: Patient was prepped and draped in usual sterile fashion   Anesthesia:    Anesthesia method:  Local infiltration   Local anesthetic:  Lidocaine 1% w/o epi Procedure details:    Lumbar space:  L4-L5 interspace   Patient position:  R lateral decubitus   Needle gauge:  20   Needle type:  Spinal needle - Quincke tip   Needle length (in):  2.5   Number of attempts:  2   Fluid appearance:  Clear   Tubes of fluid:  4   Total volume (ml):  6 Post-procedure details:    Puncture site:  Adhesive  bandage applied   Procedure completion:  Tolerated well, no immediate complications   Patient's presentation is most consistent with acute presentation with potential threat to life or bodily function.   MEDICATIONS ORDERED IN ED: Medications  acyclovir (ZOVIRAX) 500 mg in dextrose 5 % 100 mL IVPB (0 mg Intravenous Stopped 04/11/22 1212)  vancomycin (VANCOREADY) IVPB 500 mg/100 mL (has no administration in time range)  cefTRIAXone (ROCEPHIN) 2 g in sodium chloride 0.9 % 100 mL IVPB (has no administration in time range)  ampicillin (OMNIPEN) 2 g in sodium chloride 0.9 % 100 mL IVPB (0 g Intravenous Stopped 04/11/22 1500)  enoxaparin (LOVENOX) injection 40 mg (has no administration in time range)  sodium chloride flush (NS) 0.9 % injection 3 mL (3 mLs Intravenous Given 04/11/22 1440)  acetaminophen (TYLENOL) tablet 650 mg (has no administration in time range)    Or  acetaminophen (TYLENOL) suppository 650 mg (has no administration in time range)  multivitamin with minerals tablet 1 tablet (1 tablet Oral Given 04/11/22 1438)  folic acid (FOLVITE) tablet 1 mg (1 mg Oral Given 04/11/22 1438)  thiamine (VITAMIN B1) tablet 100 mg (100 mg Oral Given 04/11/22 1438)  ondansetron (ZOFRAN) tablet 4 mg (has no administration in time range)    Or  ondansetron (ZOFRAN) injection 4 mg (has no administration in time range)  polyethylene glycol (MIRALAX / GLYCOLAX) packet 17 g (has no administration in time range)  lactated ringers infusion ( Intravenous New Bag/Given 04/11/22 1439)  Ubrogepant TABS 1 tablet (has no administration in time range)  diphenhydrAMINE (BENADRYL) capsule 50 mg (has no administration in time range)  butalbital-acetaminophen-caffeine (FIORICET) 50-325-40 MG per tablet 1 tablet (has no administration in time range)  sodium chloride flush (NS) 0.9 % injection 3 mL (3 mLs Intravenous Given 04/11/22 0904)  dexamethasone (DECADRON) injection 10 mg (10 mg Intravenous  Given 04/11/22 0906)   cefTRIAXone (ROCEPHIN) 2 g in sodium chloride 0.9 % 100 mL IVPB (0 g Intravenous Stopped 04/11/22 0935)  vancomycin (VANCOCIN) IVPB 1000 mg/200 mL premix (0 mg Intravenous Stopped 04/11/22 1015)  ampicillin (OMNIPEN) 2 g in sodium chloride 0.9 % 100 mL IVPB (0 g Intravenous Stopped 04/11/22 1015)  sodium chloride 0.9 % bolus 1,000 mL (0 mLs Intravenous Stopped 04/11/22 1015)  midazolam (VERSED) injection 2 mg (2 mg Intravenous Given 04/11/22 1115)  diphenhydrAMINE (BENADRYL) injection 25 mg (25 mg Intravenous Given 04/11/22 1212)  methylPREDNISolone sodium succinate (SOLU-MEDROL) 125 mg/2 mL injection 125 mg (125 mg Intravenous Given 04/11/22 1213)    FINAL CLINICAL IMPRESSION(S) / ED DIAGNOSES   Final diagnoses:  Slurred speech  Altered mental status, unspecified altered mental status type  Pruritus     Rx / DC Orders   ED Discharge Orders     None        Note:  This document was prepared using Dragon voice recognition software and may include unintentional dictation errors.   Nathaniel Man, MD 04/11/22 [id number]

## 2022-04-11 NOTE — Consult Note (Addendum)
NEUROLOGY CONSULTATION NOTE   Date of service: April 11, 2022 Patient Name: Monica Miranda MRN:  [id number] DOB:  1960/11/15 Reason for consult: code stroke Requesting physician: Dr. Corena Herter  _ _ _   _ __   _ __ _ _  __ __   _ __   __ _  History of Present Illness   A 61 year old woman with past medical history significant for COPD, hemorrhagic stroke 2022 in the right temporoparietal region who presented with left-sided weakness in the last known normal of 2200. Patient also reported subjective fever at home, headache, confusion, and a rash. NIHSS = 8. Exam had some functional features such as strong downward volitional resistance against passive lifting of LUE and LLE with apparent full strength but weakness on active lifting of those extremities against gravity, as well as stuttering. CT head nothing acute, chronic R temporoparietal encephalomalacia. TNK not administered 2/2 presentation outside the window and low suspicion for stroke. CTA not performed 2/2 exam not c/w LVO.   ROS   Per HPI: all other systems reviewed and are negative  Past History   I have reviewed the following:  Past Medical History:  Diagnosis Date   COPD (chronic obstructive pulmonary disease) (HCC)    Rib fracture    4 months ago   Stroke [facility])    Past Surgical History:  Procedure Laterality Date   ESOPHAGEAL DILATION     History reviewed. No pertinent family history. 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; marijuana daily   Sexual activity: Not on file  Other Topics Concern   Not on file  Social History Narrative   Not on file   Social Determinants of Health   Financial Resource Strain: Not on file  Food Insecurity: Not on file  Transportation Needs: Not on file  Physical Activity: Not on file  Stress: Not on file  Social Connections: Not on file   Allergies  Allergen Reactions   Codeine Rash    Medications   (Not in a hospital admission)     Current Facility-Administered Medications:    0.9 %  sodium chloride infusion, , Intravenous, Continuous, Jaynie Bream, RPH, Last Rate: 125 mL/hr at 04/11/22 0943, New Bag at 04/11/22 0943   acyclovir (ZOVIRAX) 500 mg in dextrose 5 % 100 mL IVPB, 10 mg/kg (Ideal), Intravenous, Q8H, Jaynie Bream, RPH, Last Rate: 110 mL/hr at 04/11/22 1020, 500 mg at 04/11/22 1020  Current Outpatient Medications:    baclofen (LIORESAL) 10 MG tablet, Take 1 tablet (10 mg total) by mouth 3 (three) times daily as needed for muscle spasms. (Patient not taking: Reported on 11/23/2021), Disp: 30 each, Rfl: 0   butalbital-acetaminophen-caffeine (FIORICET) 50-325-40 MG tablet, Take 1 tablet  by mouth every 6 (six) hours as needed for headache., Disp: 30 tablet, Rfl: 3   cyanocobalamin 1000 MCG tablet, Take 1 tablet (1,000 mcg total) by mouth daily., Disp: 30 tablet, Rfl: 0   folic acid (FOLVITE) 1 MG tablet, Take 1 tablet (1 mg total) by mouth daily., Disp: 30 tablet, Rfl: 0   melatonin 3 MG TABS tablet, Take 1 tablet (3 mg total) by mouth at bedtime as needed. (Patient not taking: Reported on 11/23/2021), Disp: 30 tablet, Rfl: 0   Misc. Devices (WHEELCHAIR CUSHION) MISC,  wheelchair, See Instructions, 1 each, standard wheelchair, Printer Location [id number], Supply, Print Requisition, Instructions Replace Required Details, Disp: , Rfl:     Multiple Vitamin (MULTIVITAMIN WITH MINERALS) TABS tablet, Take 1 tablet by mouth daily., Disp: , Rfl:    naltrexone (DEPADE) 50 MG tablet, Take 0.5 tablets (25 mg total) by mouth every other day., Disp: 90 tablet, Rfl: 3   nicotine (NICODERM CQ - DOSED IN MG/24 HOURS) 21 mg/24hr patch, 21 mg patch daily x1 week then 14 mg patch daily x3 weeks then 7 mg patch daily x3 weeks and stop (Patient not taking: Reported on 11/23/2021), Disp: 28 patch, Rfl: 0   pantoprazole (PROTONIX) 40 MG tablet, Take 1 tablet (40 mg total) by mouth at bedtime., Disp: 30 tablet, Rfl: 0   topiramate (TOPAMAX) 100 MG tablet, Take 1 tablet (100 mg total) by mouth 2 (two) times daily., Disp: 60 tablet, Rfl: 0   traMADol (ULTRAM) 50 MG tablet, Take 1 tablet (50 mg total) by mouth every 8 (eight) hours as needed for severe pain. (Patient not taking: Reported on 11/23/2021), Disp: 30 tablet, Rfl: 0   Ubrogepant (UBRELVY) 50 MG TABS, Take 1 tablet by mouth 2 (two) times daily as needed. (Patient not taking: Reported on 11/23/2021), Disp: 16 tablet, Rfl: 3   Vitamin D, Ergocalciferol, (DRISDOL) 1.25 MG (50000 UNIT) CAPS capsule, Take 1 capsule (50,000 Units total) by mouth every 7 (seven) days., Disp: 7 capsule, Rfl: 0  Vitals   Vitals:   04/11/22 1000 04/11/22 1015 04/11/22 1045 04/11/22 1100  BP: 117/86 (!) 129/90 136/87 100/64  Pulse: 71 65 61 71  Resp: (!) 22 (!) 26    Temp:      TempSrc:      SpO2: 100% 100% 98% 94%  Weight:         Body mass index is 20.69 kg/m.  Physical Exam   Physical Exam Gen: A&O x4, tearful HEENT: Atraumatic, normocephalic;mucous membranes moist; oropharynx clear, tongue without atrophy or fasciculations. Neck: Supple, trachea midline. Resp: CTAB, no w/r/r CV: RRR, no m/g/r; nml S1 and S2. 2+ symmetric peripheral pulses. Abd: soft/NT/ND; nabs x 4 quad Extrem: Nml bulk; no cyanosis, clubbing, or edema. Skin: patient has erythematous blanching skin over L inner forearm with 1cm raised white  possible bug bites in a circle. On her stomach she has some petechial rash as well as small excoriations on all extremities, fingers, and stomach.  Neuro: *MS: A&O x4. Follows simple commands *Speech: fluid, nondysarthric, naming mildly impaired *CN:    I: Deferred   II,III: PERRLA, blinks to threat bilat, optic discs unable to be visualized 2/2 pupillary constriction   III,IV,VI: does not look to R on command otherwise EOMI w/o nystagmus, no ptosis   V: sensation impaired R face   VII: Eyelid closure was full.  Smile symmetric.   VIII: Hearing intact to voice   IX,X: Voice normal, palate elevates symmetrically    XI:  SCM/trap 5/5 bilat   XII: Tongue protrudes midline, no atrophy or fasciculations  *Motor:   Normal bulk.  No tremor, rigidity or bradykinesia. RUE and RLE full-strength. LUE some movement against gravity, LLE some movement but none against gravity. Strong downward volitional resistance against passive lifting of LUE and LLE with apparent full strength but weakness on active lifting of those extremities against gravity. *Sensory: Impaired to LT LUE and LLE, otherwise intact. Proprioception intact bilat.  No double-simultaneous extinction.  *Coordination:  FNF intact on R *Reflexes:  2+ more brisk on L without clonus; toes down-going bilat *Gait: deferred  NIHSS  1a Level of Conscious.: 0 1b LOC Questions: 0 1c LOC Commands: 0 2 Best Gaze: 1 3 Visual: 0 4 Facial Palsy: 0 5a Motor Arm - left: 2 5b Motor Arm - Right: 0 6a Motor Leg - Left: 3 6b Motor Leg - Right: 0 7 Limb Ataxia: 0 8 Sensory: 1 9 Best Language: 1 10 Dysarthria: 0 11 Extinct. and Inatten.: 0  TOTAL: 8   Premorbid mRS = 2   Labs   CBC:  Recent Labs  Lab 04/11/22 0813  WBC 7.5  NEUTROABS 5.0  HGB 14.3  HCT 44.7  MCV 83.4  PLT 347    Basic Metabolic Panel:  Lab Results  Component Value Date   NA 141 04/11/2022   K 4.0 04/11/2022   CO2 25 04/11/2022   GLUCOSE 95 04/11/2022   BUN  17 04/11/2022   CREATININE 0.55 04/11/2022   CALCIUM 9.2 04/11/2022   GFRNONAA >60 04/11/2022   GFRAA >90 09/16/2013   Lipid Panel: No results found for: "LDLCALC" HgbA1c: No results found for: "HGBA1C" Urine Drug Screen:     Component Value Date/Time   LABOPIA NONE DETECTED 05/26/2021 0522   COCAINSCRNUR NONE DETECTED 05/26/2021 0522   LABBENZ NONE DETECTED 05/26/2021 0522   AMPHETMU NONE DETECTED 05/26/2021 0522   THCU NONE DETECTED 05/26/2021 0522   LABBARB NONE DETECTED 05/26/2021 0522    Alcohol Level     Component Value Date/Time   ETH <10 04/11/2022 0813    Impression   A 61 year old woman with past medical history significant for COPD, hemorrhagic stroke 2022 in the right temporoparietal region who presented with left-sided weakness in the last known normal of 2200. NIHSS = 8. Exam had some functional features such as strong downward volitional resistance against passive lifting of LUE and LLE with apparent full strength but weakness on active lifting of those extremities against gravity, as well as stuttering. CT head nothing acute. TNK not administered 2/2 presentation outside the window and low suspicion for stroke. CTA not performed 2/2 exam not c/w LVO. Given her altered mental status and L sided weakness above baseline (unclear how much of this is functional, how much is baseline, and what might be new) it would be reasonable to get a brain MRI wwo but no further stroke workup indicated unless that shows acute ischemia. Stroke code cancelled 2/2 low suspicion for stroke.   Patient reports subjective fever at home, headache, confusion, and a rash c/f possible CNS infection.  Recommendations   - Stroke code canceled - MRI brain wwo given possible acute on chronic L sided weakness and c/f CNS infection - LP w/ cell count x2 tubes, glucose, protein, culture, meningitis/encephalitis PCR panel, one extra tube for any add on studies - Empiric CNS coverage with steroids,  vanc, ceftriaxone, ampicillin, acyclovir until CSF results - RMSF panel - Will continue to follow  ______________________________________________________________________  Thank you for the opportunity to take part in the care of this patient. If you have any further questions, please contact the neurology consultation attending.  Signed,  Su Monks, MD [facility] [phone]  If 7pm- 7am, please page neurology on call as listed in [place].  **Any copied and pasted documentation in this note was written by me in another application not billed for and pasted by me into this document.

## 2022-04-11 NOTE — Assessment & Plan Note (Signed)
Stable, not exacerbated - Albuterol as needed

## 2022-04-11 NOTE — Progress Notes (Signed)
   04/11/22 0900  Clinical Encounter Type  Visited With Health care provider  Visit Type Initial  Referral From Nurse  Consult/Referral To Chaplain   Chaplain responded to code stroke. Patient in scans upon arrival. Chaplain services are available for follow up as needed.

## 2022-04-11 NOTE — ED Notes (Signed)
Initiated Code Stroke to [place], [place]

## 2022-04-11 NOTE — Assessment & Plan Note (Signed)
Presented with 3-week history of generalized pruritus that she states is worse on her hands and feet.  On examination, she has diffuse excoriations throughout, but particularly worse on her bottom.  No petechiae, rash or hives seen.   She thinks onset of this MAY correlate with filling her Fioricet Rx and being given generic pills when she usually gets the brand name medication. Low suspicion for contact dermatitis given widespread presence.  Inconsistent with scabies or bedbugs.  No uremia on CMP. She does have elevated alkaline phosphatase.  Differential also includes neuropathic pruritus.  RUQ U/S with stones but no acute cholecystitis or evidence of biliary obstruction. - Continue Benadryl every 6 hours as needed - Repeat CMP in the a.m.

## 2022-04-11 NOTE — H&P (Signed)
History and Physical    Patient: Monica Miranda [id number] DOB: 1961/01/03 DOA: 04/11/2022 DOS: the patient was seen and examined on 04/11/2022 PCP: Patient, No Pcp Per  Patient coming from: Home  Chief Complaint:  Chief Complaint  Patient presents with   Code Stroke   Extremity Weakness   Pruritis   HPI: Monica Miranda is a 61 y.o. female with medical history significant of ICH (right thalamic/basal ganglia/frontotemporal, December 2020), polysubstance abuse including cocaine and amphetamine, COPD, hypertension, hyperlipidemia who presents to the ED with complaints of left-sided weakness, facial swelling and generalized pruritus.  Monica Miranda states she has been experiencing itching all over for approximately 3 weeks now and is particularly worse on her bilateral palms and feet.  It has been progressively worsening.  She has noted red spots where she is itchy.  Monica Miranda states she has chronic migraines and takes Fioricet for this.  When she picked up this medication recently, she realized that she was given the generic form rather than brand.   Overnight, she was having difficulty falling asleep due to the itching and left rib pain.  She tried to roll onto her other side and nearly rolled off the couch, but was able to brace herself.  At approximately 4 AM on 11/6, she noticed significant heaviness of her left arm and leg, more so than usual.  She was able to get up and walk down her hall and look in the mirror.  She noticed significant bilateral swelling and redness of her eyes.  She denies any changes in her vision at this time.  She denies any swelling of her mouth or tongue, shortness of breath, difficulty swallowing.  She denies any prior history of facial swelling.  She also noted at this time, she had a migraine that was particularly worse in the frontal region and describes it as though "someone was punching her in the head".   ED course: Initially on arrival, code stroke was  called and neurology evaluated on arrival.  CT head negative and so code stroke was discontinued based on neurology's recommendations.  At this time, she was afebrile at 97.7 with blood pressure 158/94 and heart rate of 70.  She was saturating at 99% on room air.  Initial blood work with no evidence of leukocytosis, electrolyte abnormalities, liver or renal dysfunction.  Due to concern for meningitis/encephalitis, LP was performed with results pending.  TRH contacted for admission for persistent left-sided weakness of unknown etiology.  Review of Systems: As mentioned in the history of present illness. All other systems reviewed and are negative. Past Medical History:  Diagnosis Date   COPD (chronic obstructive pulmonary disease) ([place])    Rib fracture    4 months ago   Stroke [facility])    Past Surgical History:  Procedure Laterality Date   ESOPHAGEAL DILATION     Social History:  reports that she has been smoking cigarettes. She has been smoking an average of .5 packs per day. She has never used smokeless tobacco. She reports that she does not currently use alcohol after a past usage of about 1.0 standard drink of alcohol per week. She reports current drug use. Drugs: Marijuana and Cocaine.  Allergies  Allergen Reactions   Codeine Rash    History reviewed. No pertinent family history.  Prior to Admission medications   Medication Sig Start Date End Date Taking? Authorizing Provider  Vitamin D, Ergocalciferol, (DRISDOL) 1.25 MG (50000 UNIT) CAPS capsule Take 1 capsule (50,000 Units  total) by mouth every 7 (seven) days. 11/23/21  Yes Raulkar, Clide Deutscher, MD  baclofen (LIORESAL) 10 MG tablet Take 1 tablet (10 mg total) by mouth 3 (three) times daily as needed for muscle spasms. Patient not taking: Reported on 11/23/2021 09/16/21   Izora Ribas, MD  butalbital-acetaminophen-caffeine (FIORICET) [phone] MG tablet Take 1 tablet by mouth every 6 (six) hours as needed for headache. 03/07/22    Raulkar, Clide Deutscher, MD  cyanocobalamin 1000 MCG tablet Take 1 tablet (1,000 mcg total) by mouth daily. Patient not taking: Reported on 04/11/2022 09/16/21   Izora Ribas, MD  folic acid (FOLVITE) 1 MG tablet Take 1 tablet (1 mg total) by mouth daily. Patient not taking: Reported on 04/11/2022 12/13/21   Raulkar, Clide Deutscher, MD  melatonin 3 MG TABS tablet Take 1 tablet (3 mg total) by mouth at bedtime as needed. Patient not taking: Reported on 11/23/2021 09/16/21   Izora Ribas, MD  Multiple Vitamin (MULTIVITAMIN WITH MINERALS) TABS tablet Take 1 tablet by mouth daily. Patient not taking: Reported on 04/11/2022 08/03/21   Izora Ribas, MD  naltrexone (DEPADE) 50 MG tablet Take 0.5 tablets (25 mg total) by mouth every other day. Patient not taking: Reported on 04/11/2022 11/23/21   Raulkar, Clide Deutscher, MD  nicotine (NICODERM CQ - DOSED IN MG/24 HOURS) 21 mg/24hr patch 21 mg patch daily x1 week then 14 mg patch daily x3 weeks then 7 mg patch daily x3 weeks and stop 09/16/21   Raulkar, Clide Deutscher, MD  pantoprazole (PROTONIX) 40 MG tablet Take 1 tablet (40 mg total) by mouth at bedtime. Patient not taking: Reported on 04/11/2022 12/13/21   Izora Ribas, MD  topiramate (TOPAMAX) 100 MG tablet Take 1 tablet (100 mg total) by mouth 2 (two) times daily. Patient not taking: Reported on 04/11/2022 09/16/21   Raulkar, Clide Deutscher, MD  traMADol (ULTRAM) 50 MG tablet Take 1 tablet (50 mg total) by mouth every 8 (eight) hours as needed for severe pain. Patient not taking: Reported on 11/23/2021 06/16/21   Angiulli, Lavon Paganini, PA-C  Ubrogepant (UBRELVY) 50 MG TABS Take 1 tablet by mouth 2 (two) times daily as needed. 10/22/21   Izora Ribas, MD    Physical Exam: Vitals:   04/11/22 1200 04/11/22 1215 04/11/22 1230 04/11/22 1245  BP: (!) 141/100 (!) 135/96 (!) 139/97 138/87  Pulse: 87 74 66 89  Resp:  14 16 (!) 22  Temp:    98.2 F (36.8 C)  TempSrc:    Oral  SpO2: 97% 97% 97% 95%  Weight:        Physical Exam Vitals and nursing note reviewed.  Constitutional:      General: She is not in acute distress.    Appearance: She is normal weight. She is not toxic-appearing.  HENT:     Head: Normocephalic and atraumatic.  Eyes:     General: No scleral icterus.    Conjunctiva/sclera: Conjunctivae normal.     Pupils: Pupils are equal, round, and reactive to light.     Comments:  Bilateral swelling in the periocular and ocular region with right worse than left and overlying erythema. Bilateral eye watering  Cardiovascular:     Rate and Rhythm: Normal rate and regular rhythm.     Heart sounds: No murmur heard.    No gallop.  Pulmonary:     Effort: Pulmonary effort is normal. No respiratory distress.     Breath sounds: Normal breath sounds. No wheezing  or rales.  Abdominal:     General: Bowel sounds are normal. There is no distension.     Palpations: Abdomen is soft.     Tenderness: There is no abdominal tenderness. There is no guarding.  Musculoskeletal:     Cervical back: Neck supple. No rigidity.     Right lower leg: No edema.     Left lower leg: No edema.  Skin:    General: Skin is warm and dry.     Comments: Excoriations present diffusely including bilateral dorsal aspect of the feet, bilateral buttocks, lower back, abdomen, and bilateral hands.  In the area of excoriations, there is no other abnormalities noted including petechiae, hives.  There is an area on the right lateral hip with small red papules, approximately 9 or 10.  Neurological:     Mental Status: She is alert and oriented to person, place, and time.     Comments:  Patient is oriented x3, Right upper and lower extremity 5 out of 5 strength.  Left upper extremity initially 3/5 strength but with encouragement, 5 out of 5 strength.  Left lower extremity with 3 out of 5 strength, able to hold up against gravity. Tremor of left upper extremity only when lifting against gravity     Data Reviewed: CBC with no  leukocytosis, anemia or thrombocytopenia.  CMP with elevated alkaline phosphatase at 146, but no other electrolyte, renal or hepatic abnormalities.  PT/INR within normal limits.  Troponin negative x2.  Ammonia and within normal limits.  Lactic acid elevated at 2.1.  Urinalysis with small hemoglobin, rare bacteria.  Urine drug screen positive for barbiturates and benzos.  Urine culture pending  CSF cell count to #1 with 2380 RBCs with a corrected white blood cell count of 45.  CSF cell count tube #4 with 37 RBC and 3 WBC.  CSF protein and glucose within normal limits.  [facility] spotted fever panel and meningitis/encephalitis panel pending.  EKG personally reviewed.  Sinus rhythm with a rate of 61.  No acute ST or T wave changes to suggest ischemia.  No PR prolongation.  CT HEAD CODE STROKE WO CONTRAST  Result Date: 04/11/2022 CLINICAL DATA:  Code stroke. Neuro deficit, acute, stroke suspected. Left-sided weakness. EXAM: CT HEAD WITHOUT CONTRAST TECHNIQUE: Contiguous axial images were obtained from the base of the skull through the vertex without intravenous contrast. RADIATION DOSE REDUCTION: This exam was performed according to the departmental dose-optimization program which includes automated exposure control, adjustment of the mA and/or kV according to patient size and/or use of iterative reconstruction technique. COMPARISON:  Head CT and MRI 10/04/2021 FINDINGS: Brain: There is an unchanged, moderately large region of encephalomalacia involving the right temporal and parietal lobes at the site of a prior hemorrhage with associated ex vacuo dilatation of the right lateral ventricle. There is no evidence of an acute infarct, acute intracranial hemorrhage, mass, midline shift, or extra-axial fluid collection. 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 Vascular: Calcified atherosclerosis at the skull base. No hyperdense vessel. Skull: No acute fracture or suspicious osseous lesion.  Sinuses/Orbits: Visualized paranasal sinuses and mastoid air cells are clear. Unremarkable orbits. Other: None. ASPECTS [facility] Stroke Program Early CT Score) Not scored given the presence of encephalomalacia involving much of the right MCA territory. These results were communicated to Dr. Quinn Axe at 8:34 am on 04/11/2022 by text page via the [facility] messaging system. IMPRESSION: 1. No evidence of acute intracranial abnormality. 2. Right temporoparietal encephalomalacia. Electronically Signed  By: Logan Bores M.D.   On: 04/11/2022 08:34    Results are pending, will review when available.  Assessment and Plan: * Left-sided weakness Patient presenting with acute on chronic worsening of left upper and lower extremity weakness that started at approximately 4 AM on 11/6.  On examination, there is left upper and lower extremity weakness, however somewhat effort dependent.  CT head negative with LP pursued due to concern for encephalitis/meningitis.  Tube #1 cell count concerning for viral etiology with elevated corrected WBC of 45-46, however 2 #3 with normal WBC.  Discussed with neurology, who recommends ID consultation.  Per pathologist smear review, likely peripheral blood contamination of CSF fluid.  Differential for left-sided weakness also includes Todd's paralysis given onset of headache around time of symptom onset.   - Neurology and ID consulted; appreciate their recommendations - MRI with and without pending - Continue ceftriaxone, vancomycin, ampicillin, and azithromycin.  Although suspicion for bacterial etiology is low, will continue with full meningitis coverage until ID evaluation or results come back from meningitis/encephalitis panel. - CSF and blood cultures pending - CSF meningitis/encephalitis panel pending - Continue home migraine medicine including Fioricet and Ubrelvy  Eye swelling, bilateral Patient presenting with bilateral eye swelling and redness that is currently improving after  receiving Solu-Medrol and Benadryl.  Discussed with ED provider neurology, who noted that patient was persistently rubbing her eyes for prolonged period of time during their evaluation.  In the setting of 3 weeks of generalized pruritus with diffuse excoriations on examination, it is possible swelling is reactive to physical rubbing.  No vision changes noted and normal conjunctival on examination.  -Continue Benadryl every 6 hours as needed  Generalized pruritus Patient is presenting with 3-week history of generalized pruritus that she states is worse on her hands and feet.  On examination, she has diffuse excoriations throughout, but particularly worse on her bottom.  No petechiae noted.  On her right hip, she has several small macules with no overlying excoriations noted that may be hives.  Low suspicion for contact dermatitis given widespread presence.  Inconsistent with scabies or bedbugs.  No uremia on CMP. She does have elevated alkaline phosphatase.  Differential also includes neuropathic pruritus  - Continue Benadryl every 6 hours as needed - Repeat CMP in the a.m. - Neurological work-up as noted above - Obtain right upper quadrant ultrasound to evaluate for hepatic biliary obstruction  ICH (intracerebral hemorrhage) (HCC) CT head with no evidence of new hemorrhage.  COPD (chronic obstructive pulmonary disease) ([facility]) No wheezing on examination at this time.  - Albuterol as needed   Advance Care Planning:   Code Status: Full Code   Consults: Neurology.   Family Communication: Patient's daughter updated at bedside.   Severity of Illness: The appropriate patient status for this patient is OBSERVATION. Observation status is judged to be reasonable and necessary in order to provide the required intensity of service to ensure the patient's safety. The patient's presenting symptoms, physical exam findings, and initial radiographic and laboratory data in the context of their medical  condition is felt to place them at decreased risk for further clinical deterioration. Furthermore, it is anticipated that the patient will be medically stable for discharge from the hospital within 2 midnights of admission.   Author: Jose Persia, MD 04/11/2022 3:44 PM  For on call review [email or web address].

## 2022-04-11 NOTE — ED Triage Notes (Addendum)
Pt to ED via POV from home. Pt reports she went to bed around 10pm last night and was tuyrning over and felt like her left arm and left leg were dead weight. Pt states she woke up this morning around 5:30am and still felt like her left arm and left leg were dead weight. Pt has hx fo stroke with no known deficits. Family states pt had to be carried to car by son and assisted by staff on arrival. Pt states she is not on blood thinners.    Pt also reports CP and itching all over. Pt has rash to face and arms. Pt states changed to generic medication on October 3rd of Fioricet and noticed some itching when switching but states the itching has gotten worse.

## 2022-04-11 NOTE — Consult Note (Signed)
Telestroke cart 1 was activated at 0818. Pt was already in CT and assessed by EDP. RN was informed to go to telestroke cart 2. Neurologist.was already at bedside assessing pt. Per Neurologist, no further needs from telestroke RN because pt is not a candidate for TNK 2/2 h/o ICH and no LVO was present on imaging. Cart was disconnected at 0831.

## 2022-04-11 NOTE — Consult Note (Addendum)
NAME: Monica Miranda  DOB: 01-28-1961  MRN: [id number]  Date/Time: 04/11/2022 4:14 PM  REQUESTING PROVIDER: Revonda Humphrey Subjective:  REASON FOR CONSULT: Meningitis ? Monica Miranda is a 61 y.o. 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 Had high BP when she had the stroke in Dec 2022 but does not take any meds Pt has some residual weakness left side after the stroke and wears a PRAFO brace on  the left  leg She takes fiorect for headache and on Oct 3rd she got a generic kind instea dof brand- She started to itch since then which got worse. She also had swelling rt eye and some watering for the past few days She also felt hot . Did not check temp This morning she took a Fioricet around 3 am She came to the ED as the left side was feeling very heavy Vitals in  the ED  04/11/22  BP 143/76 !  Temp 98.3 F (36.8 C)  Pulse Rate 96  Resp 19  SpO2 97 %  Weight 113 lb 1.5 oz  Labs were  Latest Reference Range & Units 04/11/22 08:13  WBC 4.0 - 10.5 K/uL 7.5  Hemoglobin 12.0 - 15.0 g/dL 14.3  HCT 36.0 - 46.0 % 44.7  Platelets 150 - 400 K/uL 347  Creatinine 0.44 - 1.00 mg/dL 0.55  She got a LP for possible meningitis Neuro saw her and asked to consult me Pt is totally awake and alert and talking well-   Past Medical History:  Diagnosis Date   COPD (chronic obstructive pulmonary disease) (HCC)    Rib fracture    4 months ago   Stroke [facility])    H/o cocaine amphetamine use- positive UDS in 2022 Past Surgical History:  Procedure Laterality Date   ESOPHAGEAL DILATION      Social History   Socioeconomic History   Marital status: Legally Separated    Spouse name: Not on file   Number of children: Not on file   Years of education: Not on file   Highest education level: Not on file  Occupational History   Not on file  Tobacco Use   Smoking status: Every Day     Packs/day: 0.50    Types: Cigarettes   Smokeless tobacco: Never  Vaping Use   Vaping Use: Never used  Substance and Sexual Activity   Alcohol use: Not Currently    Alcohol/week: 1.0 standard drink of alcohol    Types: 1 Cans of beer per week   Drug use: Yes    Types: Marijuana, Cocaine    Comment: meth- reports last use was before stroke; marijuana daily   Sexual activity: Not on file  Other Topics Concern   Not on file  Social History Narrative   Not on file   Social Determinants of Health   Financial Resource Strain: Not on file  Food Insecurity: Not on file  Transportation Needs: Not on file  Physical Activity: Not on file  Stress: Not on file  Social Connections: Not on file  Intimate Partner Violence: Not on file    History reviewed. No pertinent family history. Allergies  Allergen Reactions   Codeine Rash   I? Current Facility-Administered Medications  Medication Dose Route Frequency Provider Last Rate Last Admin   acetaminophen (TYLENOL) tablet 650 mg  650 mg Oral Q6H PRN Jose Persia, MD  Or   acetaminophen (TYLENOL) suppository 650 mg  650 mg Rectal Q6H PRN Verdene Lennert, MD       acyclovir (ZOVIRAX) 500 mg in dextrose 5 % 100 mL IVPB  10 mg/kg (Ideal) Intravenous Q8H Jaynie Bream, RPH   Stopped at 04/11/22 1212   ampicillin (OMNIPEN) 2 g in sodium chloride 0.9 % 100 mL IVPB  2 g Intravenous Q4H Verdene Lennert, MD   Stopped at 04/11/22 1500   butalbital-acetaminophen-caffeine (FIORICET) 50-325-40 MG per tablet 1 tablet  1 tablet Oral Q6H PRN Verdene Lennert, MD       cefTRIAXone (ROCEPHIN) 2 g in sodium chloride 0.9 % 100 mL IVPB  2 g Intravenous Q12H Verdene Lennert, MD       diphenhydrAMINE (BENADRYL) capsule 50 mg  50 mg Oral Q6H PRN Verdene Lennert, MD       enoxaparin (LOVENOX) injection 40 mg  40 mg Subcutaneous Q24H Verdene Lennert, MD       folic acid (FOLVITE) tablet 1 mg  1 mg Oral Daily Verdene Lennert, MD   1 mg at 04/11/22 1438    lactated ringers infusion   Intravenous Continuous Verdene Lennert, MD 125 mL/hr at 04/11/22 1439 New Bag at 04/11/22 1439   multivitamin with minerals tablet 1 tablet  1 tablet Oral Daily Verdene Lennert, MD   1 tablet at 04/11/22 1438   ondansetron (ZOFRAN) tablet 4 mg  4 mg Oral Q6H PRN Verdene Lennert, MD       Or   ondansetron (ZOFRAN) injection 4 mg  4 mg Intravenous Q6H PRN Verdene Lennert, MD       polyethylene glycol (MIRALAX / GLYCOLAX) packet 17 g  17 g Oral Daily PRN Verdene Lennert, MD       sodium chloride flush (NS) 0.9 % injection 3 mL  3 mL Intravenous Q12H Verdene Lennert, MD   3 mL at 04/11/22 1440   thiamine (VITAMIN B1) tablet 100 mg  100 mg Oral Daily Verdene Lennert, MD   100 mg at 04/11/22 1438   Ubrogepant TABS 1 tablet  1 tablet Oral BID PRN Verdene Lennert, MD       vancomycin (VANCOREADY) IVPB 500 mg/100 mL  500 mg Intravenous Q12H Verdene Lennert, MD       Current Outpatient Medications  Medication Sig Dispense Refill   Vitamin D, Ergocalciferol, (DRISDOL) 1.25 MG (50000 UNIT) CAPS capsule Take 1 capsule (50,000 Units total) by mouth every 7 (seven) days. 7 capsule 0   baclofen (LIORESAL) 10 MG tablet Take 1 tablet (10 mg total) by mouth 3 (three) times daily as needed for muscle spasms. (Patient not taking: Reported on 11/23/2021) 30 each 0   butalbital-acetaminophen-caffeine (FIORICET) 50-325-40 MG tablet Take 1 tablet by mouth every 6 (six) hours as needed for headache. 30 tablet 3   cyanocobalamin 1000 MCG tablet Take 1 tablet (1,000 mcg total) by mouth daily. (Patient not taking: Reported on 04/11/2022) 30 tablet 0   folic acid (FOLVITE) 1 MG tablet Take 1 tablet (1 mg total) by mouth daily. (Patient not taking: Reported on 04/11/2022) 30 tablet 0   melatonin 3 MG TABS tablet Take 1 tablet (3 mg total) by mouth at bedtime as needed. (Patient not taking: Reported on 11/23/2021) 30 tablet 0   Multiple Vitamin (MULTIVITAMIN WITH MINERALS) TABS tablet Take 1 tablet by  mouth daily. (Patient not taking: Reported on 04/11/2022)     naltrexone (DEPADE) 50 MG tablet Take 0.5 tablets (25 mg total) by mouth every  other day. (Patient not taking: Reported on 04/11/2022) 90 tablet 3   nicotine (NICODERM CQ - DOSED IN MG/24 HOURS) 21 mg/24hr patch 21 mg patch daily x1 week then 14 mg patch daily x3 weeks then 7 mg patch daily x3 weeks and stop 28 patch 0   pantoprazole (PROTONIX) 40 MG tablet Take 1 tablet (40 mg total) by mouth at bedtime. (Patient not taking: Reported on 04/11/2022) 30 tablet 0   topiramate (TOPAMAX) 100 MG tablet Take 1 tablet (100 mg total) by mouth 2 (two) times daily. (Patient not taking: Reported on 04/11/2022) 60 tablet 0   traMADol (ULTRAM) 50 MG tablet Take 1 tablet (50 mg total) by mouth every 8 (eight) hours as needed for severe pain. (Patient not taking: Reported on 11/23/2021) 30 tablet 0   Ubrogepant (UBRELVY) 50 MG TABS Take 1 tablet by mouth 2 (two) times daily as needed. 16 tablet 3     Abtx:  Anti-infectives (From admission, onward)    Start     Dose/Rate Route Frequency Ordered Stop   04/11/22 2200  cefTRIAXone (ROCEPHIN) 2 g in sodium chloride 0.9 % 100 mL IVPB        2 g 200 mL/hr over 30 Minutes Intravenous Every 12 hours 04/11/22 1415 04/25/22 2159   04/11/22 1900  vancomycin (VANCOREADY) IVPB 500 mg/100 mL        500 mg 100 mL/hr over 60 Minutes Intravenous Every 12 hours 04/11/22 1415     04/11/22 1500  ampicillin (OMNIPEN) 2 g in sodium chloride 0.9 % 100 mL IVPB        2 g 300 mL/hr over 20 Minutes Intravenous Every 4 hours 04/11/22 1415 04/25/22 1459   04/11/22 1000  acyclovir (ZOVIRAX) 500 mg in dextrose 5 % 100 mL IVPB        10 mg/kg  50.1 kg (Ideal) 110 mL/hr over 60 Minutes Intravenous Every 8 hours 04/11/22 0856     04/11/22 0900  ampicillin (OMNIPEN) 2 g in sodium chloride 0.9 % 100 mL IVPB        2 g 300 mL/hr over 20 Minutes Intravenous  Once 04/11/22 0838 04/11/22 1015   04/11/22 0845  cefTRIAXone (ROCEPHIN) 2 g  in sodium chloride 0.9 % 100 mL IVPB        2 g 200 mL/hr over 30 Minutes Intravenous  Once 04/11/22 0838 04/11/22 0935   04/11/22 0845  vancomycin (VANCOCIN) IVPB 1000 mg/200 mL premix        1,000 mg 200 mL/hr over 60 Minutes Intravenous  Once 04/11/22 6063 04/11/22 1015       REVIEW OF SYSTEMS:  Const: subjective e fever, negative chills, negative weight loss Eyes: negative diplopia or visual changes, negative eye pain ENT: negative coryza, negative sore throat Resp: negative cough, hemoptysis, dyspnea Cards: negative for chest pain, palpitations, lower extremity edema GU: negative for frequency, dysuria and hematuria GI: Negative for abdominal pain, diarrhea, bleeding, constipation Skin:  rash and pruritus Heme: negative for easy bruising and gum/nose bleeding MS: some weakness Neurolo:left sided weakness and heaviness and stuttering speech  Psych:  anxiety,  Endocrine: negative for thyroid, diabetes Allergy/Immunology- as above Pt says she used todo cocaine and crystal meth but none since her stroke in Dec 2022 Objective:  VITALS:  BP 95/60   Pulse 99   Temp 98.2 F (36.8 C) (Oral)   Resp 20   Wt 51.3 kg   SpO2 94%   BMI 20.69 kg/m   PHYSICAL EXAM:  General: Alert, cooperative, no distress, appears stated age.oriented in place, person, time, eating Speech minimal stutter  Head: Normocephalic, without obvious abnormality, atraumatic. Eyes: some swelling eye lids rt > left ENT Nares normal. No drainage or sinus tenderness. Lips, mucosa, and tongue normal. No Thrush No teeth lower- upper denture Neck: Supple, symmetrical, no adenopathy, thyroid: non tender no carotid bruit and no JVD. Back: No CVA tenderness. Lungs: Clear to auscultation bilaterally. No Wheezing or Rhonchi. No rales. Heart: Regular rate and rhythm, no murmur, rub or gallop. Abdomen: Soft, non-tender,not distended. Bowel sounds normal. No masses Extremities: atraumatic, no cyanosis. No edema. No  clubbing Skin: fine excoriations over feet, hands , upper thighs and back       No petechiae Lymph: Cervical, supraclavicular normal. Neurologic: Grossly non-focal Pertinent Labs Lab Results CBC    Component Value Date/Time   WBC 7.5 04/11/2022 0813   RBC 5.36 (H) 04/11/2022 0813   HGB 14.3 04/11/2022 0813   HCT 44.7 04/11/2022 0813   PLT 347 04/11/2022 0813   MCV 83.4 04/11/2022 0813   MCH 26.7 04/11/2022 0813   MCHC 32.0 04/11/2022 0813   RDW 16.4 (H) 04/11/2022 0813   LYMPHSABS 1.1 04/11/2022 0813   MONOABS 0.8 04/11/2022 0813   EOSABS 0.6 (H) 04/11/2022 0813   BASOSABS 0.0 04/11/2022 0813       Latest Ref Rng & Units 04/11/2022    8:13 AM 10/04/2021    5:56 AM 10/04/2021    5:37 AM  CMP  Glucose 70 - 99 mg/dL 95  604106  [id number]   BUN 8 - 23 mg/dL 17  23  19    Creatinine 0.44 - 1.00 mg/dL 9.810.55  1.910.40  4.780.50   Sodium 135 - 145 mmol/L 141  142  141   Potassium 3.5 - 5.1 mmol/L 4.0  3.9  3.7   Chloride 98 - 111 mmol/L 110  109  112   CO2 22 - 32 mmol/L 25   23   Calcium 8.9 - 10.3 mg/dL 9.2   8.9   Total Protein 6.5 - 8.1 g/dL 7.1   5.9   Total Bilirubin 0.3 - 1.2 mg/dL 0.5   0.3   Alkaline Phos 38 - 126 U/L 146   95   AST 15 - 41 U/L 35   22   ALT 0 - 44 U/L 39   18     Latest Reference Range & Units 04/11/22 12:14  Appearance, CSF CLEAR  CLEAR  CLEAR HAZY !  Glucose, CSF 40 - 70 mg/dL 60  RBC Count, CSF 0 - 3 /cu mm 0 - 3 /cu mm 37 (H) 2,380 (H)  WBC, CSF 0 - 5 /cu mm 0 - 5 /cu mm 3 49 (HH)  Segmented Neutrophils-CSF % % 17 71  Lymphs, CSF % % 66 21  Monocyte-Macrophage-Spinal Fluid % % 17 8  Eosinophils, CSF % % 0 0  Color, CSF COLORLESS  COLORLESS  COLORLESS COLORLESS  Supernatant  CLEAR CLEAR  Total  Protein, CSF 15 - 45 mg/dL 17  Tube #  3 (C) 1     Microbiology: Recent Results (from the past 240 hour(s))  CSF culture w Gram Stain     Status: None (Preliminary result)   Collection Time: 04/11/22 12:14 PM   Specimen: CSF; Cerebrospinal  Fluid  Result Value Ref Range Status   Specimen Description CSF  Final   Special Requests NONE  Final   Gram Stain   Final  NO ORGANISMS SEEN RED BLOOD CELLS WBC SEEN Performed at [facility], [address] [place], [place] [address]    Culture PENDING  Incomplete   Report Status PENDING  Incomplete  SARS Coronavirus 2 by RT PCR (hospital order, performed in [facility] hospital lab) *cepheid single result test* Anterior Nasal Swab     Status: None   Collection Time: 04/11/22 12:14 PM   Specimen: Anterior Nasal Swab  Result Value Ref Range Status   SARS Coronavirus 2 by RT PCR NEGATIVE NEGATIVE Final    Comment: (NOTE) SARS-CoV-2 target nucleic acids are NOT DETECTED.  The SARS-CoV-2 RNA is generally detectable in upper and lower respiratory specimens during the acute phase of infection. The lowest concentration of SARS-CoV-2 viral copies this assay can detect is 250 copies / mL. A negative result does not preclude SARS-CoV-2 infection and should not be used as the sole basis for treatment or other patient management decisions.  A negative result may occur with improper specimen collection / handling, submission of specimen other than nasopharyngeal swab, presence of viral mutation(s) within the areas targeted by this assay, and inadequate number of viral copies (<250 copies / mL). A negative result must be combined with clinical observations, patient history, and epidemiological information.  Fact Sheet for Patients:   RoadLapTop.co.za  Fact Sheet for Healthcare Providers: http://kim-miller.com/  This test is not yet approved or  cleared by the [place] FDA and has been authorized for detection and/or diagnosis of SARS-CoV-2 by FDA under an Emergency Use Authorization (EUA).  This EUA will remain in effect (meaning this test can be used) for the duration of the COVID-19 declaration under Section 564(b)(1) of  the Act, 21 U.S.C. section 360bbb-3(b)(1), unless the authorization is terminated or revoked sooner.  Performed at [facility], [address] [place], [place] [address]     IMAGING RESULTS:  CT head  I have personally reviewed the films ?Rt temporoparietal encephalomalacia   Impression/Recommendation 61 yr female with h/o rt thalamic/basal ganglia hemorrhage /infarct in Dec 2022 with residual left leg weakness needing PRAFO brace presented with sudden onset left sided heaviness and weakness Also has itching and scratching for 3 weeks with excoriations  LP was done as the rash was thought to be due to meningitis Traumatic LP- tube 1 had rbc> 2K and wbc 49 and tube 3 - has only 3 wbc and 37 RBC. Also N protein and glucose  Clinically no evidence of meningitis or encephalitis Pruritus and the rash due to excoriation and not petechiae could be a drug allergy as she says it started after fioricet was changed to generic on 03/08/22 Eye swelling  could be from allery as well  DC acyclovir, and other antibiotics except ceftriaxone which can be changed to non meningitis dose as she had subjective fever at home   . 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 ?__________________________________________________ Discussed the management with patient,and  requesting provider and neurologist Dr.Wallace is covering tomorrow- call if needed  Note:  This document was prepared using Dragon voice recognition software and may include unintentional dictation errors.

## 2022-04-11 NOTE — Progress Notes (Addendum)
Pharmacy Antibiotic Note  Monica Miranda is a 61 y.o. 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  Pharmacy has been consulted for acyclovir and vancomycin dosing.  Acyclovir 10 mg/kg every 8 hours (IBW)  Ordered with NS @75  mL/hr >> transitioned LR @150mL /hr Vancomycin 1,000 mg x1; followed by: Vancomycin 500mg  IV q12h Goal trough of 15-20  Estimated Cmax: 29.5; Cmin: 16.2 SCr 0.8 (actual 0.55); IBW/TBW; Vd 0.72  Continues ampicillin 2 g IV q4h   Continues on ceftriaxone 2 grams x1 followed by ceftriaxone 2g IV q12h  Follow up renal function, neuro plans, and clinical picture.   Weight: 51.3 kg (113 lb 1.5 oz)  Temp (24hrs), Avg:98 F (36.7 C), Min:97.7 F (36.5 C), Max:98.2 F (36.8 C)  Recent Labs  Lab 04/11/22 0813 04/11/22 0847 04/11/22 1214  WBC 7.5  --   --   CREATININE 0.55  --   --   LATICACIDVEN  --  1.1 2.1*     Estimated Creatinine Clearance: 57.7 mL/min (by C-G formula based on SCr of 0.55 mg/dL).    Allergies  Allergen Reactions   Codeine Rash    Antimicrobials this admission: Acyclovir 11/6 >> CRO/VAN/AMP (11/6>>  Dose adjustments this admission: N/a  Microbiology results: 11/6 UCx: ordered  11/6 BCx: ordered 11/6 CSF from LP with gram stain: ordered  Thank you for allowing pharmacy to be a part of this patient's care.  Shanon Brow Armstead Heiland 04/11/2022 2:23 PM

## 2022-04-11 NOTE — ED Notes (Addendum)
Neuro in CT 

## 2022-04-11 NOTE — Code Documentation (Signed)
Stroke Response Nurse Documentation Code Documentation  Monica Miranda is a 61 y.o. female arriving to [facility] via [facility] on 04/11/2022 with past medical hx of COPD, previous hemorrhagic stroke. On No antithrombotic. Code stroke was activated by ED.   Patient from home where she was LKW at 04/10/2022 at 2200 and now complaining of left sided weakness, left sided chest pain, and rash/itchiness. Patient noticed when going to bed last night that her left upper and lower extremity were "dead weight." Upon waking, patient still had left sided upper and lower extremity weakness and was brought to the ED for evaluation.   Stroke team met patient in CT after code stroke called. Patient to CT with team. NIHSS 8, see documentation for details and code stroke times. Patient with left gaze preference , left arm weakness, left leg weakness, left decreased sensation, and Expressive aphasia  on exam. The following imaging was completed:  CT Head. Patient is not a candidate for IV Thrombolytic due to outside window, per MD. Patient is not a candidate for IR due to LVO not suspected.   Care Plan: Q2h NIHSS + VS   Bedside handoff with ED RN Lyndee Hensen  Stroke Response RN

## 2022-04-11 NOTE — ED Notes (Signed)
FAMILY INFORMATION: [place] [phone]

## 2022-04-11 NOTE — Progress Notes (Addendum)
Pharmacy Antibiotic Note  Monica Miranda is a 61 y.o. 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  Pharmacy has been consulted for acyclovir dosing.  Ceftriaxone 2 grams x1, vancomycin 1,000 mg x 1, ampicillin 2 grams x1 ordered by medical team.   Plan: Acyclovir 10 mg/kg every 8 hours (IBW)   Ordered normal saline @75  mL/hr   Follow up renal function, neuro plans, and clinical picture.   Weight: 51.3 kg (113 lb 1.5 oz)  Temp (24hrs), Avg:97.9 F (36.6 C), Min:97.7 F (36.5 C), Max:98 F (36.7 C)  Recent Labs  Lab 04/11/22 0813  WBC 7.5  CREATININE 0.55    Estimated Creatinine Clearance: 57.7 mL/min (by C-G formula based on SCr of 0.55 mg/dL).    Allergies  Allergen Reactions   Codeine Rash    Antimicrobials this admission: Acyclovir 11/6 >> Ceftriaxone x 1 Vancomycin x 1 Ampicillin x 1  Dose adjustments this admission: N/a  Microbiology results: 11/6 UCx: ordered  11/6 CSF from LP with gram stain: ordered  Thank you for allowing pharmacy to be a part of this patient's care.  Wynelle Cleveland 04/11/2022 8:51 AM

## 2022-04-11 NOTE — ED Notes (Signed)
To room from CT. EDP and neuro stroke team at [facility]. Code stroke cancelled. Concern for meningitis. Precautions initiated. GCS 15.

## 2022-04-11 NOTE — Progress Notes (Signed)
Per patient's request attempted to call her son Gerald Stabs at [phone] but there was no answer.  Su Monks, MD [facility] [phone]  If 7pm- 7am, please page neurology on call as listed in [place].

## 2022-04-11 NOTE — Assessment & Plan Note (Addendum)
Presented with acute on chronic worsening of left upper and lower extremity weakness that started at approximately 4 AM on 11/6.  On examination, there is left upper and lower extremity weakness, but noted to be effort dependent.  CT head negative. LP obtained to rule out encephalitis/meningitis.   Neurology recommended ID consultation.  Per pathologist smear review, likely peripheral blood contamination of CSF fluid. Differential for left-sided weakness also includes Todd's paralysis given onset of headache around time of symptom onset.  Appears this has returned to her baseline. - Neurology and ID consulted; both have signed off -- Antibiotics stopped --No further neurologic work up --Outpatient neurology follow up - Treated initially with empiric ceftriaxone, vancomycin, ampicillin, and azithromycin.  - Follow CSF and blood cultures - Continue home migraine medicine including Fioricet and [name]

## 2022-04-12 ENCOUNTER — Observation Stay: Payer: Medicaid Other

## 2022-04-12 DIAGNOSIS — G039 Meningitis, unspecified: Secondary | ICD-10-CM

## 2022-04-12 DIAGNOSIS — E872 Acidosis, unspecified: Secondary | ICD-10-CM | POA: Diagnosis not present

## 2022-04-12 DIAGNOSIS — L299 Pruritus, unspecified: Secondary | ICD-10-CM | POA: Diagnosis not present

## 2022-04-12 DIAGNOSIS — R519 Headache, unspecified: Secondary | ICD-10-CM | POA: Diagnosis present

## 2022-04-12 DIAGNOSIS — R531 Weakness: Secondary | ICD-10-CM | POA: Diagnosis not present

## 2022-04-12 LAB — RESPIRATORY PANEL BY PCR

## 2022-04-12 LAB — COMPREHENSIVE METABOLIC PANEL
ALT: 48 U/L — ABNORMAL HIGH (ref 0–44)
AST: 36 U/L (ref 15–41)
Albumin: 2.9 g/dL — ABNORMAL LOW (ref 3.5–5.0)
Alkaline Phosphatase: 112 U/L (ref 38–126)
Anion gap: 3 — ABNORMAL LOW (ref 5–15)
BUN: 14 mg/dL (ref 8–23)
CO2: 25 mmol/L (ref 22–32)
Calcium: 8.5 mg/dL — ABNORMAL LOW (ref 8.9–10.3)
Chloride: 116 mmol/L — ABNORMAL HIGH (ref 98–111)
Creatinine, Ser: 0.55 mg/dL (ref 0.44–1.00)
GFR, Estimated: >60 mL/min (ref >60)
Glucose, Bld: 118 mg/dL — ABNORMAL HIGH (ref 70–99)
Potassium: 3.4 mmol/L — ABNORMAL LOW (ref 3.5–5.1)
Sodium: 144 mmol/L (ref 135–145)
Total Bilirubin: 0.4 mg/dL (ref 0.3–1.2)
Total Protein: 5.3 g/dL — ABNORMAL LOW (ref 6.5–8.1)

## 2022-04-12 LAB — CBC
HCT: 33.8 % — ABNORMAL LOW (ref 36.0–46.0)
Hemoglobin: 10.8 g/dL — ABNORMAL LOW (ref 12.0–15.0)
MCH: 26.6 pg (ref 26.0–34.0)
MCHC: 32 g/dL (ref 30.0–36.0)
MCV: 83.3 fL (ref 80.0–100.0)
Platelets: 270 10*3/uL (ref 150–400)
RBC: 4.06 MIL/uL (ref 3.87–5.11)
RDW: 16.4 % — ABNORMAL HIGH (ref 11.5–15.5)
WBC: 8.3 10*3/uL (ref 4.0–10.5)
nRBC: 0 % (ref 0.0–0.2)

## 2022-04-12 LAB — LACTIC ACID, PLASMA: Lactic Acid, Venous: 2 mmol/L (ref 0.5–1.9)

## 2022-04-12 LAB — T4, FREE: Free T4: 0.77 ng/dL (ref 0.61–1.12)

## 2022-04-12 LAB — URINE CULTURE: Culture: NO GROWTH

## 2022-04-12 LAB — ROCKY MTN SPOTTED FVR ABS PNL(IGG+IGM)
RMSF IgG: NEGATIVE
RMSF IgM: 0.64 index (ref 0.00–0.89)

## 2022-04-12 MED ORDER — NAPHAZOLINE-PHENIRAMINE 0.025-0.3 % OP SOLN
2.0000 [drp] | Freq: Four times a day (QID) | OPHTHALMIC | Status: DC | PRN
  Administered 2022-04-12 – 2022-04-13 (×3): 2 [drp] via OPHTHALMIC
  Filled 2022-04-12: qty 5

## 2022-04-12 MED ORDER — LACTATED RINGERS IV SOLN: INTRAVENOUS | Status: DC

## 2022-04-12 MED ORDER — POTASSIUM CHLORIDE CRYS ER 20 MEQ PO TBCR
40.0000 meq | EXTENDED_RELEASE_TABLET | Freq: Once | ORAL | Status: AC
  Administered 2022-04-12: 40 meq via ORAL
  Filled 2022-04-12: qty 2

## 2022-04-12 NOTE — Assessment & Plan Note (Signed)
Continue home regimen - Fioricet and Ubelvry

## 2022-04-12 NOTE — Assessment & Plan Note (Signed)
At baseline 

## 2022-04-12 NOTE — Evaluation (Signed)
Physical Therapy Evaluation Patient Details Name: Monica Miranda MRN: [id number] DOB: [birth year] Today's Date: 04/12/2022  History of Present Illness  presented to ER secondary to acute onset of L UE/LE heaviness/weakness, facial swelling and generalized itching; admitted for TIA/CVA vs CNS infection work up.  All imaging negative for acute insult; CNS infectious work up unremarkable to date.  Clinical Impression  Patient resting in bed upon arrival to room; alert and oriented, follows commands and generally eager for participation with therapy session.  Denies pain at rest. Mild L UE/LE residual weakness noted from previous neurological event (R BG hemorrhage/infarct); unchanged with this admission (initial presenting symptoms have improved).  Able to complete bed mobility with mod indep; sit/stand, basic transfers and gait (150') with RW, cga/min assist.  Demonstrates partially reciprocal versus step to gait pattern, leading with L LE.  Very intentional/effortful advancement of L LE, but able to clear without significant difficulty (despite no AFO).  Increased sway to L > R throughout gait cycle, but no overt buckling or LOB.  Distance limited by onset of L LE fatigue/pain, necessitating sit for rest period. Would benefit from skilled PT to address above deficits and promote optimal return to PLOF.; Recommend transition to HHPT upon discharge from acute hospitalization.      Recommendations for follow up therapy are one component of a multi-disciplinary discharge planning process, led by the attending physician.  Recommendations may be updated based on patient status, additional functional criteria and insurance authorization.  Follow Up Recommendations Home health PT      Assistance Recommended at Discharge PRN  Patient can return home with the following  A little help with walking and/or transfers;A little help with bathing/dressing/bathroom    Equipment Recommendations Rolling walker (2  wheels)  Recommendations for Other Services       Functional Status Assessment Patient has had a recent decline in their functional status and demonstrates the ability to make significant improvements in function in a reasonable and predictable amount of time.     Precautions / Restrictions Precautions Precautions: Fall Restrictions Weight Bearing Restrictions: No      Mobility  Bed Mobility Overal bed mobility: Needs Assistance Bed Mobility: Supine to Sit     Supine to sit: Supervision          Transfers Overall transfer level: Needs assistance Equipment used: Rolling walker (2 wheels) Transfers: Sit to/from Stand Sit to Stand: Min guard, Min assist           General transfer comment: pulls on RW with UEs    Ambulation/Gait Ambulation/Gait assistance: Min guard, Min assist Gait Distance (Feet): 150 Feet Assistive device: None (holding rail/nursing station (declined RW))         General Gait Details: partially reciprocal versus step to gait pattern, leading with L LE.  Very intentional/effortful advancement of L LE, but able to clear without significant difficulty (despite no AFO).  Increased sway to L > R throughout gait cycle, but no overt buckling or LOB.  Distance limited by onset of L LE fatigue/pain, necessitating sit for rest period  Stairs            Wheelchair Mobility    Modified Rankin (Stroke Patients Only)       Balance Overall balance assessment: Needs assistance Sitting-balance support: No upper extremity supported, Feet supported Sitting balance-Leahy Scale: Good     Standing balance support: Bilateral upper extremity supported Standing balance-Leahy Scale: Fair  Pertinent Vitals/Pain Pain Assessment Pain Assessment: Faces Faces Pain Scale: Hurts little more Pain Location: L LE Pain Descriptors / Indicators: Aching Pain Intervention(s): Limited activity within patient's tolerance,  Monitored during session, Repositioned    Home Living Family/patient expects to be discharged to:: Private residence Living Arrangements: Children Available Help at Discharge: Family;Available 24 hours/day Type of Home: House Home Access: Stairs to enter Entrance Stairs-Rails: Doctor, general practice of Steps: 2   Home Layout: One level Home Equipment: Cane - single point (L AFO)      Prior Function Prior Level of Function : Needs assist             Mobility Comments: Close sup for ADLs, household and limited community mobility; tends to 'furniture cruise' in home environment, use SPC (or hold family's arm) outside of home.       Hand Dominance   Dominant Hand: Right    Extremity/Trunk Assessment   Upper Extremity Assessment Upper Extremity Assessment:  (L UE grossly 4-/5, baseline for patient)    Lower Extremity Assessment Lower Extremity Assessment:  (L LE grossly 3+ to 4-/5 throughout (except L ankle DF 3-/5), baseline for patient.  Isolated MMT and functional strength/use somewhat inconsistent)       Communication   Communication: No difficulties  Cognition Arousal/Alertness: Awake/alert Behavior During Therapy: WFL for tasks assessed/performed Overall Cognitive Status: Within Functional Limits for tasks assessed                                 General Comments: Generally alert and oriented to basic information; follows commands; very motivated for participation and progress with therapy.  Slightly impulsive at times.  Intermittently focused/almost perseverative on health conditions/medical history.        General Comments      Exercises     Assessment/Plan    PT Assessment Patient needs continued PT services  PT Problem List Decreased strength;Decreased activity tolerance;Decreased balance;Decreased mobility;Decreased knowledge of use of DME;Decreased safety awareness;Decreased knowledge of precautions;Pain       PT  Treatment Interventions DME instruction;Gait training;Stair training;Functional mobility training;Therapeutic activities;Therapeutic exercise;Balance training;Patient/family education    PT Goals (Current goals can be found in the Care Plan section)  Acute Rehab PT Goals Patient Stated Goal: to return home PT Goal Formulation: With patient Time For Goal Achievement: 04/26/22 Potential to Achieve Goals: Good    Frequency Min 2X/week     Co-evaluation               AM-PAC PT "6 Clicks" Mobility  Outcome Measure Help needed turning from your back to your side while in a flat bed without using bedrails?: None Help needed moving from lying on your back to sitting on the side of a flat bed without using bedrails?: None Help needed moving to and from a bed to a chair (including a wheelchair)?: A Little Help needed standing up from a chair using your arms (e.g., wheelchair or bedside chair)?: A Little Help needed to walk in hospital room?: A Little Help needed climbing 3-5 steps with a railing? : A Little 6 Click Score: 20    End of Session Equipment Utilized During Treatment: Gait belt Activity Tolerance: Patient tolerated treatment well Patient left: in chair;with call bell/phone within reach;with chair alarm set Nurse Communication: Mobility status PT Visit Diagnosis: Difficulty in walking, not elsewhere classified (R26.2);Muscle weakness (generalized) (M62.81)    Time: 0175-1025 PT Time Calculation (min) (ACUTE  ONLY): 20 min   Charges:   PT Evaluation $PT Eval Moderate Complexity: 1 Mod          Kelson Queenan H. Owens Shark, PT, DPT, NCS 04/12/22, 4:35 PM [phone]

## 2022-04-12 NOTE — Progress Notes (Signed)
Monica Miranda for Infectious Disease  Date of Admission:  04/11/2022           Reason for visit: Follow up on concern for meningitis   ASSESSMENT:    61 y.o. female admitted with:  Clinically there is no evidence of meningitis or encephalitis.  She remains afebrile, no WBC, cultures negative.  Antibiotics de-escalated yesterday to ceftriaxone monotherapy at non-meningitis dosing. Pruritus and and rash of unclear etiology but non-infectious.  RECOMMENDATIONS:    Stop antibiotics Will sign off please call as needed.   Principal Problem:   Left-sided weakness Active Problems:   ICH (intracerebral hemorrhage) (HCC)   Generalized pruritus   Eye swelling, bilateral   COPD (chronic obstructive pulmonary disease) (HCC)   Slurred speech    MEDICATIONS:    Scheduled Meds:  enoxaparin (LOVENOX) injection  40 mg Subcutaneous E31V   folic acid  1 mg Oral Daily   multivitamin with minerals  1 tablet Oral Daily   sodium chloride flush  3 mL Intravenous Q12H   thiamine  100 mg Oral Daily   Continuous Infusions:  lactated ringers 75 mL/hr at 04/12/22 1159   PRN Meds:.acetaminophen **OR** acetaminophen, butalbital-acetaminophen-caffeine, diphenhydrAMINE, naphazoline-pheniramine, ondansetron **OR** ondansetron (ZOFRAN) IV, polyethylene glycol  SUBJECTIVE:   24 hour events:  No events  No new complaints right now other than ongoing itching.  She has no fevers, HA, meningeal symptoms.  Review of Systems  All other systems reviewed and are negative.     OBJECTIVE:   Blood pressure (!) 144/74, pulse 65, temperature 97.6 F (36.4 C), resp. rate 16, height 5' (1.524 m), weight 52.6 kg, SpO2 96 %. Body mass index is 22.65 kg/m.  Physical Exam Constitutional:      Appearance: Normal appearance. She is not toxic-appearing.  HENT:     Head: Normocephalic and atraumatic.  Eyes:     Extraocular Movements: Extraocular movements intact.     Conjunctiva/sclera:  Conjunctivae normal.  Pulmonary:     Effort: Pulmonary effort is normal. No respiratory distress.  Abdominal:     General: There is no distension.     Palpations: Abdomen is soft.  Musculoskeletal:     Right lower leg: No edema.     Left lower leg: No edema.  Skin:    General: Skin is warm and dry.  Neurological:     General: No focal deficit present.     Mental Status: She is alert and oriented to person, place, and time.  Psychiatric:        Mood and Affect: Mood normal.        Behavior: Behavior normal.      Lab Results: Lab Results  Component Value Date   WBC 8.3 04/12/2022   HGB 10.8 (L) 04/12/2022   HCT 33.8 (L) 04/12/2022   MCV 83.3 04/12/2022   PLT 270 04/12/2022    Lab Results  Component Value Date   NA 144 04/12/2022   K 3.4 (L) 04/12/2022   CO2 25 04/12/2022   GLUCOSE 118 (H) 04/12/2022   BUN 14 04/12/2022   CREATININE 0.55 04/12/2022   CALCIUM 8.5 (L) 04/12/2022   GFRNONAA >60 04/12/2022   GFRAA >90 09/16/2013    Lab Results  Component Value Date   ALT 48 (H) 04/12/2022   AST 36 04/12/2022   ALKPHOS 112 04/12/2022   BILITOT 0.4 04/12/2022    No results found for: "CRP"  No results found for: "ESRSEDRATE"   I have reviewed the  micro and lab results in Epic.  Imaging: US Abdomen Limited RUQ (LIVER/GB)  Result Date: 04/12/2022 CLINICAL DATA:  Elevated alkaline phosphatase EXAM: ULTRASOUND ABDOMEN LIMITED RIGHT UPPER QUADRANT COMPARISON:  None Available. FINDINGS: Gallbladder: Cholelithiasis. The gallbladder is nondistended. A large shadowing echogenic stone at the gallbladder neck appears nonmobile on real time imaging. No sonographic Murphy sign noted by sonographer. Common bile duct: Diameter: 4 mm Liver: No focal lesion identified. Within normal limits in parenchymal echogenicity. Portal vein is patent on color Doppler imaging with normal direction of blood flow towards the liver. Other: None. IMPRESSION: Cholelithiasis without sonographic  findings of acute cholecystitis. Electronically Signed   By: Agustin Cree M.D.   On: 04/12/2022 10:46   DG CHEST PORT 1 VIEW  Result Date: 04/11/2022 CLINICAL DATA:  Stroke.  Weakness, eye swelling, rash. EXAM: PORTABLE CHEST 1 VIEW COMPARISON:  05/25/2021 FINDINGS: Lungs are clear.  No pleural effusion or pneumothorax. The heart is normal in size. Moderate hiatal hernia. IMPRESSION: No evidence of acute cardiopulmonary disease. Electronically Signed   By: Charline Bills M.D.   On: 04/11/2022 20:26   MR BRAIN W WO CONTRAST  Result Date: 04/11/2022 CLINICAL DATA:  Left-sided weakness; prior right temporoparietal hemorrhagic infarct EXAM: MRI HEAD WITHOUT AND WITH CONTRAST TECHNIQUE: Multiplanar, multiecho pulse sequences of the brain and surrounding structures were obtained without and with intravenous contrast. CONTRAST:  74mL GADAVIST GADOBUTROL 1 MMOL/ML IV SOLN COMPARISON:  10/04/2021 FINDINGS: Brain: Redemonstrated areas of restricted diffusion about the encephalomalacia and gliosis related to the prior right temporoparietal hemorrhage, some of which corresponds with areas hemosiderin deposition. No additional restricted diffusion to suggest acute or subacute infarct. No abnormal parenchymal or meningeal enhancement. No acute hemorrhage, mass, mass effect, midline shift. No hydrocephalus or extra-axial collection. Ex vacuo dilatation of the right occipital and temporal horns. Scattered T2 hyperintense signal in the periventricular white matter, likely the sequela of chronic small vessel ischemic disease. Redemonstrated mega cisterna magna. Vascular: Normal arterial flow voids. Normal arterial and venous enhancement. Skull and upper cervical spine: Normal marrow signal. Sinuses/Orbits: Clear paranasal sinuses. The orbits are unremarkable. Other: The mastoids are well aerated. IMPRESSION: No acute intracranial process. No evidence of acute or subacute infarct. Electronically Signed   By: Wiliam Ke M.D.    On: 04/11/2022 20:09   CT HEAD CODE STROKE WO CONTRAST  Result Date: 04/11/2022 CLINICAL DATA:  Code stroke. Neuro deficit, acute, stroke suspected. Left-sided weakness. EXAM: CT HEAD WITHOUT CONTRAST TECHNIQUE: Contiguous axial images were obtained from the base of the skull through the vertex without intravenous contrast. RADIATION DOSE REDUCTION: This exam was performed according to the departmental dose-optimization program which includes automated exposure control, adjustment of the mA and/or kV according to patient size and/or use of iterative reconstruction technique. COMPARISON:  Head CT and MRI 10/04/2021 FINDINGS: Brain: There is an unchanged, moderately large region of encephalomalacia involving the right temporal and parietal lobes at the site of a prior hemorrhage with associated ex vacuo dilatation of the right lateral ventricle. There is no evidence of an acute infarct, acute intracranial hemorrhage, mass, midline shift, or extra-axial fluid collection. 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 Vascular: Calcified atherosclerosis at the skull base. No hyperdense vessel. Skull: No acute fracture or suspicious osseous lesion. Sinuses/Orbits: Visualized paranasal sinuses and mastoid air cells are clear. Unremarkable orbits. Other: None. ASPECTS [facility] Stroke Program Early CT Score) Not scored given the presence of encephalomalacia involving much of the right MCA territory. These  results were communicated to Dr. Selina Cooley at 8:34 am on 04/11/2022 by text page via the [facility] messaging system. IMPRESSION: 1. No evidence of acute intracranial abnormality. 2. Right temporoparietal encephalomalacia. Electronically Signed   By: Sebastian Ache M.D.   On: 04/11/2022 08:34     Imaging independently reviewed in Epic.    Vedia Coffer for Infectious Disease [facility] [phone] pager 04/12/2022, 1:29 PM  I have personally spent 50 minutes involved in  face-to-face and non-face-to-face activities for this patient on the day of the visit. Professional time spent includes the following activities: Preparing to see the patient (review of tests), Obtaining and/or reviewing separately obtained history (admission/discharge record), Performing a medically appropriate examination and/or evaluation , Ordering medications/tests/procedures, referring and communicating with other health care professionals, Documenting clinical information in the EMR, Independently interpreting results (not separately reported), Communicating results to the patient/family/caregiver, Counseling and educating the patient/family/caregiver and Care coordination (not separately reported).

## 2022-04-12 NOTE — Progress Notes (Signed)
Progress Note   Patient: Monica Miranda VOH:[id number] DOB: 10/12/1960 DOA: 04/11/2022     0 DOS: the patient was seen and examined on 04/12/2022   Brief hospital course: Monica Miranda is a 61 y.o. female with medical history significant of ICH (right thalamic/basal ganglia/frontotemporal, December 2020), polysubstance abuse including cocaine and amphetamine, COPD, hypertension, hyperlipidemia who presented to the ED on 04/11/2022 for evaluation of left-sided weakness, facial swelling and generalized pruritus for approximately 3 weeks, now worse on her bilateral palms and feet, progressively worsening.  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  Unsure but suspected onset of pruritus around same time.    She arrived to ED as a code stroke and neurology was consulted.  CT head was negative and code stroke was cancelled.  There was some concern for possible meningitis due to "rash" which was scabbed areas of excoriations from itching.  LP results were reassuring.  She was started on empiric antibiotics for this versus UTI with consultation of Infectious disease.  Antibiotics stopped 11/7 given unremarkable infectious work up, no fevers or leukocytosis.    11/7 - having ongoing diffuse pruritus, worst over her head, neck and soles of feet.  No hives or rashes present.   Assessment and Plan: * Left-sided weakness Presented with acute on chronic worsening of left upper and lower extremity weakness that started at approximately 4 AM on 11/6.  On examination, there is left upper and lower extremity weakness, but noted to be effort dependent.  CT head negative. LP obtained to rule out encephalitis/meningitis.   Neurology recommended ID consultation.  Per pathologist smear review, likely peripheral blood contamination of CSF fluid. Differential for left-sided weakness also includes  Todd's paralysis given onset of headache around time of symptom onset.  Appears this has returned to her baseline. - Neurology and ID consulted; both have signed off -- Antibiotics stopped --No further neurologic work up --Outpatient neurology follow up - Treated initially with empiric ceftriaxone, vancomycin, ampicillin, and azithromycin.  - Follow CSF and blood cultures - Continue home migraine medicine including Fioricet and Ubrelvy  Eye swelling, bilateral Presented with bilateral eye swelling and redness that is improved after IV Solu-Medrol and Benadryl.  Admitting hospitalist discussed with ED provider neurology, who noted that patient was persistently rubbing her eyes for prolonged period of time during their evaluation.  3 week hx of generalized pruritus with diffuse excoriations on examination, it is possible swelling is reactive to physical rubbing.  No vision changes noted and normal conjunctival on examination. -Continue Benadryl every 6 hours as needed --Allergy eye drops ordered  Pruritus Presented with 3-week history of generalized pruritus that she states is worse on her hands and feet.  On examination, she has diffuse excoriations throughout, but particularly worse on her bottom.  No petechiae, rash or hives seen.   She thinks onset of this MAY correlate with filling her Fioricet Rx and being given generic pills when she usually gets the brand name medication. Low suspicion for contact dermatitis given widespread presence.  Inconsistent with scabies or bedbugs.  No uremia on CMP. She does have elevated alkaline phosphatase.  Differential also includes neuropathic pruritus.  RUQ U/S with stones but no acute cholecystitis or evidence of biliary obstruction. - Continue Benadryl every 6 hours as needed - Repeat CMP in the a.m.  ICH (intracerebral hemorrhage) (HCC) Stable.  CT head with no evidence  of new hemorrhage. Neurology has signed off. Outpatient neurology follow  up.  COPD (chronic obstructive pulmonary disease) (HCC) Stable, not exacerbated - Albuterol as needed  Lactic acidosis Initial lactate 1.1 >> 2.1 on repeat >> 2.0 this AM. --Start IV fluids --No evidence of infection --ID stopped antibiotics --Repeat lactate in AM  Chronic headaches Continue home regimen - Fioricet and Ubelvry  Meningitis RULED OUT  Slurred speech At baseline.          Subjective: Pt seen awake sitting up in bed, very restless with diffuse itching.  She says it's worse all over her head and neck, and soles of feet.  No signs of any rash or hives.  She says benadryl does not relieve itch, but makes her too sleepy to stay awake from itching.  She cannot provide other history or details of symptoms due to hyper-focusing on itching.  She does deny fever or chills.   Physical Exam: Vitals:   04/12/22 0827 04/12/22 1150 04/12/22 1219 04/12/22 1615  BP: 107/69 139/87 (!) 144/74 119/78  Pulse: (!) 58 65 65 69  Resp: 17 19 16 15   Temp: 98.2 F (36.8 C) 98 F (36.7 C) 97.6 F (36.4 C) 99.6 F (37.6 C)  TempSrc: Oral     SpO2: 97% 98% 96% 94%  Weight:      Height:       General exam: awake, alert, restless with itching head and feet but no acute distress, underweight, chronically ill appearing HEENT: moist mucus membranes, hearing grossly normal  Respiratory system: CTAB no wheezes, rales or rhonchi, normal respiratory effort. Cardiovascular system: normal S1/S2, RRR, no JVD, murmurs, rubs, gallops, no pedal edema.   Gastrointestinal system: soft, NT, ND, no HSM felt, +bowel sounds. Central nervous system: A&O x4. no gross focal neurologic deficits, normal speech Extremities: moves all, no edema, normal tone Skin: dry, intact, normal temperature, normal color, No rashes, lesions or ulcers, scattered small excoriations on LE's and dorsal foot but no hives, macules, papules or other sign of rashes.  Psychiatry: normal mood, congruent affect, judgement and  insight appear normal   Data Reviewed:  Notable labs --- lactic acid 2.1 >> 2.0, Hbg 10.8, no leukocytosis, normal T4 despite suppressed TSH, K 3.4, Cl 116, ghlucose 118, Ca 8.5, albumjin 2.9, ALT 48  Family Communication: None at bedside, will attempt to call  Disposition: Status is: Observation  The patient remains OBS appropriate and will d/c before 2 midnights. Remains in hospital on IV fluids due to lactic acidosis of unclear etiology. Ongoing evaluation.    Planned Discharge Destination: Home    Time spent: 45 minutes  Author: Ezekiel Slocumb, DO 04/12/2022 4:39 PM  For on call review [email or web address].

## 2022-04-12 NOTE — Assessment & Plan Note (Signed)
RULED OUT

## 2022-04-12 NOTE — Assessment & Plan Note (Signed)
Initial lactate 1.1 >> 2.1 on repeat >> 2.0 this AM. --Start IV fluids --No evidence of infection --ID stopped antibiotics --Repeat lactate in AM

## 2022-04-12 NOTE — Progress Notes (Addendum)
   Mobility Specialist - Progress Note       04/12/22 1700  Mobility  Activity Ambulated with assistance in hallway;Stood at bedside  Level of Assistance Contact guard assist, steadying assist  Assistive Device Front wheel walker  Distance Ambulated (ft) 160 ft  Activity Response Tolerated well  Mobility Referral Yes  $Mobility charge 1 Mobility    Pt resting in bed on RA upon entry. Pt STS and ambulates in hallway CGA. Pt used RW for 159ft and no AD for remaining 45ft of session (per patient insistence).  Pt given VC to slow pace and widen gait to avoid falling.  Pt repeated her own VC's to self correct. Pt returned to chair; left with needs in reach and chair alarm activated.       Johnathan Hausen Mobility Specialist 04/12/22, 5:38 PM

## 2022-04-12 NOTE — Hospital Course (Signed)
Monica Miranda is a 61 y.o. female with medical history significant of ICH (right thalamic/basal ganglia/frontotemporal, December 2020), polysubstance abuse including cocaine and amphetamine, COPD, hypertension, hyperlipidemia who presented to the ED on 04/11/2022 for evaluation of left-sided weakness, facial swelling and generalized pruritus for approximately 3 weeks, now worse on her bilateral palms and feet, progressively worsening.  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  Unsure but suspected onset of pruritus around same time.    She arrived to ED as a code stroke and neurology was consulted.  CT head was negative and code stroke was cancelled.  There was some concern for possible meningitis due to "rash" which was scabbed areas of excoriations from itching.  LP results were reassuring.  She was started on empiric antibiotics for this versus UTI with consultation of Infectious disease.  Antibiotics stopped 11/7 given unremarkable infectious work up, no fevers or leukocytosis.    11/7 - having ongoing diffuse pruritus, worst over her head, neck and soles of feet.  No hives or rashes present.

## 2022-04-13 ENCOUNTER — Encounter: Payer: Self-pay | Admitting: Internal Medicine

## 2022-04-13 DIAGNOSIS — L299 Pruritus, unspecified: Secondary | ICD-10-CM | POA: Diagnosis not present

## 2022-04-13 DIAGNOSIS — R531 Weakness: Secondary | ICD-10-CM | POA: Diagnosis not present

## 2022-04-13 DIAGNOSIS — H5789 Other specified disorders of eye and adnexa: Secondary | ICD-10-CM | POA: Diagnosis not present

## 2022-04-13 LAB — BASIC METABOLIC PANEL
Anion gap: 5 (ref 5–15)
BUN: 15 mg/dL (ref 8–23)
CO2: 26 mmol/L (ref 22–32)
Calcium: 8.5 mg/dL — ABNORMAL LOW (ref 8.9–10.3)
Chloride: 110 mmol/L (ref 98–111)
Creatinine, Ser: 0.62 mg/dL (ref 0.44–1.00)
GFR, Estimated: >60 mL/min (ref >60)
Glucose, Bld: 98 mg/dL (ref 70–99)
Potassium: 3.3 mmol/L — ABNORMAL LOW (ref 3.5–5.1)
Sodium: 141 mmol/L (ref 135–145)

## 2022-04-13 LAB — MAGNESIUM: Magnesium: 2 mg/dL (ref 1.7–2.4)

## 2022-04-13 LAB — T3: T3, Total: 62 ng/dL — ABNORMAL LOW (ref 71–180)

## 2022-04-13 LAB — LACTIC ACID, PLASMA: Lactic Acid, Venous: 1.2 mmol/L (ref 0.5–1.9)

## 2022-04-13 NOTE — TOC Progression Note (Signed)
Transition of Care [facility]) - Progression Note    Patient Details  Name: Monica Miranda MRN: [id number] Date of Birth: Jun 22, 1960  Transition [facility]) CM/SW Contact  Tempie Hoist, [place] Phone Number: 04/13/2022, 1:22 PM  Clinical Narrative:      TOC asked [facility] if they could accept for HHPT. TOC provided them insurance and discharge location information.      Expected Discharge Plan and Services           Expected Discharge Date: 04/13/22                                     Social Determinants of Health (SDOH) Interventions    Readmission Risk Interventions     No data to display

## 2022-04-13 NOTE — Progress Notes (Signed)
Mobility Specialist - Progress Note   04/13/22 1029  Mobility  Activity Ambulated with assistance in room;Ambulated with assistance to bathroom;Stood at bedside;Dangled on edge of bed  Level of Assistance Standby assist, set-up cues, supervision of patient - no hands on  Assistive Device None  Distance Ambulated (ft) 10 ft  Activity Response Tolerated well  $Mobility charge 1 Mobility   Pt supine in bed on RA upon arrival. Pt STS and ambulates to restroom Supervision. Pt left in restroom with education on how to call for assistance.   Terrilyn Saver  Mobility Specialist  04/13/22 10:34 AM

## 2022-04-13 NOTE — Discharge Summary (Addendum)
Physician Discharge Summary   Patient: Monica Miranda MRN: [id number] DOB: [birth year]  Admit date:     04/11/2022  Discharge date: 04/13/22  Discharge Physician: Lurene Shadow   PCP: Patient, No Pcp Per   Recommendations at discharge:   Follow-up with PCP in 1 week  Discharge Diagnoses: Principal Problem:   Left-sided weakness Active Problems:   Eye swelling, bilateral   Pruritus   COPD (chronic obstructive pulmonary disease) (HCC)   Slurred speech   Chronic headaches   Lactic acidosis  Resolved Problems:   * No resolved hospital problems. [facility] Course: Monica Miranda is a 61 y.o. female with medical history significant of ICH (right thalamic/basal ganglia/frontotemporal, December 2020), polysubstance abuse including cocaine and amphetamine, COPD, hypertension, hyperlipidemia who presented to the ED on 04/11/2022 for evaluation of left-sided weakness, facial swelling and generalized pruritus for approximately 3 weeks, now worse on her bilateral palms and feet, progressively worsening.  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  Unsure but suspected onset of pruritus around same time.    She arrived to ED as a code stroke and neurology was consulted.  CT head was negative and code stroke was cancelled.  There was some concern for possible meningitis due to "rash" which was scabbed areas of excoriations from itching.  LP results were reassuring.  She was started on empiric antibiotics for this versus UTI with consultation of Infectious disease.  Antibiotics stopped 11/7 given unremarkable infectious work up, no fevers or leukocytosis.    11/7 - having ongoing diffuse pruritus, worst over her head, neck and soles of feet.  No hives or rashes present.   Facial swelling and left-sided weakness have resolved.  Itchy rash has improved.  She was evaluated  by PT for generalized weakness and home health therapy was recommended.  Overall, her condition has improved and she is deemed stable for discharge to home today.     Consultants: Neurologist, infectious disease specialist Procedures performed: Lumbar puncture Disposition: Home health Diet recommendation:  Discharge Diet Orders (From admission, onward)     Start     Ordered   04/13/22 0000  Diet - low sodium heart healthy        04/13/22 1024           Cardiac diet DISCHARGE MEDICATION: Allergies as of 04/13/2022       Reactions   Codeine Rash        Medication List     STOP taking these medications    baclofen 10 MG tablet Commonly known as: LIORESAL   cyanocobalamin 1000 MCG tablet   folic acid 1 MG tablet Commonly known as: FOLVITE   melatonin 3 MG Tabs tablet   multivitamin with minerals Tabs tablet   naltrexone 50 MG tablet Commonly known as: DEPADE   pantoprazole 40 MG tablet Commonly known as: PROTONIX   topiramate 100 MG tablet Commonly known as: TOPAMAX   traMADol 50 MG tablet Commonly known as: ULTRAM       TAKE these medications    butalbital-acetaminophen-caffeine 50-325-40 MG tablet Commonly known as: FIORICET Take 1 tablet by mouth every 6 (six) hours as needed for headache.   nicotine 21 mg/24hr patch Commonly known as: NICODERM CQ - dosed in mg/24 hours 21 mg patch daily x1 week then 14 mg patch daily x3 weeks then 7 mg patch daily x3 weeks and  stop   Ubrelvy 50 MG Tabs Generic drug: Ubrogepant Take 1 tablet by mouth 2 (two) times daily as needed.   Vitamin D (Ergocalciferol) 1.25 MG (50000 UNIT) Caps capsule Commonly known as: DRISDOL Take 1 capsule (50,000 Units total) by mouth every 7 (seven) days.        Discharge Exam: Filed Weights   04/11/22 0844 04/11/22 2135  Weight: 51.3 kg 52.6 kg   GEN: NAD SKIN: Warm and dry.  Sparse erythematous maculopapular rash on dorsal aspect of hands and feet EYES: No pallor  or icterus, no facial swelling ENT: MMM CV: RRR PULM: CTA B ABD: soft, ND, NT, +BS CNS: AAO x 3, non focal EXT: No edema or tenderness   Condition at discharge: good  The results of significant diagnostics from this hospitalization (including imaging, microbiology, ancillary and laboratory) are listed below for reference.   Imaging Studies: US Abdomen Limited RUQ (LIVER/GB)  Addendum Date: 04/12/2022   ADDENDUM REPORT: 04/12/2022 17:11 ADDENDUM: This report is addended to include an additional finding of mild gallbladder wall thickening, which is nonspecific and may be secondary to the patient's hypoalbuminemia. In the absence of gallbladder distension and right upper quadrant tenderness, acute cholecystitis remains unlikely. Electronically Signed   By: Agustin Cree M.D.   On: 04/12/2022 17:11   Result Date: 04/12/2022 CLINICAL DATA:  Elevated alkaline phosphatase EXAM: ULTRASOUND ABDOMEN LIMITED RIGHT UPPER QUADRANT COMPARISON:  None Available. FINDINGS: Gallbladder: Cholelithiasis. The gallbladder is nondistended. A large shadowing echogenic stone at the gallbladder neck appears nonmobile on real time imaging. No sonographic Murphy sign noted by sonographer. Common bile duct: Diameter: 4 mm Liver: No focal lesion identified. Within normal limits in parenchymal echogenicity. Portal vein is patent on color Doppler imaging with normal direction of blood flow towards the liver. Other: None. IMPRESSION: Cholelithiasis without sonographic findings of acute cholecystitis. Electronically Signed: By: Agustin Cree M.D. On: 04/12/2022 10:46   DG CHEST PORT 1 VIEW  Result Date: 04/11/2022 CLINICAL DATA:  Stroke.  Weakness, eye swelling, rash. EXAM: PORTABLE CHEST 1 VIEW COMPARISON:  05/25/2021 FINDINGS: Lungs are clear.  No pleural effusion or pneumothorax. The heart is normal in size. Moderate hiatal hernia. IMPRESSION: No evidence of acute cardiopulmonary disease. Electronically Signed   By: Charline Bills M.D.   On: 04/11/2022 20:26   MR BRAIN W WO CONTRAST  Result Date: 04/11/2022 CLINICAL DATA:  Left-sided weakness; prior right temporoparietal hemorrhagic infarct EXAM: MRI HEAD WITHOUT AND WITH CONTRAST TECHNIQUE: Multiplanar, multiecho pulse sequences of the brain and surrounding structures were obtained without and with intravenous contrast. CONTRAST:  36mL GADAVIST GADOBUTROL 1 MMOL/ML IV SOLN COMPARISON:  10/04/2021 FINDINGS: Brain: Redemonstrated areas of restricted diffusion about the encephalomalacia and gliosis related to the prior right temporoparietal hemorrhage, some of which corresponds with areas hemosiderin deposition. No additional restricted diffusion to suggest acute or subacute infarct. No abnormal parenchymal or meningeal enhancement. No acute hemorrhage, mass, mass effect, midline shift. No hydrocephalus or extra-axial collection. Ex vacuo dilatation of the right occipital and temporal horns. Scattered T2 hyperintense signal in the periventricular white matter, likely the sequela of chronic small vessel ischemic disease. Redemonstrated mega cisterna magna. Vascular: Normal arterial flow voids. Normal arterial and venous enhancement. Skull and upper cervical spine: Normal marrow signal. Sinuses/Orbits: Clear paranasal sinuses. The orbits are unremarkable. Other: The mastoids are well aerated. IMPRESSION: No acute intracranial process. No evidence of acute or subacute infarct. Electronically Signed   By: Wiliam Ke M.D.   On: 04/11/2022  20:09   CT HEAD CODE STROKE WO CONTRAST  Result Date: 04/11/2022 CLINICAL DATA:  Code stroke. Neuro deficit, acute, stroke suspected. Left-sided weakness. EXAM: CT HEAD WITHOUT CONTRAST TECHNIQUE: Contiguous axial images were obtained from the base of the skull through the vertex without intravenous contrast. RADIATION DOSE REDUCTION: This exam was performed according to the departmental dose-optimization program which includes automated exposure  control, adjustment of the mA and/or kV according to patient size and/or use of iterative reconstruction technique. COMPARISON:  Head CT and MRI 10/04/2021 FINDINGS: Brain: There is an unchanged, moderately large region of encephalomalacia involving the right temporal and parietal lobes at the site of a prior hemorrhage with associated ex vacuo dilatation of the right lateral ventricle. There is no evidence of an acute infarct, acute intracranial hemorrhage, mass, midline shift, or extra-axial fluid collection. 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 Vascular: Calcified atherosclerosis at the skull base. No hyperdense vessel. Skull: No acute fracture or suspicious osseous lesion. Sinuses/Orbits: Visualized paranasal sinuses and mastoid air cells are clear. Unremarkable orbits. Other: None. ASPECTS [facility] Stroke Program Early CT Score) Not scored given the presence of encephalomalacia involving much of the right MCA territory. These results were communicated to Dr. Selina Cooley at 8:34 am on 04/11/2022 by text page via the [facility] messaging system. IMPRESSION: 1. No evidence of acute intracranial abnormality. 2. Right temporoparietal encephalomalacia. Electronically Signed   By: Sebastian Ache M.D.   On: 04/11/2022 08:34    Microbiology: Results for orders placed or performed during the hospital encounter of 04/11/22  Blood culture (routine x 2)     Status: None (Preliminary result)   Collection Time: 04/11/22  8:49 AM   Specimen: BLOOD  Result Value Ref Range Status   Specimen Description BLOOD RIGHT HAND  Final   Special Requests   Final    BOTTLES DRAWN AEROBIC AND ANAEROBIC Blood Culture adequate volume   Culture   Final    NO GROWTH 2 DAYS Performed at [facility], [address], [place], [place] [address]    Report Status PENDING  Incomplete  Blood culture (routine x 2)     Status: None (Preliminary result)   Collection Time: 04/11/22  8:49 AM   Specimen: BLOOD  Result Value  Ref Range Status   Specimen Description BLOOD RIGHT [facility]  Final   Special Requests   Final    BOTTLES DRAWN AEROBIC AND ANAEROBIC Blood Culture adequate volume   Culture   Final    NO GROWTH 2 DAYS Performed at [facility], [address] [place], [place] [address]    Report Status PENDING  Incomplete  CSF culture w Gram Stain     Status: None (Preliminary result)   Collection Time: 04/11/22 12:14 PM   Specimen: CSF; Cerebrospinal Fluid  Result Value Ref Range Status   Specimen Description   Final    CSF Performed at [facility], [address] [place], [place] [address]    Special Requests   Final    NONE Performed at [facility], [address] [place], [place] [address]    Gram Stain   Final    NO ORGANISMS SEEN RED BLOOD CELLS WBC SEEN Performed at [facility], [address] [place], [place] [address]    Culture   Final    NO GROWTH 2 DAYS Performed at [facility], [address] [place], [place] [address]    Report Status PENDING  Incomplete  Urine Culture     Status: None   Collection Time: 04/11/22 12:14 PM   Specimen: Urine, Clean Catch  Result Value Ref Range Status   Specimen Description   Final    URINE, CLEAN CATCH Performed at [facility], [address], [place], [place] [address]    Special Requests   Final    NONE Performed at [facility], [address], [place], [place] [address]    Culture   Final    NO GROWTH Performed at [facility], [address] [place], [place] [address]    Report Status 04/12/2022 FINAL  Final  SARS Coronavirus 2 by RT PCR (hospital order, performed in [facility] hospital lab) *cepheid single result test* Anterior Nasal Swab     Status: None   Collection Time: 04/11/22 12:14 PM   Specimen: Anterior Nasal Swab  Result Value Ref Range Status   SARS Coronavirus 2 by RT PCR NEGATIVE NEGATIVE Final    Comment: (NOTE) SARS-CoV-2 target  nucleic acids are NOT DETECTED.  The SARS-CoV-2 RNA is generally detectable in upper and lower respiratory specimens during the acute phase of infection. The lowest concentration of SARS-CoV-2 viral copies this assay can detect is 250 copies / mL. A negative result does not preclude SARS-CoV-2 infection and should not be used as the sole basis for treatment or other patient management decisions.  A negative result may occur with improper specimen collection / handling, submission of specimen other than nasopharyngeal swab, presence of viral mutation(s) within the areas targeted by this assay, and inadequate number of viral copies (<250 copies / mL). A negative result must be combined with clinical observations, patient history, and epidemiological information.  Fact Sheet for Patients:   RoadLapTop.co.za  Fact Sheet for Healthcare Providers: http://kim-miller.com/  This test is not yet approved or  cleared by the [place] FDA and has been authorized for detection and/or diagnosis of SARS-CoV-2 by FDA under an Emergency Use Authorization (EUA).  This EUA will remain in effect (meaning this test can be used) for the duration of the COVID-19 declaration under Section 564(b)(1) of the Act, 21 U.S.C. section 360bbb-3(b)(1), unless the authorization is terminated or revoked sooner.  Performed at [facility], [address] [place], [place] [address]   Respiratory (~20 pathogens) panel by PCR     Status: None   Collection Time: 04/11/22  9:26 PM   Specimen: Nasopharyngeal Swab; Respiratory  Result Value Ref Range Status   Adenovirus NOT DETECTED NOT DETECTED Final   Coronavirus 229E NOT DETECTED NOT DETECTED Final    Comment: (NOTE) The Coronavirus on the Respiratory Panel, DOES NOT test for the novel  Coronavirus (2019 nCoV)    Coronavirus HKU1 NOT DETECTED NOT DETECTED Final   Coronavirus NL63 NOT DETECTED NOT DETECTED  Final   Coronavirus OC43 NOT DETECTED NOT DETECTED Final   Metapneumovirus NOT DETECTED NOT DETECTED Final   Rhinovirus / Enterovirus NOT DETECTED NOT DETECTED Final   Influenza A NOT DETECTED NOT DETECTED Final   Influenza B NOT DETECTED NOT DETECTED Final   Parainfluenza Virus 1 NOT DETECTED NOT DETECTED Final   Parainfluenza Virus 2 NOT DETECTED NOT DETECTED Final   Parainfluenza Virus 3 NOT DETECTED NOT DETECTED Final   Parainfluenza Virus 4 NOT DETECTED NOT DETECTED Final   Respiratory Syncytial Virus NOT DETECTED NOT DETECTED Final   Bordetella pertussis NOT DETECTED NOT DETECTED Final   Bordetella Parapertussis NOT DETECTED NOT DETECTED Final  Chlamydophila pneumoniae NOT DETECTED NOT DETECTED Final   Mycoplasma pneumoniae NOT DETECTED NOT DETECTED Final    Comment: Performed at [facility], [address] [place], [place] [address]    Labs: CBC: Recent Labs  Lab 04/11/22 0813 04/12/22 0434  WBC 7.5 8.3  NEUTROABS 5.0  --   HGB 14.3 10.8*  HCT 44.7 33.8*  MCV 83.4 83.3  PLT 347 270   Basic Metabolic Panel: Recent Labs  Lab 04/11/22 0813 04/12/22 0434 04/13/22 0521  NA 141 144 141  K 4.0 3.4* 3.3*  CL 110 116* 110  CO2 25 25 26   GLUCOSE 95 118* 98  BUN 17 14 15   CREATININE 0.55 0.55 0.62  CALCIUM 9.2 8.5* 8.5*  MG  --   --  2.0   Liver Function Tests: Recent Labs  Lab 04/11/22 0813 04/12/22 0434  AST 35 36  ALT 39 48*  ALKPHOS 146* 112  BILITOT 0.5 0.4  PROT 7.1 5.3*  ALBUMIN 3.9 2.9*   CBG: Recent Labs  Lab 04/11/22 0808  GLUCAP 101*    Discharge time spent: greater than 30 minutes.  Signed: Lurene ShadowBERNARD Jayvon Mounger, MD [facility] 04/13/2022

## 2022-04-13 NOTE — Progress Notes (Signed)
Mobility Specialist - Progress Note   04/13/22 1038  Mobility  Activity Ambulated with assistance in room  Level of Assistance Standby assist, set-up cues, supervision of patient - no hands on  Assistive Device None  Distance Ambulated (ft) 10 ft  Activity Response Tolerated well  $Mobility charge 1 Mobility   Pt OOB in restroom upon arrival. Pt ambulates back to bed Supervision. Pt is left in bed with needs in reach and bed alarm on.   Terrilyn Saver  Mobility Specialist  04/13/22 10:39 AM

## 2022-04-13 NOTE — TOC Transition Note (Signed)
Transition of Care [facility]) - CM/SW Discharge Note   Patient Details  Name: Monica Miranda MRN: [id number] Date of Birth: 08/08/1960  [facility]) CM/SW Contact:  Tempie Hoist, LCSWA Phone Number: 04/13/2022, 4:38 PM   Clinical Narrative:     Patient was not accepted to home health.  Final next level of care: Home/Self Care     Patient Goals and CMS Choice      Home/self care  Discharge Placement                 Will need to follow up with PCP regarding HH.      Discharge Plan and Services                                     Social Determinants of Health (SDOH) Interventions     Readmission Risk Interventions     No data to display

## 2022-04-14 LAB — CSF CULTURE W GRAM STAIN
Culture: NO GROWTH
Gram Stain: NONE SEEN

## 2022-04-16 LAB — CULTURE, BLOOD (ROUTINE X 2)
Culture: NO GROWTH
Culture: NO GROWTH
Special Requests: ADEQUATE
Special Requests: ADEQUATE

## 2022-04-22 ENCOUNTER — Encounter: Payer: Self-pay | Admitting: Physical Medicine and Rehabilitation

## 2022-04-22 ENCOUNTER — Encounter
Payer: Medicaid Other | Attending: Physical Medicine and Rehabilitation | Admitting: Physical Medicine and Rehabilitation

## 2022-04-22 VITALS — BP 142/95 | HR 88 | Ht 60.0 in | Wt 116.0 lb

## 2022-04-22 DIAGNOSIS — I610 Nontraumatic intracerebral hemorrhage in hemisphere, subcortical: Secondary | ICD-10-CM | POA: Diagnosis not present

## 2022-04-22 DIAGNOSIS — G43909 Migraine, unspecified, not intractable, without status migrainosus: Secondary | ICD-10-CM

## 2022-04-22 DIAGNOSIS — L299 Pruritus, unspecified: Secondary | ICD-10-CM

## 2022-04-22 MED ORDER — HYDROXYZINE HCL 10 MG PO TABS: 10.0000 mg | ORAL_TABLET | Freq: Three times a day (TID) | ORAL | 0 refills | Status: DC | PRN

## 2022-04-22 NOTE — Patient Instructions (Signed)
Apply sarna lotion or coconut oil

## 2022-04-22 NOTE — Addendum Note (Signed)
Addended by: Horton Chin on: 04/22/2022 10:59 AM   Modules accepted: Orders

## 2022-04-22 NOTE — Progress Notes (Addendum)
Subjective:    Patient ID: Monica Miranda, female    DOB: 13-May-1961, 61 y.o.   MRN: [id number]  HPI  Monica Miranda is a 61 year old woman who presents for f/u of migraines and hemorrhagic stroke.   1) Hemorrhagic stroke -she asks about repeat Head CT in September -she has been doing well at home -she has not yet returned to jogging but she hopes to -she has been walking [place] a nature trail by her son's home  2) Left ankle pain: -she is waking up at night with pain due to the [facility] brace, her daughter-in-law adjusted the brace and she feels this caused her to have pain -she brings in the [facility] boot today.  -she wants to wear the [facility] so her foot does not become inverted.  -her cast is digging into the tibia.  -having swelling over the the medical aspct of the ankle.  -still wearing PRAFo at night and asks for how long she should use it for. -she has an appointment with [facility] for new AFO   3) Left upper extremity weakness -strength has improved  4) Muscle spasms -more frequent -she prefers the baclofen over the tramadol. It does make her a little sleepy -does not use baclofen  5) Migraines: -she is not having as frequent migraines as before, but when she gets them, often the Topamax and Fioricet are not enough to break them -she usually breaks the Fioricet into 1/4 tablet as she does not like to take medications for her pain -she also takes NAC for her migraines and this makes her sleepy -she asks if there is anything else we can give her to help with her migraines -she has been having a lot of family stressors recently and she feels these have worsened the migraines -topamax is not helping with migraines so she stopped this.  -Fioricet she cures in half twice -she would rather do the NAC -she does not want to try 100% oxygen.  -she was advised to stop her Vitamin D and B12  6) Pruritus: -itching on her hands that spreads around her body -she dug a hole in her foot  scratching -the pruritus is miserable -she has been taking Benadryl because she can't sleep -she has bumps on her forehead.    Pain Inventory Average Pain 10 Pain Right Now 10 My pain is  itching  LOCATION OF PAIN  all over the body  BOWEL Number of stools per week: 7   BLADDER Normal I   Mobility walk without assistance how many minutes can you walk? 20-30 ability to climb steps?  yes do you drive?  no  Function disabled: date disabled . I need assistance with the following:  dressing, bathing, meal prep, household duties, and shopping  Neuro/Psych No problems in this area trouble walking  Prior Studies Any changes since last visit?  no  Physicians involved in your care Any changes since last visit?  no   History reviewed. No pertinent family history. 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; marijuana daily   Sexual activity: Not on file  Other Topics Concern   Not on file  Social History Narrative   Not on file   Social Determinants of Health   Financial Resource Strain: Not on file  Food Insecurity: No Food Insecurity (04/11/2022)   Hunger Vital Sign    Worried About Running Out of Food in the Last Year: Never true    Ran Out of Food in the Last Year: Never true  Transportation Needs: No Transportation Needs (04/11/2022)   PRAPARE - Doctor, general practice (Medical): No    Lack of Transportation (Non-Medical): No  Physical Activity: Not on file  Stress: Not on file  Social Connections: Not on file   Past Surgical History:  Procedure Laterality Date   ESOPHAGEAL DILATION     Past Medical History:  Diagnosis Date   COPD (chronic obstructive pulmonary disease) (HCC)    Rib fracture    4 months ago   Stroke (HCC)    BP (!) 142/95   Pulse 88   Ht 5' (1.524 m)   Wt 116 lb (52.6 kg)   SpO2 97%   BMI 22.65 kg/m   Opioid Risk Score:   Fall Risk Score:  `1  Depression screen [facility] 2/9     04/22/2022   10:08 AM 11/23/2021   10:02 AM 06/28/2021   10:36 AM  Depression screen PHQ 2/9  Decreased Interest 0 0 0  Down, Depressed, Hopeless 0 0 0  PHQ - 2 Score 0 0 0  Altered sleeping   0  Tired, decreased energy   0  Change in appetite   0  Feeling bad or failure about yourself    0  Trouble concentrating   0  Moving slowly or fidgety/restless   0  Suicidal thoughts   0  PHQ-9 Score   0     Review of Systems  Musculoskeletal:  Positive for gait problem.       Spasms  Neurological:  Positive for weakness.  All other systems reviewed and are negative.      Objective:   Physical Exam Gen: no distress, normal appearing HEENT: oral mucosa pink and moist, NCAT Cardio: Reg rate Chest: normal effort, normal rate of breathing Abd: soft, non-distended Ext: no edema Psych: pleasant, normal affect Skin: breakout on forehead Neuro: Alert and oriented, verbose Musculoskeletal: Ambulating without AD Assessment & Plan:  1) Muscle spasms -discussed that this could be a sign of motor recovery.  -continue baclofen prn. -can stop tramadol -continue  bacopa.  -Provided with a pain relief journal and discussed that it contains foods and lifestyle tips to naturally help to improve pain. Discussed that these lifestyle strategies are also very good for health unlike some medications which can have negative side effects.  Discussed that the act of keeping a journal can be therapeutic and helpful to realize patterns what helps to trigger and alleviate pain.   -Provided with a pain relief journal and discussed that it contains foods and lifestyle tips to naturally help to improve pain. Discussed that these lifestyle strategies are also very good for health unlike some medications which can have negative side  effects. Discussed that the act of keeping a journal can be therapeutic and helpful to realize patterns what helps to trigger and alleviate pain.    2) Left lower extremity weakness: -adjusted her PRAFO  3) SAH: -continue therapies -reviewed recent MRI with her -discussed that she can restart jogging.  -provided dietary and exercise counseling -discussed that hypertension is number one reversible risk factor for stroke.   -recommended only repeating Head CT if needed, we expect blood to resorb  4) Migraines -prescribed [name], discussed that she can use this to break migraine, and if migraine persists after 2 hours, she can take another dose. Discussed that she should not take more than 2 doses in 24 hours. Not covered.  -discussed that [name] has a safer side effect profile than the Triptans given her history of stroke. -currently Topamax and Fioricet are not enough to break her migraines -recommended Magnesium breakthrough -refilled her Fioricet this week -d/c topamax since does not appear to help and she does not want decreased appetite.  -recommended trying use of caffeine to help break migraines -discussed that food allergen testing could be helpful.   6) Smoking cessation: prescribed Naltrexone.   7) Left lower extremity swelling: -vascular ultrasound ordered and shows no evidence of clot, discussed with patient.   8) Pruritus: -discussed referral to allergy and immunology -continue Benadryl prn -discussed hydroxyzine.   9) Muscular atrophy: -Prescribed Zynex Nexwave

## 2022-04-22 NOTE — Addendum Note (Signed)
Addended by: Horton Chin on: 04/22/2022 11:26 AM   Modules accepted: Orders

## 2022-05-05 ENCOUNTER — Telehealth: Payer: Self-pay | Admitting: *Deleted

## 2022-05-05 NOTE — Telephone Encounter (Signed)
Mrs Crowell called and reports that the order for the new stimulator was missing a check on the form and they are requiring you send a new order with the form with all the boxes checked.

## 2022-05-06 ENCOUNTER — Telehealth: Payer: Self-pay | Admitting: *Deleted

## 2022-05-06 NOTE — Telephone Encounter (Signed)
Monica Miranda is requesting a refill on her hydroxyzine and her fiorcet.

## 2022-05-09 ENCOUNTER — Other Ambulatory Visit: Payer: Self-pay | Admitting: Physical Medicine and Rehabilitation

## 2022-05-09 MED ORDER — HYDROXYZINE HCL 10 MG PO TABS: 10.0000 mg | ORAL_TABLET | Freq: Three times a day (TID) | ORAL | 0 refills | Status: DC | PRN

## 2022-05-09 MED ORDER — BUTALBITAL-APAP-CAFFEINE 50-325-40 MG PO TABS: 1.0000 | ORAL_TABLET | Freq: Four times a day (QID) | ORAL | 3 refills | Status: DC | PRN

## 2022-05-16 ENCOUNTER — Encounter: Payer: Self-pay | Admitting: *Deleted

## 2022-05-31 ENCOUNTER — Ambulatory Visit: Payer: Medicaid Other | Admitting: Physical Medicine and Rehabilitation

## 2022-06-28 ENCOUNTER — Encounter
Payer: Medicaid Other | Attending: Physical Medicine and Rehabilitation | Admitting: Physical Medicine and Rehabilitation

## 2022-06-28 DIAGNOSIS — L299 Pruritus, unspecified: Secondary | ICD-10-CM | POA: Insufficient documentation

## 2022-06-28 DIAGNOSIS — I610 Nontraumatic intracerebral hemorrhage in hemisphere, subcortical: Secondary | ICD-10-CM | POA: Insufficient documentation

## 2022-06-28 DIAGNOSIS — G43909 Migraine, unspecified, not intractable, without status migrainosus: Secondary | ICD-10-CM | POA: Insufficient documentation

## 2022-07-29 ENCOUNTER — Other Ambulatory Visit: Payer: Self-pay

## 2022-07-29 ENCOUNTER — Other Ambulatory Visit: Payer: Self-pay | Admitting: Physical Medicine and Rehabilitation

## 2022-07-29 MED ORDER — BUTALBITAL-APAP-CAFFEINE 50-325-40 MG PO TABS: 1.0000 | ORAL_TABLET | Freq: Four times a day (QID) | ORAL | 0 refills | Status: DC | PRN

## 2022-07-29 NOTE — Telephone Encounter (Signed)
Patient requesting a refill Floricet. Please send to [facility] on [address] in [place]. Thank you.

## 2022-10-13 ENCOUNTER — Telehealth: Payer: Self-pay

## 2022-10-13 NOTE — Telephone Encounter (Signed)
PA for Ubrelvy submitted 

## 2022-10-13 NOTE — Telephone Encounter (Signed)
[name] approved Outcome Approved today Approved. This drug has been approved. Approved quantity: 16 tablets per 8 day(s). You may fill up to a 34 day supply at a retail pharmacy. You may fill up to a 90 day supply for maintenance drugs, please refer to the formulary for details. Please call the pharmacy to process your prescription claim. Authorization Expiration Date: 10/13/2023

## 2022-10-31 ENCOUNTER — Other Ambulatory Visit: Payer: Self-pay | Admitting: Physical Medicine and Rehabilitation

## 2022-11-01 ENCOUNTER — Encounter
Payer: Medicaid Other | Attending: Physical Medicine and Rehabilitation | Admitting: Physical Medicine and Rehabilitation

## 2022-11-01 VITALS — BP 129/80 | HR 67 | Ht 60.0 in | Wt 101.6 lb

## 2022-11-01 DIAGNOSIS — R252 Cramp and spasm: Secondary | ICD-10-CM

## 2022-11-01 DIAGNOSIS — I69398 Other sequelae of cerebral infarction: Secondary | ICD-10-CM

## 2022-11-01 DIAGNOSIS — R110 Nausea: Secondary | ICD-10-CM | POA: Diagnosis present

## 2022-11-01 DIAGNOSIS — G43909 Migraine, unspecified, not intractable, without status migrainosus: Secondary | ICD-10-CM | POA: Diagnosis present

## 2022-11-01 MED ORDER — BUTALBITAL-APAP-CAFFEINE 50-325-40 MG PO TABS: 1.0000 | ORAL_TABLET | Freq: Four times a day (QID) | ORAL | 0 refills | Status: DC | PRN

## 2022-11-01 MED ORDER — UBRELVY 100 MG PO TABS: 1.0000 | ORAL_TABLET | Freq: Every day | ORAL | 3 refills | Status: DC | PRN

## 2022-11-01 MED ORDER — SCOPOLAMINE 1 MG/3DAYS TD PT72: 1.0000 | MEDICATED_PATCH | TRANSDERMAL | 12 refills | Status: AC

## 2022-11-01 NOTE — Patient Instructions (Signed)
Red light therapy  Foods that may reduce pain: 1) Ginger (especially studied for arthritis)- reduce leukotriene production to decrease inflammation 2) Blueberries- high in phytonutrients that decrease inflammation 3) Salmon- marine omega-3s reduce joint swelling and pain 4) Pumpkin seeds- reduce inflammation 5) dark chocolate- reduces inflammation 6) turmeric- reduces inflammation 7) tart cherries - reduce pain and stiffness 8) extra virgin olive oil - its compound olecanthal helps to block prostaglandins  9) chili peppers- can be eaten or applied topically via capsaicin 10) mint- helpful for headache, muscle aches, joint pain, and itching 11) garlic- reduces inflammation  Link to further information on diet for chronic pain: [email or web address]

## 2022-11-01 NOTE — Progress Notes (Signed)
Subjective:    Patient ID: Monica Miranda, female    DOB: 1960-07-01, 62 y.o.   MRN: [id number]  HPI  Monica Miranda is a 62 year old woman who presents for f/u of migraines and hemorrhagic stroke, and nausea  1) Hemorrhagic stroke -she asks about repeat Head CT in September -she has been doing well at home -she has not yet returned to jogging but she hopes to -she has been walking on a nature trail by her son's home  2) Left ankle pain: -she is waking up at night with pain due to the [facility] brace, her daughter-in-law adjusted the brace and she feels this caused her to have pain -she brings in the [facility] boot today.  -she wants to wear the [facility] so her foot does not become inverted.  -her cast is digging into the tibia.  -having swelling over the the medical aspct of the ankle.  -still wearing PRAFo at night and asks for how long she should use it for. -she has an appointment with [facility] for new AFO   3) Left upper extremity weakness -strength has improved  4) Muscle spasms -more frequent -she prefers the baclofen over the tramadol. It does make her a little sleepy -does not use baclofen  5) Migraines: -had a bad migraine last night -was not able to eat last night -she is not having as frequent migraines as before, but when she gets them, often the Topamax and Fioricet are not enough to break them -she usually breaks the Fioricet into 1/4 tablet as she does not like to take medications for her pain -she also takes NAC for her migraines and this makes her sleepy -she asks if there is anything else we can give her to help with her migraines -she has been having a lot of family stressors recently and she feels these have worsened the migraines -topamax is not helping with migraines so she stopped this.  -Fioricet she cures in half twice -she would rather do the NAC -she does not want to try 100% oxygen.  -she was advised to stop her Vitamin D and B12  6) Pruritus: -itching on  her hands that spreads around her body -she dug a hole in her foot scratching -the pruritus is miserable -she has been taking Benadryl because she can't sleep -she has bumps on her forehead.   7) Nausea: -nauseous this morning  8) Spasticity: -she asks if this is a goof thing -she is using the TENS unit and likes this -does not feel pain but feels discomfort   Pain Inventory Average Pain 10 Pain Right Now 4 My pain is intermittent and aching  In the last 24 hours, has pain interfered with the following? General activity 6 Relation with others 6 Enjoyment of life 6 What TIME of day is your pain at its worst? varies Sleep (in general) Fair  Pain is worse with:  sensitive to light Pain improves with: medication Relief from Meds: 7  No family history on file. 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; marijuana daily   Sexual activity: Not on file  Other Topics Concern   Not on file  Social History Narrative   Not on file   Social Determinants of Health   Financial Resource Strain: Not on file  Food Insecurity: No Food Insecurity (04/11/2022)   Hunger Vital Sign    Worried About Running Out of Food in the Last Year: Never true    Ran Out of Food in the Last Year: Never true  Transportation Needs: No Transportation Needs (04/11/2022)   PRAPARE - Scientist, research (physical sciences) (Medical): No    Lack of Transportation (Non-Medical): No  Physical Activity: Not on file  Stress: Not on file  Social Connections: Not on file   Past Surgical History:  Procedure Laterality Date   ESOPHAGEAL DILATION     Past Surgical History:  Procedure Laterality Date   ESOPHAGEAL DILATION     Past Medical History:  Diagnosis Date   COPD (chronic obstructive pulmonary disease) (HCC)    Rib fracture    4 months ago   Stroke (HCC)    BP 129/80   Pulse 67   Ht 5' (1.524 m)   Wt 101 lb 9.6 oz (46.1 kg)   SpO2 93%   BMI 19.84 kg/m   Opioid Risk Score:   Fall Risk Score:  `1  Depression screen [facility] 2/9     04/22/2022   10:08 AM 11/23/2021   10:02 AM 06/28/2021   10:36 AM  Depression screen PHQ 2/9  Decreased Interest 0 0 0  Down, Depressed, Hopeless 0 0 0  PHQ - 2 Score 0 0 0  Altered sleeping   0  Tired, decreased energy   0  Change in appetite   0  Feeling bad or failure about yourself    0  Trouble concentrating   0  Moving slowly or fidgety/restless   0  Suicidal thoughts   0  PHQ-9 Score   0     No family history on file. 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; marijuana daily   Sexual activity: Not on file  Other Topics Concern   Not on file  Social History Narrative   Not on file   Social Determinants of Health   Financial Resource  Strain: Not on file  Food Insecurity: No Food Insecurity (04/11/2022)   Hunger Vital Sign    Worried About Running  Out of Food in the Last Year: Never true    Ran Out of Food in the Last Year: Never true  Transportation Needs: No Transportation Needs (04/11/2022)   PRAPARE - Administrator, Civil Service (Medical): No    Lack of Transportation (Non-Medical): No  Physical Activity: Not on file  Stress: Not on file  Social Connections: Not on file   Past Surgical History:  Procedure Laterality Date   ESOPHAGEAL DILATION     Past Medical History:  Diagnosis Date   COPD (chronic obstructive pulmonary disease) (HCC)    Rib fracture    4 months ago   Stroke [facility])    There were no vitals taken for this visit.  Opioid Risk Score:   Fall Risk Score:  `1  Depression screen [facility] 2/9     04/22/2022   10:08 AM 11/23/2021   10:02 AM 06/28/2021   10:36 AM  Depression screen PHQ 2/9  Decreased Interest 0 0 0  Down, Depressed, Hopeless 0 0 0  PHQ - 2 Score 0 0 0  Altered sleeping   0  Tired, decreased energy   0  Change in appetite   0  Feeling bad or failure about yourself    0  Trouble concentrating   0  Moving slowly or fidgety/restless   0  Suicidal thoughts   0  PHQ-9 Score   0     Review of Systems  Gastrointestinal:  Positive for nausea.  Musculoskeletal:  Positive for gait problem.       Spasms  Neurological:  Positive for weakness and headaches.  All other systems reviewed and are negative.      Objective:   Physical Exam Gen: no distress, normal appearing HEENT: oral mucosa pink and moist, NCAT Cardio: Reg rate Chest: normal effort, normal rate of breathing Abd: soft, non-distended Ext: no edema Psych: pleasant, normal affect Skin: intact Neuro: Alert and oriented, verbose Musculoskeletal: Ambulating without AD Assessment & Plan:  1) Muscle spasms/spasticity -discussed that this could be a sign of motor recovery.  -continue baclofen  prn. -discussed the risks and benefits of spasticity -can stop tramadol -continue  bacopa.  -Provided with a pain relief journal and discussed that it contains foods and lifestyle tips to naturally help to improve pain. Discussed that these lifestyle strategies are also very good for health unlike some medications which can have negative side effects. Discussed that the act of keeping a journal can be therapeutic and helpful to realize patterns what helps to trigger and alleviate pain.   -Provided with a pain relief journal and discussed that it contains foods and lifestyle tips to naturally help to improve pain. Discussed that these lifestyle strategies are also very good for health unlike some medications which can have negative side effects. Discussed that the act of keeping a journal can be therapeutic and helpful to realize patterns what helps to trigger and alleviate pain.    2) Left lower extremity weakness: -adjusted her PRAFO  3) SAH: -continue therapies -reviewed recent MRI with her -discussed that she can restart jogging.  -provided dietary and exercise counseling -discussed that hypertension is number one reversible risk factor for stroke.   -recommended only repeating Head CT if needed, we expect blood to resorb  4) Migraines Migraines -prescribed Ubrelvy 50mg , discussed that she can use this to break migraine, and if migraine persists after 2 hours, she can take another dose. Discussed that she should not take more than 2 doses  in 24 hours -will give Ubrelvy samplea -discussed that [name] has a safer side effect profile than the Triptans  -discussed that [name] has a safer side effect profile than the Triptans given her history of stroke. -refilled Fioricet, discussed possible withdrawal headaches with this medication.  -currently Topamax and Fioricet are not enough to break her migraines -recommended Magnesium breakthrough -refilled her Fioricet this week -d/c topamax  since does not appear to help and she does not want decreased appetite.  -recommended trying use of caffeine to help break migraines -discussed that food allergen testing could be helpful.   6) Smoking cessation: prescribed Naltrexone.   7) Left lower extremity swelling: -vascular ultrasound ordered and shows no evidence of clot, discussed with patient.   8) Pruritus: -discussed referral to allergy and immunology -continue Benadryl prn -discussed hydroxyzine.   9) Muscular atrophy: -Prescribed Zynex Nexwave, continue use  10) Nausea: scopolamine patch ordered

## 2022-11-02 ENCOUNTER — Telehealth: Payer: Self-pay

## 2022-11-02 NOTE — Telephone Encounter (Signed)
PA submitted for Ubrelvy and Scopolamine

## 2022-11-03 NOTE — Telephone Encounter (Signed)
Ubrevly approved through 11/02/2023. Scopolamine is covered on the preferred drug list

## 2022-11-04 NOTE — Telephone Encounter (Signed)
Outcome Approved on May 29 Approved. This drug is covered on the Preferred Drug List. It does not require prior approval. Please call the pharmacy to process the claim. Authorization Expiration Date: 06/06/2023

## 2022-11-04 NOTE — Telephone Encounter (Signed)
Approved on May 29 Approved. This drug has been approved. Approved quantity: 16 tablet per 30 day(s). You may fill up to a 34 day supply at a retail pharmacy. You may fill up to a 90 day supply for maintenance drugs, please refer to the formulary for details. Please call the pharmacy to process your prescription claim. Authorization Expiration Date: 11/02/2023

## 2023-01-13 ENCOUNTER — Telehealth: Payer: Self-pay | Admitting: *Deleted

## 2023-01-13 NOTE — Telephone Encounter (Signed)
Patient states she has been taking [place] and it is not helping with migraines. She is requesting refill on Fioricet.

## 2023-01-16 ENCOUNTER — Other Ambulatory Visit: Payer: Self-pay | Admitting: Physical Medicine and Rehabilitation

## 2023-01-16 MED ORDER — BUTALBITAL-APAP-CAFFEINE 50-325-40 MG PO TABS: 1.0000 | ORAL_TABLET | Freq: Four times a day (QID) | ORAL | 0 refills | Status: DC | PRN

## 2023-01-30 ENCOUNTER — Encounter
Payer: Medicaid Other | Attending: Physical Medicine and Rehabilitation | Admitting: Physical Medicine and Rehabilitation

## 2023-03-26 ENCOUNTER — Other Ambulatory Visit: Payer: Self-pay | Admitting: Physical Medicine and Rehabilitation

## 2023-04-20 ENCOUNTER — Telehealth: Payer: Self-pay

## 2023-04-20 NOTE — Telephone Encounter (Signed)
Refill request. Pt made an appt for 11/18

## 2023-04-20 NOTE — Telephone Encounter (Signed)
I attached it to this encounter Fioricet

## 2023-04-21 ENCOUNTER — Other Ambulatory Visit: Payer: Self-pay | Admitting: Physical Medicine and Rehabilitation

## 2023-04-21 MED ORDER — BUTALBITAL-APAP-CAFFEINE 50-325-40 MG PO TABS: 1.0000 | ORAL_TABLET | Freq: Four times a day (QID) | ORAL | 0 refills | Status: DC | PRN

## 2023-04-24 ENCOUNTER — Encounter: Payer: Medicaid Other | Admitting: Physical Medicine and Rehabilitation

## 2023-06-06 ENCOUNTER — Telehealth: Payer: Self-pay

## 2023-06-13 ENCOUNTER — Ambulatory Visit: Payer: Self-pay | Admitting: Physical Medicine and Rehabilitation

## 2023-06-19 ENCOUNTER — Encounter
Payer: Medicaid Other | Attending: Physical Medicine and Rehabilitation | Admitting: Physical Medicine and Rehabilitation

## 2023-06-19 ENCOUNTER — Encounter: Payer: Self-pay | Admitting: Physical Medicine and Rehabilitation

## 2023-06-19 VITALS — BP 151/96 | HR 62 | Ht 60.0 in | Wt 103.0 lb

## 2023-06-19 DIAGNOSIS — I10 Essential (primary) hypertension: Secondary | ICD-10-CM | POA: Diagnosis not present

## 2023-06-19 DIAGNOSIS — R229 Localized swelling, mass and lump, unspecified: Secondary | ICD-10-CM | POA: Diagnosis present

## 2023-06-19 DIAGNOSIS — I609 Nontraumatic subarachnoid hemorrhage, unspecified: Secondary | ICD-10-CM

## 2023-06-19 DIAGNOSIS — F17200 Nicotine dependence, unspecified, uncomplicated: Secondary | ICD-10-CM | POA: Diagnosis not present

## 2023-06-19 DIAGNOSIS — G43909 Migraine, unspecified, not intractable, without status migrainosus: Secondary | ICD-10-CM | POA: Diagnosis not present

## 2023-06-19 MED ORDER — NICOTINE 7 MG/24HR TD PT24: 7.0000 mg | MEDICATED_PATCH | TRANSDERMAL | 3 refills | Status: DC

## 2023-06-19 MED ORDER — NICOTINE 7 MG/24HR TD PT24: 7.0000 mg | MEDICATED_PATCH | TRANSDERMAL | 3 refills | Status: AC

## 2023-06-19 NOTE — Patient Instructions (Signed)
 HTN: -Advised checking BP daily at home and logging results to bring into follow-up appointment with PCP and myself. -Reviewed BP meds today.  -Advised regarding healthy foods that can help lower blood pressure and provided with a list: 1) citrus foods- high in vitamins and minerals 2) salmon and other fatty fish - reduces inflammation and oxylipins 3) swiss chard (leafy green)- high level of nitrates 4) pumpkin seeds- one of the best natural sources of magnesium 5) Beans and lentils- high in fiber, magnesium, and potassium 6) Berries- high in flavonoids 7) Amaranth (whole grain, can be cooked similarly to rice and oats)- high in magnesium and fiber 8) Pistachios- even more effective at reducing BP than other nuts 9) Carrots- high in phenolic compounds that relax blood vessels and reduce inflammation 10) Celery- contain phthalides that relax tissues of arterial walls 11) Tomatoes- can also improve cholesterol and reduce risk of heart disease 12) Broccoli- good source of magnesium, calcium, and potassium 13) Greek yogurt: high in potassium and calcium 14) Herbs and spices: Celery seed, cilantro, saffron, lemongrass, black cumin, ginseng, cinnamon, cardamom, sweet basil, and ginger 15) Chia and flax seeds- also help to lower cholesterol and blood sugar 16) Beets- high levels of nitrates that relax blood vessels  17) spinach and bananas- high in potassium  -Provided lise of supplements that can help with hypertension:  1) magnesium: one high quality brand is Bioptemizers since it contains all 7 types of magnesium, otherwise over the counter magnesium gluconate 400mg  is a good option 2) B vitamins 3) vitamin D 4) potassium 5) CoQ10 6) L-arginine 7) Vitamin C 8) Beetroot -Educated that goal BP is 120/80. -Made goal to incorporate some of the above foods into diet.    Foods that may reduce pain: 1) Ginger (especially studied for arthritis)- reduce leukotriene production to decrease  inflammation 2) Blueberries- high in phytonutrients that decrease inflammation 3) Salmon- marine omega-3s reduce joint swelling and pain 4) Pumpkin seeds- reduce inflammation 5) dark chocolate- reduces inflammation 6) turmeric- reduces inflammation 7) tart cherries - reduce pain and stiffness 8) extra virgin olive oil - its compound olecanthal helps to block prostaglandins  9) chili peppers- can be eaten or applied topically via capsaicin 10) mint- helpful for headache, muscle aches, joint pain, and itching 11) garlic- reduces inflammation  Link to further information on diet for chronic pain: [email or web address]

## 2023-06-19 NOTE — Addendum Note (Signed)
 Addended by: Horton Chin on: 06/19/2023 01:18 PM   Modules accepted: Orders

## 2023-06-19 NOTE — Progress Notes (Addendum)
 Subjective:    Patient ID: Monica Miranda, female    DOB: 1961-04-04, 63 y.o.   MRN: [id number]  HPI  Monica Miranda is a 63 year old woman who presents for f/u of migraines and hemorrhagic stroke, and nausea  1) Hemorrhagic stroke -she asks about repeat Head CT in September -she has been doing well at home -she has not yet returned to jogging but she hopes to -she has been walking [place] a nature trail by her son's home -motor control of left arm continues to be impaired  2) Left ankle pain: -she is waking up at night with pain due to the PRAFO brace, her daughter-in-law adjusted the brace and she feels this caused her to have pain -she brings in the [facility] boot today.  -she wants to wear the [facility] so her foot does not become inverted.  -her cast is digging into the tibia.  -having swelling over the the medical aspct of the ankle.  -still wearing PRAFo at night and asks for how long she should use it for. -she has an appointment with [facility] for new AFO   3) Left upper extremity weakness -strength has improved  4) Muscle spasms -more frequent -she prefers the baclofen  over the tramadol . It does make her a little sleepy -does not use baclofen   5) Migraines: -vary depending on her activity -she has a dog and walks for a mile with him every day- half a mile when the weather is bad -had a bad migraine last night -was not able to eat last night -she is not having as frequent migraines as before, but when she gets them, often the Topamax  and Fioricet  are not enough to break them -she usually breaks the Fioricet  into 1/4 tablet as she does not like to take medications for her pain -she also takes NAC for her migraines and this makes her sleepy -she asks if there is anything else we can give her to help with her migraines -she has been having a lot of family stressors recently and she feels these have worsened the migraines -topamax  is not helping with migraines so she stopped this.   -Fioricet  she cures in half twice -she would rather do the NAC -she does not want to try 100% oxygen.  -she was advised to stop her Vitamin D  and B12  6) Pruritus: -itching on her hands that spreads around her body -she dug a hole in her foot scratching -the pruritus is miserable -she has been taking Benadryl  because she can't sleep -she has bumps on her forehead.   7) Nausea: -nauseous this morning  8) Spasticity: -she asks if this is a goof thing -she is using the TENS unit and likes this -does not feel pain but feels discomfort   Pain Inventory Average Pain 9 Pain Right Now 0 My pain is intermittent, aching, and throb  In the last 24 hours, has pain interfered with the following? General activity 0 Relation with others 0 Enjoyment of life sometime  What TIME of day is your pain at its worst? varies Sleep (in general) Fair  Pain is worse with:  sensitive to light Pain improves with: medication Relief from Meds: 9  No family history on file. 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; marijuana daily   Sexual activity: Not on file  Other Topics Concern   Not on file  Social History Narrative   Not on file   Social Drivers of Health   Financial Resource Strain: Not on file  Food Insecurity: No Food Insecurity (04/11/2022)   Hunger Vital Sign    Worried  About Running Out of Food in the Last Year: Never true    Ran Out of Food in the Last Year: Never true  Transportation Needs: No Transportation Needs (04/11/2022)   PRAPARE - Administrator, Civil Service (Medical): No    Lack of Transportation (Non-Medical): No  Physical Activity: Not on file  Stress: Not on file  Social Connections: Not on file   Past Surgical History:  Procedure Laterality Date   ESOPHAGEAL DILATION     Past Surgical History:  Procedure Laterality Date   ESOPHAGEAL DILATION     Past Medical History:  Diagnosis Date   COPD (chronic obstructive pulmonary disease) (HCC)    Rib fracture    4 months ago   Stroke [facility])    There were no vitals taken for this visit.  Opioid Risk Score:   Fall Risk Score:  `1  Depression screen [facility] 2/9     04/22/2022   10:08 AM 11/23/2021   10:02 AM 06/28/2021   10:36 AM  Depression screen PHQ 2/9  Decreased Interest 0 0 0  Down, Depressed, Hopeless 0 0 0  PHQ - 2 Score 0 0 0  Altered sleeping   0  Tired, decreased energy   0  Change in appetite   0  Feeling bad or failure about yourself    0  Trouble concentrating   0  Moving slowly or fidgety/restless   0  Suicidal thoughts   0  PHQ-9 Score   0     No family history on file. 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; marijuana daily   Sexual  activity: Not on file  Other Topics Concern   Not on file  Social History Narrative   Not on file   Social Drivers of Health   Financial Resource Strain: Not on file  Food  Insecurity: No Food Insecurity (04/11/2022)   Hunger Vital Sign    Worried About Running Out of Food in the Last Year: Never true    Ran Out of Food in the Last Year: Never true  Transportation Needs: No Transportation Needs (04/11/2022)   PRAPARE - Administrator, Civil Service (Medical): No    Lack of Transportation (Non-Medical): No  Physical Activity: Not on file  Stress: Not on file  Social Connections: Not on file   Past Surgical History:  Procedure Laterality Date   ESOPHAGEAL DILATION     Past Medical History:  Diagnosis Date   COPD (chronic obstructive pulmonary disease) (HCC)    Rib fracture    4 months ago   Stroke [facility])    There were no vitals taken for this visit.  Opioid Risk Score:   Fall Risk Score:  `1  Depression screen [facility] 2/9     04/22/2022   10:08 AM 11/23/2021   10:02 AM 06/28/2021   10:36 AM  Depression screen PHQ 2/9  Decreased Interest 0 0 0  Down, Depressed, Hopeless 0 0 0  PHQ - 2 Score 0 0 0  Altered sleeping   0  Tired, decreased energy   0  Change in appetite   0  Feeling bad or failure about yourself    0  Trouble concentrating   0  Moving slowly or fidgety/restless   0  Suicidal thoughts   0  PHQ-9 Score   0     Review of Systems  Gastrointestinal:  Positive for nausea.  Musculoskeletal:  Positive for gait problem.       Spasms  Neurological:  Positive for weakness and headaches.  All other systems reviewed and are negative.      Objective:   Physical Exam Gen: no distress, normal appearing, 151/96 HEENT: oral mucosa pink and moist, NCAT Cardio: Reg rate Chest: normal effort, normal rate of breathing Abd: soft, non-distended Ext: no edema Psych: pleasant, normal affect Skin: intact Neuro: Alert and oriented, verbose Musculoskeletal:  Ambulating without AD Assessment & Plan:  1) Muscle spasms/spasticity -discussed that this could be a sign of motor recovery.  -continue baclofen  prn. -discussed the risks and benefits of spasticity -can stop tramadol  -continue  bacopa.  -Provided with a pain relief journal and discussed that it contains foods and lifestyle tips to naturally help to improve pain. Discussed that these lifestyle strategies are also very good for health unlike some medications which can have negative side effects. Discussed that the act of keeping a journal can be therapeutic and helpful to realize patterns what helps to trigger and alleviate pain.   -Provided with a pain relief journal and discussed that it contains foods and lifestyle tips to naturally help to improve pain. Discussed that these lifestyle strategies are also very good for health unlike some medications which can have negative side effects. Discussed that the act of keeping a journal can be therapeutic and helpful to realize patterns what helps to trigger and alleviate pain.    2) Left lower extremity weakness: -adjusted her PRAFO  3) SAH: -continue home exercise program -shower chair ordered -reviewed recent MRI with her -discussed that she can restart jogging.  -provided dietary and exercise counseling -discussed that hypertension is number one reversible risk factor for stroke.   -recommended only repeating Head CT if needed, we expect blood to resorb  4) Migraines -Discussed current symptoms of pain and history of pain.  -Discussed benefits of  exercise in reducing pain. -Discussed following foods that may reduce pain: 1) Ginger (especially studied for arthritis)- reduce leukotriene production to decrease inflammation 2) Blueberries- high in phytonutrients that decrease inflammation 3) Salmon- marine omega-3s reduce joint swelling and pain 4) Pumpkin seeds- reduce inflammation 5) dark chocolate- reduces inflammation 6) turmeric-  reduces inflammation 7) tart cherries - reduce pain and stiffness 8) extra virgin olive oil - its compound olecanthal helps to block prostaglandins  9) chili peppers- can be eaten or applied topically via capsaicin 10) mint- helpful for headache, muscle aches, joint pain, and itching 11) garlic- reduces inflammation  Link to further information on diet for chronic pain: [email or web address]  -prescribed Ubrelvy  50mg , discussed that she can use this to break migraine, and if migraine persists after 2 hours, she can take another dose. Discussed that she should not take more than 2 doses in 24 hours -discussed that Ubrelvy  has a safer side effect profile than the Triptans  -discussed that Ubrelvy  has a safer side effect profile than the Triptans given her history of stroke. -continue Fioricet , discussed possible withdrawal headaches with this medication.  -currently Topamax  and Fioricet  are not enough to break her migraines -recommended Magnesium  breakthrough -refilled her Fioricet  this week -d/c topamax  since does not appear to help and she does not want decreased appetite.  -recommended trying use of caffeine  to help break migraines -discussed that food allergen testing could be helpful.   6) Smoking cessation: prescribed Naltrexone .   7) Left lower extremity swelling: -vascular ultrasound ordered and shows no evidence of clot, discussed with patient.   8) Pruritus: -discussed referral to allergy and immunology -continue Benadryl  prn -discussed hydroxyzine .   9) Muscular atrophy: -Prescribed Zynex Nexwave, continue use  10) Nausea: scopolamine  patch ordered  11) HTN: -[place] [address] today.  -Advised checking BP daily at home and logging results to bring into follow-up appointment with PCP and myself. -Reviewed BP meds today.  -Advised regarding healthy foods that can help lower blood pressure and provided with a  list: 1) citrus foods- high in vitamins and minerals 2) salmon and other fatty fish - reduces inflammation and oxylipins 3) swiss chard (leafy green)- high level of nitrates 4) pumpkin seeds- one of the best natural sources of magnesium  5) Beans and lentils- high in fiber, magnesium , and potassium 6) Berries- high in flavonoids 7) Amaranth (whole grain, can be cooked similarly to rice and oats)- high in magnesium  and fiber 8) Pistachios- even more effective at reducing BP than other nuts 9) Carrots- high in phenolic compounds that relax blood vessels and reduce inflammation 10) Celery- contain phthalides that relax tissues of arterial walls 11) Tomatoes- can also improve cholesterol and reduce risk of heart disease 12) Broccoli- good source of magnesium , calcium, and potassium 13) Greek yogurt: high in potassium and calcium 14) Herbs and spices: Celery seed, cilantro, saffron, lemongrass, black cumin, ginseng, cinnamon, cardamom, sweet basil, and ginger 15) Chia and flax seeds- also help to lower cholesterol and blood sugar 16) Beets- high levels of nitrates that relax blood vessels  17) spinach and bananas- high in potassium  -Provided lise of supplements that can help with hypertension:  1) magnesium : one high quality brand is Bioptemizers since it contains all 7 types of magnesium , otherwise over the counter magnesium  gluconate 400mg  is a good option 2) B vitamins 3) vitamin D  4) potassium 5) CoQ10 6) L-arginine 7) Vitamin C 8) Beetroot -Educated that goal BP is 120/80. -Made goal to incorporate some of the above  foods into diet.    12) Soft tissue swelling right thigh: -discussed that seems like a lipoma -discussed risks and benefits of screening tests and she defers at this time.   13) Active smoker -nicotine  patch prescribed

## 2023-06-28 NOTE — Telephone Encounter (Signed)
 Marland Kitchen

## 2023-07-19 ENCOUNTER — Telehealth: Payer: Self-pay | Admitting: Physical Medicine and Rehabilitation

## 2023-07-19 NOTE — Telephone Encounter (Signed)
Dr Carlis Abbott,  Your note doesn't mention Fiorcet or [name], do you want to continue with these medications.

## 2023-07-19 NOTE — Telephone Encounter (Signed)
[facility], [place] [address] [place] please refill [name] and [employer]

## 2023-07-20 ENCOUNTER — Telehealth: Payer: Self-pay | Admitting: Registered Nurse

## 2023-07-20 MED ORDER — BUTALBITAL-APAP-CAFFEINE 50-325-40 MG PO TABS: 1.0000 | ORAL_TABLET | Freq: Four times a day (QID) | ORAL | 0 refills | Status: DC | PRN

## 2023-07-20 MED ORDER — UBRELVY 100 MG PO TABS: 1.0000 | ORAL_TABLET | Freq: Every day | ORAL | 1 refills | Status: DC | PRN

## 2023-07-20 NOTE — Telephone Encounter (Signed)
Was in contact with Dr Yehuda Mao and [facility] prescription sent to the pharmacy.

## 2023-07-21 ENCOUNTER — Telehealth: Payer: Self-pay | Admitting: Registered Nurse

## 2023-07-21 MED ORDER — UBRELVY 100 MG PO TABS: 1.0000 | ORAL_TABLET | Freq: Every day | ORAL | 1 refills | Status: DC | PRN

## 2023-07-21 MED ORDER — BUTALBITAL-APAP-CAFFEINE 50-325-40 MG PO TABS: 1.0000 | ORAL_TABLET | Freq: Four times a day (QID) | ORAL | 0 refills | Status: DC | PRN

## 2023-07-21 NOTE — Telephone Encounter (Signed)
[name] and Fiiorcet sent to [facility].  Call placed to Ms. Carvey regarding the above, she verbalizes understanding. '

## 2023-07-21 NOTE — Telephone Encounter (Signed)
Please sent refill request for [name] to the [facility] in [place]--it was sent to the one in [place].  She doesn't use the [facility] in [place] since she relocated to [place].  Please take that one off her list of pharmacies.

## 2023-08-18 ENCOUNTER — Telehealth: Payer: Self-pay | Admitting: *Deleted

## 2023-08-18 ENCOUNTER — Telehealth: Payer: Self-pay

## 2023-08-18 NOTE — Telephone Encounter (Signed)
 PA for Ubrelvy 100 MG sent to [facility]

## 2023-08-18 NOTE — Telephone Encounter (Addendum)
 [employer] [facility] [place] has reviewed the request for [employer] Tab 100mg  submitted by Monica Miranda on behalf of Monica Miranda on 08/18/2023. After review, the request for service is: Approved through 08/17/2024.  Pharmacy informed.

## 2023-08-18 NOTE — Telephone Encounter (Signed)
 Outcome Denied today by [facility] 2017 NCPDP Request Reference Number: [id number]. UBRELVY TAB 100MG  is denied for not meeting the prior authorization requirement(s). For further questions, call [employer] State at [phone] for more information.

## 2023-09-15 ENCOUNTER — Encounter: Payer: Self-pay | Admitting: Physical Medicine and Rehabilitation

## 2023-09-15 ENCOUNTER — Encounter: Attending: Physical Medicine and Rehabilitation | Admitting: Physical Medicine and Rehabilitation

## 2023-09-15 VITALS — BP 139/90 | HR 66 | Ht 60.0 in | Wt 105.0 lb

## 2023-09-15 DIAGNOSIS — G43909 Migraine, unspecified, not intractable, without status migrainosus: Secondary | ICD-10-CM | POA: Diagnosis not present

## 2023-09-15 DIAGNOSIS — F17200 Nicotine dependence, unspecified, uncomplicated: Secondary | ICD-10-CM | POA: Diagnosis present

## 2023-09-15 DIAGNOSIS — R2241 Localized swelling, mass and lump, right lower limb: Secondary | ICD-10-CM | POA: Diagnosis not present

## 2023-09-15 MED ORDER — HYDROXYZINE HCL 10 MG PO TABS: 10.0000 mg | ORAL_TABLET | Freq: Three times a day (TID) | ORAL | 0 refills | Status: DC | PRN

## 2023-09-15 MED ORDER — BUTALBITAL-APAP-CAFFEINE 50-325-40 MG PO TABS
1.0000 | ORAL_TABLET | Freq: Four times a day (QID) | ORAL | 0 refills | Status: DC | PRN
Start: 2023-09-15 — End: 2023-10-11

## 2023-09-15 NOTE — Progress Notes (Signed)
 Subjective:    Patient ID: Monica Miranda, female    DOB: 07-07-1960, 63 y.o.   MRN: [id number]  HPI  Monica Miranda is a 63 year old woman who presents for f/u of migraines and hemorrhagic stroke, and nausea  1) Hemorrhagic stroke -she asks about repeat Head CT in September -she has been doing well at home -she has not yet returned to jogging but she hopes to -she has been walking [place] a nature trail by her son's home -motor control of left arm continues to be impaired  2) Left ankle pain: -she is waking up at night with pain due to the PRAFO brace, her daughter-in-law adjusted the brace and she feels this caused her to have pain -she brings in the [facility] boot today.  -she wants to wear the [facility] so her foot does not become inverted.  -her cast is digging into the tibia.  -having swelling over the the medical aspct of the ankle.  -still wearing PRAFo at night and asks for how long she should use it for. -she has an appointment with [facility] for new AFO   3) Left upper extremity weakness -strength has improved  4) Muscle spasms -more frequent -she prefers the baclofen over the tramadol. It does make her a little sleepy -does not use baclofen  5) Migraines: -she is having cluster headaches this morning -vary depending on her activity -she has a dog and walks for a mile with him every day- half a mile when the weather is bad -had a bad migraine last night -was not able to eat last night -she is not having as frequent migraines as before, but when she gets them, often the Topamax and Fioricet are not enough to break them -she usually breaks the Fioricet into 1/4 tablet as she does not like to take medications for her pain -she also takes NAC for her migraines and this makes her sleepy -she asks if there is anything else we can give her to help with her migraines -she has been having a lot of family stressors recently and she feels these have worsened the migraines -topamax is not  helping with migraines so she stopped this.  -Fioricet she cures in half twice -she would rather do the NAC -she does not want to try 100% oxygen.  -she was advised to stop her Vitamin D and B12  6) Pruritus: -itching on her hands that spreads around her body -she dug a hole in her foot scratching -the pruritus is miserable -she has been taking Benadryl because she can't sleep -she has bumps on her forehead.   7) Nausea: -nauseous this morning  8) Spasticity: -she asks if this is a goof thing -she is using the TENS unit and likes this -does not feel pain but feels discomfort   Pain Inventory Average Pain 6 Pain Right Now 10 My pain is intermittent, constant, aching, and tingle  In the last 24 hours, has pain interfered with the following? General activity 10 Relation with others 0 Enjoyment of life sometime 5 What TIME of day is your pain at its worst? morning , daytime, evening, and night Sleep (in general) Poor  Pain is worse with: walking, bending, sitting, standing, and some activites Pain improves with: rest, medication, and heat Relief from Meds: 8  History reviewed. No pertinent family history. 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; marijuana daily   Sexual activity: Not on file  Other Topics Concern   Not on file  Social History Narrative   Not on  file   Social Drivers of Health   Financial Resource Strain: Not on file  Food Insecurity: No Food Insecurity (04/11/2022)   Hunger Vital Sign    Worried About Running Out of Food in the Last Year: Never true    Ran Out of Food in the Last Year: Never true  Transportation Needs: No Transportation Needs (04/11/2022)   PRAPARE - Administrator, Civil Service (Medical): No    Lack of Transportation (Non-Medical): No  Physical Activity: Not on file  Stress: Not on file  Social Connections: Not on file   Past Surgical History:  Procedure Laterality Date   ESOPHAGEAL DILATION     Past Surgical History:  Procedure Laterality Date   ESOPHAGEAL DILATION     Past Medical History:  Diagnosis Date   COPD (chronic obstructive pulmonary disease) (HCC)    Rib fracture    4 months ago   Stroke (HCC)    BP (!) 165/84   Pulse 66   Ht 5' (1.524 m)   Wt 105 lb (47.6 kg)   BMI 20.51 kg/m   Opioid Risk Score:   Fall Risk Score:  `1  Depression screen [facility] 2/9     09/15/2023   10:38 AM 06/19/2023   12:52 PM 04/22/2022   10:08 AM 11/23/2021   10:02 AM 06/28/2021   10:36 AM  Depression screen PHQ 2/9  Decreased Interest 0 0 0 0 0  Down, Depressed, Hopeless 0 0 0 0 0  PHQ - 2 Score 0 0 0 0 0  Altered sleeping     0  Tired, decreased energy     0  Change in appetite     0  Feeling bad or failure about yourself      0  Trouble concentrating     0  Moving slowly or fidgety/restless     0  Suicidal thoughts     0  PHQ-9 Score     0     History reviewed. No pertinent family history. 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; marijuana daily   Sexual activity: Not on file  Other Topics Concern   Not on file  Social History Narrative   Not on file   Social Drivers of Health   Financial Resource Strain: Not on file  Food Insecurity: No Food Insecurity (04/11/2022)   Hunger Vital Sign    Worried About Running Out of Food in the Last Year: Never true    Ran Out of Food in the Last Year: Never true  Transportation Needs: No Transportation Needs (04/11/2022)   PRAPARE - Administrator, Civil Service (Medical): No    Lack of Transportation (Non-Medical): No  Physical Activity: Not on file  Stress: Not on file  Social Connections: Not on file   Past Surgical History:  Procedure Laterality Date   ESOPHAGEAL DILATION     Past Medical History:  Diagnosis Date   COPD (chronic obstructive pulmonary disease) (HCC)    Rib fracture    4 months ago   Stroke (HCC)    BP (!) 165/84   Pulse 66   Ht 5' (1.524 m)   Wt 105 lb (47.6 kg)   BMI 20.51 kg/m   Opioid Risk Score:   Fall Risk Score:  `1  Depression screen [facility] 2/9     09/15/2023   10:38 AM 06/19/2023   12:52 PM 04/22/2022   10:08 AM 11/23/2021   10:02 AM 06/28/2021   10:36 AM  Depression screen PHQ 2/9  Decreased Interest 0 0 0 0 0  Down, Depressed, Hopeless 0 0 0 0 0  PHQ - 2 Score 0 0 0 0 0  Altered sleeping     0  Tired, decreased energy     0  Change in appetite     0  Feeling bad or failure about yourself      0  Trouble concentrating     0  Moving slowly or fidgety/restless     0  Suicidal thoughts     0  PHQ-9 Score     0     Review of Systems  Gastrointestinal:  Positive for nausea.  Musculoskeletal:  Positive for gait problem.       Spasms  Neurological:   Positive for weakness and headaches.  All other systems reviewed and are negative.      Objective:   Physical Exam Gen: no distress, normal appearing, 139/90 HEENT: oral mucosa pink and moist, NCAT Cardio: Reg rate Chest: normal effort, normal rate of breathing Abd: soft, non-distended Ext: no edema Psych: pleasant, normal affect Skin: right hip mass Neuro: Alert and oriented, verbose Musculoskeletal: Ambulating without AD Assessment & Plan:  1) Muscle spasms/spasticity -discussed that this could be a sign of motor recovery.  -continue baclofen prn. -discussed the risks and benefits of spasticity -can stop tramadol -continue  bacopa.  -Provided with a pain relief journal and discussed that it contains foods and lifestyle tips to naturally help to improve pain. Discussed that these lifestyle strategies are also very good for health unlike some medications which can have negative side effects. Discussed that the act of keeping a journal can be therapeutic and helpful to realize patterns what helps to trigger and alleviate pain.   -Provided with a pain relief journal and discussed that it contains foods and lifestyle tips to naturally help to improve  pain. Discussed that these lifestyle strategies are also very good for health unlike some medications which can have negative side effects. Discussed that the act of keeping a journal can be therapeutic and helpful to realize patterns what helps to trigger and alleviate pain.    2) Left lower extremity weakness: -adjusted her PRAFO  3) SAH: -continue home exercise program -shower chair ordered -reviewed recent MRI with her -discussed that she can restart jogging.  -provided dietary and exercise counseling -discussed that hypertension is number one reversible risk factor for stroke.   -recommended only repeating Head CT if needed, we expect blood to resorb  4) Migraines -Discussed current symptoms of pain and history of pain.   -Discussed benefits of exercise in reducing pain. -Discussed following foods that may reduce pain: 1) Ginger (especially studied for arthritis)- reduce leukotriene production to decrease inflammation 2) Blueberries- high in phytonutrients that decrease inflammation 3) Salmon- marine omega-3s reduce joint swelling and pain 4) Pumpkin seeds- reduce inflammation 5) dark chocolate- reduces inflammation 6) turmeric- reduces inflammation 7) tart cherries - reduce pain and stiffness 8) extra virgin olive oil - its compound olecanthal helps to block prostaglandins  9) chili peppers- can be eaten or applied topically via capsaicin 10) mint- helpful for headache, muscle aches, joint pain, and itching 11) garlic- reduces inflammation  Link to further information on diet for chronic pain: [email or web address]  -prescribed Ubrelvy 50mg , discussed that she can use this to break migraine, and if migraine persists after 2 hours, she can take another dose. Discussed that she should not take more than 2 doses in 24 hours -discussed that [name] has a safer side effect profile than the Triptans  -discussed that [name] has a safer side effect profile than the Triptans given her history of stroke. -continue Fioricet, discussed possible withdrawal headaches with this medication.  -currently Topamax and Fioricet are not enough to break her migraines -recommended Magnesium breakthrough -refilled her Fioricet this week -d/c topamax since does not appear to help and she does not want decreased appetite.  -recommended trying use of caffeine to help break migraines -discussed that food allergen testing could be helpful.   6) Smoking cessation: prescribed Naltrexone.   7) Left lower extremity swelling: -vascular ultrasound ordered and shows no evidence of clot, discussed with patient.   8) Pruritus: -discussed referral to allergy and  immunology -continue Benadryl prn -discussed hydroxyzine.   9) Muscular atrophy: -Prescribed Zynex Nexwave, continue use  10) Nausea: scopolamine patch ordered  11) HTN: -BP is 139/90 -Advised checking BP daily at home and logging results to bring into follow-up appointment with PCP and myself. -Reviewed BP meds today.  -Advised regarding healthy foods that can help lower blood pressure and provided with a list: 1) citrus foods- high in vitamins and minerals 2) salmon and other fatty fish - reduces inflammation and oxylipins 3) swiss chard (leafy green)- high level of nitrates 4) pumpkin seeds- one of the best natural sources of magnesium 5) Beans and lentils- high in fiber, magnesium, and potassium 6) Berries- high in flavonoids 7) Amaranth (whole grain, can be cooked similarly to rice and oats)- high in magnesium and fiber 8) Pistachios- even more effective at reducing BP than other nuts 9) Carrots- high in phenolic compounds that relax blood vessels and reduce inflammation 10) Celery- contain phthalides that relax tissues of arterial walls 11) Tomatoes- can also improve cholesterol and reduce risk of heart disease 12) Broccoli- good source of magnesium, calcium, and potassium 13) Greek yogurt: high in  potassium and calcium 14) Herbs and spices: Celery seed, cilantro, saffron, lemongrass, black cumin, ginseng, cinnamon, cardamom, sweet basil, and ginger 15) Chia and flax seeds- also help to lower cholesterol and blood sugar 16) Beets- high levels of nitrates that relax blood vessels  17) spinach and bananas- high in potassium  -Provided lise of supplements that can help with hypertension:  1) magnesium: one high quality brand is Bioptemizers since it contains all 7 types of magnesium, otherwise over the counter magnesium gluconate 400mg  is a good option 2) B vitamins 3) vitamin D 4) potassium 5) CoQ10 6) L-arginine 7) Vitamin C 8) Beetroot -Educated that goal BP is  120/80. -Made goal to incorporate some of the above foods into diet.      12) Soft tissue swelling right thigh: -discussed that seems like a lipoma -discussed risks and benefits of screening tests and she defers at this time.   13) Active smoker -stp nicotine patches since they were causing her to crave nicotine  14) Right hip mass: -referred to dermatology -CT right hip ordered  15) Allergies: -discussed that Zyrtec is not covered by Medicaid -prescribed hydroxyzine

## 2023-09-15 NOTE — Patient Instructions (Addendum)
 [employer] that may reduce pain: 1) Ginger (especially studied for arthritis)- reduce leukotriene production to decrease inflammation 2) Blueberries- high in phytonutrients that decrease inflammation 3) Salmon- marine omega-3s reduce joint swelling and pain 4) Pumpkin seeds- reduce inflammation 5) dark chocolate- reduces inflammation 6) turmeric- reduces inflammation 7) tart cherries - reduce pain and stiffness 8) extra virgin olive oil - its compound olecanthal helps to block prostaglandins  9) chili peppers- can be eaten or applied topically via capsaicin 10) mint- helpful for headache, muscle aches, joint pain, and itching 11) garlic- reduces inflammation 12) Green tea- reduces inflammation and oxidative stress, helps with weight loss, may reduce the risk of cancer, recommend Double Green Matcha [place] of Tea daily  Link to further information on diet for chronic pain: [email or web address]   HTN: -Advised checking BP daily at home and logging results to bring into follow-up appointment with PCP and myself. -Reviewed BP meds today.  -Advised regarding healthy foods that can help lower blood pressure and provided with a list: 1) citrus foods- high in vitamins and minerals 2) salmon and other fatty fish - reduces inflammation and oxylipins 3) swiss chard (leafy green)- high level of nitrates 4) pumpkin seeds- one of the best natural sources of magnesium 5) Beans and lentils- high in fiber, magnesium, and potassium 6) Berries- high in flavonoids 7) Amaranth (whole grain, can be cooked similarly to rice and oats)- high in magnesium and fiber 8) Pistachios- even more effective at reducing BP than other nuts 9) Carrots- high in phenolic compounds that relax blood vessels and reduce inflammation 10) Celery- contain phthalides that relax tissues of arterial walls 11) Tomatoes- can also improve  cholesterol and reduce risk of heart disease 12) Broccoli- good source of magnesium, calcium, and potassium 13) Greek yogurt: high in potassium and calcium 14) Herbs and spices: Celery seed, cilantro, saffron, lemongrass, black cumin, ginseng, cinnamon, cardamom, sweet basil, and ginger 15) Chia and flax seeds- also help to lower cholesterol and blood sugar 16) Beets- high levels of nitrates that relax blood vessels  17) spinach and bananas- high in potassium  -Provided lise of supplements that can help with hypertension:  1) magnesium: one high quality brand is Bioptemizers since it contains all 7 types of magnesium, otherwise over the counter magnesium gluconate 400mg  is a good option 2) B vitamins 3) vitamin D 4) potassium 5) CoQ10 6) L-arginine 7) Vitamin C 8) Beetroot -Educated that goal BP is 120/80. -Made goal to incorporate some of the above foods into diet.    Allergies: -bee pollen, local honey

## 2023-09-18 ENCOUNTER — Telehealth: Payer: Self-pay | Admitting: Physical Medicine and Rehabilitation

## 2023-09-18 NOTE — Telephone Encounter (Signed)
 Patient lost papers that you gave her and she needs names of primary doctor's that you recommend.  Also she would like for you to change her pharmacy to [facility] in [place] [facility].

## 2023-09-20 ENCOUNTER — Encounter: Payer: Self-pay | Admitting: *Deleted

## 2023-09-22 ENCOUNTER — Ambulatory Visit (HOSPITAL_COMMUNITY)
Admission: RE | Admit: 2023-09-22 | Discharge: 2023-09-22 | Disposition: A | Discharge status: Home / Self Care | Source: Ambulatory Visit | Attending: Physical Medicine and Rehabilitation

## 2023-09-22 DIAGNOSIS — R2241 Localized swelling, mass and lump, right lower limb: Secondary | ICD-10-CM | POA: Diagnosis present

## 2023-09-25 ENCOUNTER — Telehealth: Payer: Self-pay | Admitting: Physical Medicine and Rehabilitation

## 2023-09-25 ENCOUNTER — Encounter: Payer: Medicaid Other | Admitting: Physical Medicine and Rehabilitation

## 2023-09-25 NOTE — Telephone Encounter (Signed)
 Patient called in requesting CT scan results

## 2023-09-27 NOTE — Telephone Encounter (Signed)
 Attempted to call patient she was not available. No voice ID.Generic message left.

## 2023-10-09 ENCOUNTER — Telehealth: Payer: Self-pay | Admitting: Physical Medicine and Rehabilitation

## 2023-10-09 NOTE — Telephone Encounter (Signed)
 Returning call.

## 2023-10-10 ENCOUNTER — Encounter: Attending: Physical Medicine and Rehabilitation | Admitting: Physical Medicine and Rehabilitation

## 2023-10-10 DIAGNOSIS — F17200 Nicotine dependence, unspecified, uncomplicated: Secondary | ICD-10-CM | POA: Insufficient documentation

## 2023-10-10 DIAGNOSIS — R2241 Localized swelling, mass and lump, right lower limb: Secondary | ICD-10-CM | POA: Insufficient documentation

## 2023-10-10 NOTE — Progress Notes (Addendum)
 Subjective:    Patient ID: Monica Miranda, female    DOB: [birth year], 63 y.o.   MRN: [id number]  HPI  [name] is a 63 year old woman who presents for f/u of migraines and hemorrhagic stroke, and nausea  1) Hemorrhagic stroke -she asks about repeat Head CT in September -she has been doing well at home -she has not yet returned to jogging but she hopes to -she has been walking [place] a nature trail by her son's home -motor control of left arm continues to be impaired  2) Left ankle pain: -she is waking up at night with pain due to the PRAFO brace, her daughter-in-law adjusted the brace and she feels this caused her to have pain -she brings in the [facility] boot today.  -she wants to wear the [facility] so her foot does not become inverted.  -her cast is digging into the tibia.  -having swelling over the the medical aspct of the ankle.  -still wearing PRAFo at night and asks for how long she should use it for. -she has an appointment with [facility] for new AFO   3) Left upper extremity weakness -strength has improved  4) Muscle spasms -more frequent -she prefers the baclofen  over the tramadol . It does make her a little sleepy -does not use baclofen   5) Migraines: -she is having cluster headaches this morning -vary depending on her activity -she has a dog and walks for a mile with him every day- half a mile when the weather is bad -had a bad migraine last night -was not able to eat last night -she is not having as frequent migraines as before, but when she gets them, often the Topamax  and Fioricet  are not enough to break them -she usually breaks the Fioricet  into 1/4 tablet as she does not like to take medications for her pain -she also takes NAC for her migraines and this makes her sleepy -she asks if there is anything else we can give her to help with her migraines -she has been having a lot of family stressors recently and she feels these have worsened the migraines -topamax  is not  helping with migraines so she stopped this.  -Fioricet  she cures in half twice -she would rather do the NAC -she does not want to try 100% oxygen.  -she was advised to stop her Vitamin D  and B12  6) Pruritus: -itching on her hands that spreads around her body -she dug a hole in her foot scratching -the pruritus is miserable -she has been taking Benadryl  because she can't sleep -she has bumps on her forehead.   7) Nausea: -nauseous this morning  8) Spasticity: -she asks if this is a goof thing -she is using the TENS unit and likes this -does not feel pain but feels discomfort  9) Active smoker: -discussed that she is still smoking  10) right hip mass -discussed that she does not have much pain   Pain Inventory Average Pain 6 Pain Right Now 10 My pain is intermittent, constant, aching, and tingle  In the last 24 hours, has pain interfered with the following? General activity 10 Relation with others 0 Enjoyment of life sometime 5 What TIME of day is your pain at its worst? morning , daytime, evening, and night Sleep (in general) Poor  Pain is worse with: walking, bending, sitting, standing, and some activites Pain improves with: rest, medication, and heat Relief from Meds: 8  No family history on file. 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; marijuana daily    Sexual activity: Not on file  Other Topics Concern   Not on file  Social History Narrative   Not on file   Social Drivers of Health   Financial Resource Strain: Not on file  Food Insecurity: No Food Insecurity (04/11/2022)   Hunger Vital Sign    Worried About Running Out of Food in the Last Year: Never true    Ran Out of Food in the Last Year: Never true  Transportation Needs: No Transportation Needs (04/11/2022)   PRAPARE - Administrator, Civil Service (Medical): No    Lack of Transportation (Non-Medical): No  Physical Activity: Not on file  Stress: Not on file  Social Connections: Not on file   Past Surgical History:  Procedure Laterality Date   ESOPHAGEAL DILATION     Past Surgical History:  Procedure Laterality Date   ESOPHAGEAL DILATION     Past Medical History:  Diagnosis Date   COPD (chronic obstructive pulmonary disease) (HCC)    Rib fracture    4 months ago   Stroke [facility])    There were no vitals taken for this visit.  Opioid Risk Score:   Fall Risk Score:  `1  Depression screen [facility] 2/9     09/15/2023   10:38 AM 06/19/2023   12:52 PM 04/22/2022   10:08 AM 11/23/2021   10:02 AM 06/28/2021   10:36 AM  Depression screen PHQ 2/9  Decreased Interest 0 0 0 0 0  Down, Depressed, Hopeless 0 0 0 0 0  PHQ - 2 Score 0 0 0 0 0  Altered sleeping     0  Tired, decreased energy     0  Change in appetite     0  Feeling bad or failure about yourself      0  Trouble concentrating     0  Moving slowly or fidgety/restless     0  Suicidal thoughts     0  PHQ-9 Score     0     No family history on file. 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; marijuana daily   Sexual activity: Not on file  Other Topics Concern   Not on file  Social History Narrative   Not on file   Social Drivers of Health   Financial Resource Strain: Not on file  Food Insecurity: No Food Insecurity (04/11/2022)   Hunger Vital Sign    Worried About Running Out of Food in the Last Year: Never true    Ran Out of Food in the Last Year: Never true  Transportation Needs: No Transportation Needs (04/11/2022)   PRAPARE - Administrator, Civil Service (Medical): No    Lack of Transportation (Non-Medical): No  Physical Activity: Not on file  Stress: Not on file  Social Connections: Not on file   Past Surgical History:  Procedure Laterality Date   ESOPHAGEAL DILATION     Past Medical History:  Diagnosis Date   COPD (chronic obstructive pulmonary disease) (HCC)    Rib fracture    4 months ago   Stroke [facility])    There were no vitals taken for this visit.  Opioid Risk Score:   Fall Risk Score:  `1  Depression screen [facility] 2/9     09/15/2023   10:38 AM 06/19/2023   12:52 PM 04/22/2022   10:08 AM 11/23/2021   10:02 AM 06/28/2021   10:36 AM  Depression screen PHQ 2/9  Decreased Interest 0 0 0 0 0  Down, Depressed, Hopeless 0 0 0 0 0  PHQ - 2 Score 0 0 0 0 0  Altered sleeping     0  Tired, decreased energy     0  Change in appetite     0  Feeling bad or failure about yourself      0  Trouble concentrating     0  Moving slowly or fidgety/restless     0  Suicidal thoughts     0  PHQ-9 Score     0     Review of Systems  Gastrointestinal:  Positive for nausea.  Musculoskeletal:  Positive for gait problem.       Spasms  Neurological:  Positive  for weakness and headaches.  All other systems reviewed and are negative.      Objective:   Physical Exam PRIOR EXAM: Gen: no distress, normal appearing, 139/90 HEENT: oral mucosa pink and moist, NCAT Cardio: Reg rate Chest: normal effort, normal rate of breathing Abd: soft, non-distended Ext: no edema Psych: pleasant, normal affect Skin: right hip mass Neuro: Alert and oriented, verbose Musculoskeletal: Ambulating without AD Assessment & Plan:  1) Muscle spasms/spasticity -discussed that this could be a sign of motor recovery.  -continue baclofen  prn. -discussed the risks and benefits of spasticity -can stop tramadol  -continue  bacopa.  -Provided with a pain relief journal and discussed that it contains foods and lifestyle tips to naturally help to improve pain. Discussed that these lifestyle strategies are also very good for health unlike some medications which can have negative side effects. Discussed that the act of keeping a journal can be therapeutic and helpful to realize patterns what helps to trigger and alleviate pain.   -Provided with a pain relief journal and discussed that it contains foods and lifestyle tips to naturally help to improve pain. Discussed that these lifestyle strategies are also very good for health unlike some medications  which can have negative side effects. Discussed that the act of keeping a journal can be therapeutic and helpful to realize patterns what helps to trigger and alleviate pain.    2) Left lower extremity weakness: -adjusted her PRAFO  3) SAH: -continue home exercise program -shower chair ordered -reviewed recent MRI with her -discussed that she can restart jogging.  -provided dietary and exercise counseling -discussed that hypertension is number one reversible risk factor for stroke.   -recommended only repeating Head CT if needed, we expect blood to resorb  4) Migraines -Discussed current symptoms of pain and history of pain.   -Discussed benefits of exercise in reducing pain. -Discussed following foods that may reduce pain: 1) Ginger (especially studied for arthritis)- reduce leukotriene production to decrease inflammation 2) Blueberries- high in phytonutrients that decrease inflammation 3) Salmon- marine omega-3s reduce joint swelling and pain 4) Pumpkin seeds- reduce inflammation 5) dark chocolate- reduces inflammation 6) turmeric- reduces inflammation 7) tart cherries - reduce pain and stiffness 8) extra virgin olive oil - its compound olecanthal helps to block prostaglandins  9) chili peppers- can be eaten or applied topically via capsaicin 10) mint- helpful for headache, muscle aches, joint pain, and itching 11) garlic- reduces inflammation  Link to further information on diet for chronic pain: [email or web address]  -prescribed Ubrelvy  50mg , discussed that she can use this to break migraine, and if migraine persists after 2 hours, she can take another dose. Discussed that she should not take more than 2 doses in 24 hours -discussed that Ubrelvy  has a safer side effect profile than the Triptans  -discussed that Ubrelvy  has a safer side effect profile than the Triptans given her history of stroke. -continue Fioricet , discussed possible withdrawal headaches with this medication.  -currently Topamax  and Fioricet  are not enough to break her migraines -recommended Magnesium  breakthrough -refilled her Fioricet  this week -d/c topamax  since does not appear to help and she does not want decreased appetite.  -recommended trying use of caffeine  to help break migraines -discussed that food allergen testing could be helpful.   6) Smoking cessation: prescribed Naltrexone .   7) Left lower extremity swelling: -vascular ultrasound ordered and shows no evidence of clot, discussed with patient.   8) Pruritus: -discussed referral to allergy and  immunology -continue Benadryl  prn -discussed hydroxyzine .   9) Muscular atrophy: -Prescribed Zynex Nexwave, continue use  10) Nausea: scopolamine  patch ordered  11) HTN: -BP is 139/90 -Advised checking BP daily at home and logging results to bring into follow-up appointment with PCP and myself. -Reviewed BP meds today.  -Advised regarding healthy foods that can help lower blood pressure and provided with a list: 1) citrus foods- high in vitamins and minerals 2) salmon and other fatty fish - reduces inflammation and oxylipins 3) swiss chard (leafy green)- high level of nitrates 4) pumpkin seeds- one of the best natural sources of magnesium  5) Beans and lentils- high in fiber, magnesium , and potassium 6) Berries- high in flavonoids 7) Amaranth (whole grain, can be cooked similarly to rice and oats)- high in magnesium  and fiber 8) Pistachios- even more effective at reducing BP than other nuts 9) Carrots- high in phenolic compounds that relax blood vessels and reduce inflammation 10) Celery- contain phthalides that relax tissues of arterial walls 11) Tomatoes- can also improve cholesterol and reduce risk of heart disease 12) Broccoli- good source of magnesium , calcium, and potassium 13) Greek yogurt: high in potassium and calcium 14) Herbs and spices: Celery seed, cilantro, saffron, lemongrass, black cumin, ginseng,  cinnamon, cardamom, sweet basil, and ginger 15) Chia and flax seeds- also help to lower cholesterol and blood sugar 16) Beets- high levels of nitrates that relax blood vessels  17) spinach and bananas- high in potassium  -Provided lise of supplements that can help with hypertension:  1) magnesium : one high quality brand is Bioptemizers since it contains all 7 types of magnesium , otherwise over the counter magnesium  gluconate 400mg  is a good option 2) B vitamins 3) vitamin D  4) potassium 5) CoQ10 6) L-arginine 7) Vitamin C 8) Beetroot -Educated that goal BP is  120/80. -Made goal to incorporate some of the above foods into diet.      12) Soft tissue swelling right thigh: -discussed that seems like a lipoma -discussed risks and benefits of screening tests and she defers at this time.   13) Active smoker -stp nicotine  patches since they were causing her to crave nicotine   14) Right hip mass: -referred to dermatology -CT right hip ordered and discussed that this shows no pathology -MRI ordered  15) Allergies: -discussed that Zyrtec is not covered by Medicaid -prescribed hydroxyzine   5 minutes spent in discussion of her right rip pain/mass, that I will order an MRI since the CT did not show any pathology, discussed that she

## 2023-10-10 NOTE — Addendum Note (Signed)
 Addended by: Liam Redhead on: 10/10/2023 03:33 PM   Modules accepted: Orders

## 2023-10-11 ENCOUNTER — Telehealth: Payer: Self-pay

## 2023-10-11 ENCOUNTER — Other Ambulatory Visit: Payer: Self-pay

## 2023-10-11 NOTE — Telephone Encounter (Signed)
 MRI is 10/17/2023 at 1:00 and she needs a Valium sent in because she is claustrophobic.

## 2023-10-12 MED ORDER — UBRELVY 100 MG PO TABS
1.0000 | ORAL_TABLET | Freq: Every day | ORAL | 1 refills | Status: DC | PRN
Start: 2023-10-12 — End: 2023-12-18

## 2023-10-12 MED ORDER — BUTALBITAL-APAP-CAFFEINE 50-325-40 MG PO TABS: 1.0000 | ORAL_TABLET | Freq: Four times a day (QID) | ORAL | 0 refills | Status: DC | PRN

## 2023-10-12 MED ORDER — HYDROXYZINE HCL 10 MG PO TABS: 10.0000 mg | ORAL_TABLET | Freq: Three times a day (TID) | ORAL | 0 refills | Status: AC | PRN

## 2023-10-13 ENCOUNTER — Other Ambulatory Visit: Payer: Self-pay | Admitting: Physical Medicine and Rehabilitation

## 2023-10-13 MED ORDER — DIAZEPAM 2 MG PO TABS: 2.0000 mg | ORAL_TABLET | Freq: Once | ORAL | 0 refills | Status: AC

## 2023-10-17 ENCOUNTER — Ambulatory Visit
Admission: RE | Admit: 2023-10-17 | Discharge: 2023-10-17 | Disposition: A | Discharge status: Home / Self Care | Source: Ambulatory Visit | Attending: Physical Medicine and Rehabilitation | Admitting: Physical Medicine and Rehabilitation

## 2023-10-17 DIAGNOSIS — R2241 Localized swelling, mass and lump, right lower limb: Secondary | ICD-10-CM | POA: Diagnosis present

## 2023-10-24 NOTE — Telephone Encounter (Signed)
 Patient was called to see if her need for this encounter has been met. If not please call back.

## 2023-10-27 ENCOUNTER — Telehealth: Payer: Self-pay | Admitting: *Deleted

## 2023-10-27 NOTE — Telephone Encounter (Signed)
 Information regarding your request We received a prior authorization request for the member and product listed above. The [facility] Prior Authorization Team is not able to review this request because the requested medication has been previously approved under [id number]. Based on the information reviewed, the requested prescription is currently authorized for coverage by the plan until 2024-08-17. Please resubmit this request within 30 days of authorization expiration date.

## 2023-11-16 ENCOUNTER — Encounter: Admitting: Physical Medicine and Rehabilitation

## 2023-11-16 NOTE — Progress Notes (Signed)
 Left patient VM to discuss hip MRI result

## 2023-11-17 ENCOUNTER — Encounter: Attending: Physical Medicine and Rehabilitation | Admitting: Physical Medicine and Rehabilitation

## 2023-11-17 DIAGNOSIS — J449 Chronic obstructive pulmonary disease, unspecified: Secondary | ICD-10-CM | POA: Insufficient documentation

## 2023-11-17 DIAGNOSIS — I610 Nontraumatic intracerebral hemorrhage in hemisphere, subcortical: Secondary | ICD-10-CM | POA: Insufficient documentation

## 2023-11-17 DIAGNOSIS — R531 Weakness: Secondary | ICD-10-CM | POA: Diagnosis not present

## 2023-11-17 MED ORDER — CEPHALEXIN 500 MG PO CAPS: 500.0000 mg | ORAL_CAPSULE | Freq: Two times a day (BID) | ORAL | 0 refills | Status: AC

## 2023-11-21 ENCOUNTER — Telehealth: Payer: Self-pay | Admitting: Physical Medicine and Rehabilitation

## 2023-11-21 NOTE — Telephone Encounter (Signed)
 Patient called in and would like to remove her daughter from her DPR , and has asked to remove her from being her emergency contact . Patient does not want anyone but her self to be informed about her health , until she informs us  otherwise . Informed patient we will need a new DPR on file to revoke the one we had in system

## 2023-11-23 NOTE — Progress Notes (Signed)
 Subjective:    Patient ID: Monica Miranda, female    DOB: 03-03-1961, 63 y.o.   MRN: [id number]  HPI An audio/video tele-health visit is felt to be the most appropriate encounter for this patient at this time. This is a follow up tele-visit via phone. The patient is at home. MD is at office. Prior to scheduling this appointment, our staff discussed the limitations of evaluation and management by telemedicine and the availability of in-person appointments. The patient expressed understanding and agreed to proceed.   Monica Miranda is a 63 year old woman who presents for f/u of migraines and hemorrhagic stroke, and nausea  1) Hemorrhagic stroke -she asks about repeat Head CT in September -she has been doing well at home -she has not yet returned to jogging but she hopes to -she has been walking on a nature trail by her son's home -motor control of left arm continues to be impaired  2) Left ankle pain: -she is waking up at night with pain due to the PRAFO brace, her daughter-in-law adjusted the brace and she feels this caused her to have pain -she brings in the [facility] boot today.  -she wants to wear the [facility] so her foot does not become inverted.  -her cast is digging into the tibia.  -having swelling over the the medical aspct of the ankle.  -still wearing PRAFo at night and asks for how long she should use it for. -she has an appointment with [facility] for new AFO   3) Left upper extremity weakness -strength has improved  4) Muscle spasms -more frequent -she prefers the baclofen  over the tramadol . It does make her a little sleepy -does not use baclofen   5) Migraines: -she is having cluster headaches this morning -vary depending on her activity -she has a dog and walks for a mile with him every day- half a mile when the weather is bad -had a bad migraine last night -was not able to eat last night -she is not having as frequent migraines as before, but when she gets them, often the  Topamax  and Fioricet  are not enough to break them -she usually breaks the Fioricet  into 1/4 tablet as she does not like to take medications for her pain -she also takes NAC for her migraines and this makes her sleepy -she asks if there is anything else we can give her to help with her migraines -she has been having a lot of family stressors recently and she feels these have worsened the migraines -topamax  is not helping with migraines so she stopped this.  -Fioricet  she cures in half twice -she would rather do the NAC -she does not want to try 100% oxygen.  -she was advised to stop her Vitamin D  and B12  6) Pruritus: -itching on her hands that spreads around her body -she dug a hole in her foot scratching -the pruritus is miserable -she has been taking Benadryl  because she can't sleep -she has bumps on her forehead.   7) Nausea: -nauseous this morning  8) Spasticity: -she asks if this is a goof thing -she is using the TENS unit and likes this -does not feel pain but feels discomfort  9) Active smoker: -discussed that she is still smoking  10) right hip mass -discussed that she does not have much pain -discussed findiings of possible cellulitis and that she would like antibiotic treatment as the mass is growing   Pain Inventory Average Pain 6 Pain Right Now 10 My pain  is intermittent, constant, aching, and tingle  In the last 24 hours, has pain interfered with the following? General activity 10 Relation with others 0 Enjoyment of life sometime 5 What TIME of day is your pain at its worst? morning , daytime, evening, and night Sleep (in general) Poor  Pain is worse with: walking, bending, sitting, standing, and some activites Pain improves with: rest, medication, and heat Relief from Meds: 8  No family history on file. 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; marijuana daily   Sexual activity: Not on file  Other Topics Concern   Not on file  Social History Narrative   Not on file   Social Drivers of Health   Financial Resource Strain: Not on file  Food Insecurity: No Food Insecurity (04/11/2022)   Hunger Vital Sign    Worried About Running Out of Food in the Last Year: Never true    Ran Out of Food in the Last Year: Never true  Transportation Needs: No Transportation Needs (04/11/2022)   PRAPARE - Administrator, Civil Service (Medical): No    Lack of Transportation (Non-Medical): No  Physical Activity: Not on file  Stress: Not on file  Social Connections: Not on file   Past Surgical History:  Procedure Laterality Date   ESOPHAGEAL DILATION     Past Surgical History:  Procedure Laterality Date   ESOPHAGEAL DILATION     Past Medical History:  Diagnosis Date   COPD (chronic obstructive pulmonary disease) (HCC)    Rib fracture    4 months ago   Stroke [facility])    There were no vitals taken for this visit.  Opioid Risk Score:   Fall Risk Score:  `1  Depression screen [facility] 2/9     09/15/2023   10:38 AM 06/19/2023   12:52 PM 04/22/2022   10:08 AM 11/23/2021   10:02 AM 06/28/2021   10:36 AM  Depression screen PHQ 2/9  Decreased Interest 0 0 0 0 0  Down, Depressed, Hopeless 0 0 0 0 0  PHQ - 2 Score 0 0 0 0 0  Altered sleeping     0  Tired, decreased energy     0  Change in appetite     0  Feeling bad or failure about  yourself      0  Trouble concentrating     0  Moving slowly or fidgety/restless     0  Suicidal thoughts     0  PHQ-9 Score     0     No family history on file. 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; marijuana daily   Sexual activity: Not on file  Other Topics Concern   Not on file  Social History Narrative   Not on file   Social Drivers of Health   Financial Resource Strain: Not on file  Food Insecurity: No Food Insecurity (04/11/2022)   Hunger Vital Sign    Worried About Running Out of Food in the Last Year: Never true    Ran Out of Food in the Last Year: Never true  Transportation Needs: No Transportation Needs (04/11/2022)   PRAPARE - Administrator, Civil Service (Medical): No    Lack of Transportation (Non-Medical): No  Physical Activity: Not on file  Stress: Not on file  Social Connections: Not on file   Past Surgical History:  Procedure Laterality Date   ESOPHAGEAL DILATION     Past Medical History:  Diagnosis Date   COPD (chronic obstructive pulmonary disease) (HCC)    Rib fracture    4 months ago   Stroke [facility])    There were no vitals taken for this visit.  Opioid Risk Score:   Fall Risk Score:  `1  Depression screen [facility] 2/9     09/15/2023   10:38 AM 06/19/2023   12:52 PM 04/22/2022   10:08 AM 11/23/2021   10:02 AM 06/28/2021   10:36  AM  Depression screen PHQ 2/9  Decreased Interest 0 0 0 0 0  Down, Depressed, Hopeless 0 0 0 0 0  PHQ - 2 Score 0 0 0 0 0  Altered sleeping     0  Tired, decreased energy     0  Change in appetite     0  Feeling bad or failure about yourself      0  Trouble concentrating     0  Moving slowly or fidgety/restless     0  Suicidal thoughts     0  PHQ-9 Score     0     Review of Systems  Gastrointestinal:  Positive for nausea.  Musculoskeletal:  Positive for gait problem.       Spasms  Neurological:  Positive for weakness and headaches.  All other systems reviewed and are negative.      Objective:   Physical Exam PRIOR EXAM: Gen: no distress, normal appearing, 139/90 HEENT: oral mucosa pink and moist, NCAT Cardio: Reg rate Chest: normal effort, normal rate of breathing Abd: soft, non-distended Ext: no edema Psych: pleasant, normal affect Skin: right hip mass Neuro: Alert and oriented, verbose Musculoskeletal: Ambulating without AD Assessment & Plan:  1) Muscle spasms/spasticity -discussed that this could be a sign of motor recovery.  -continue baclofen  prn. -discussed the risks and benefits of spasticity -can stop tramadol  -continue  bacopa.  -Provided with a pain relief journal and discussed that it contains foods and lifestyle tips to  naturally help to improve pain. Discussed that these lifestyle strategies are also very good for health unlike some medications which can have negative side effects. Discussed that the act of keeping a journal can be therapeutic and helpful to realize patterns what helps to trigger and alleviate pain.   -Provided with a pain relief journal and discussed that it contains foods and lifestyle tips to naturally help to improve pain. Discussed that these lifestyle strategies are also very good for health unlike some medications which can have negative side effects. Discussed that the act of keeping a journal can be therapeutic and helpful to realize  patterns what helps to trigger and alleviate pain.    2) Left lower extremity weakness: -adjusted her PRAFO  3) SAH: -continue home exercise program -shower chair ordered -reviewed recent MRI with her -discussed that she can restart jogging.  -provided dietary and exercise counseling -discussed that hypertension is number one reversible risk factor for stroke.   -recommended only repeating Head CT if needed, we expect blood to resorb  4) Migraines -Discussed current symptoms of pain and history of pain.  -Discussed benefits of exercise in reducing pain. -Discussed following foods that may reduce pain: 1) Ginger (especially studied for arthritis)- reduce leukotriene production to decrease inflammation 2) Blueberries- high in phytonutrients that decrease inflammation 3) Salmon- marine omega-3s reduce joint swelling and pain 4) Pumpkin seeds- reduce inflammation 5) dark chocolate- reduces inflammation 6) turmeric- reduces inflammation 7) tart cherries - reduce pain and stiffness 8) extra virgin olive oil - its compound olecanthal helps to block prostaglandins  9) chili peppers- can be eaten or applied topically via capsaicin 10) mint- helpful for headache, muscle aches, joint pain, and itching 11) garlic- reduces inflammation  Link to further information on diet for chronic pain: [email or web address]  -prescribed Ubrelvy  50mg , discussed that she can use this to break migraine, and if migraine persists after 2 hours, she can take another dose. Discussed that she should not take more than 2 doses in 24 hours -discussed that Ubrelvy  has a safer side effect profile than the Triptans  -discussed that Ubrelvy  has a safer side effect profile than the Triptans given her history of stroke. -continue Fioricet , discussed possible withdrawal headaches with this medication.  -currently Topamax  and Fioricet  are not enough to  break her migraines -recommended Magnesium  breakthrough -refilled her Fioricet  this week -d/c topamax  since does not appear to help and she does not want decreased appetite.  -recommended trying use of caffeine  to help break migraines -discussed that food allergen testing could be helpful.   6) Smoking cessation: prescribed Naltrexone .   7) Left lower extremity swelling: -vascular ultrasound ordered and shows no evidence of clot, discussed with patient.   8) Pruritus: -discussed referral to allergy and immunology -continue Benadryl  prn -discussed hydroxyzine .   9) Muscular atrophy: -Prescribed Zynex Nexwave, continue use  10) Nausea: scopolamine  patch ordered  11) HTN: -BP is 139/90 -Advised checking BP daily at home and logging results to bring into follow-up appointment with PCP and myself. -Reviewed BP meds today.  -Advised regarding healthy foods that can help lower blood pressure and provided with a list: 1) citrus foods- high in vitamins and minerals 2) salmon and other fatty fish - reduces inflammation and oxylipins 3) swiss chard (leafy green)- high level of nitrates 4) pumpkin seeds- one of the best natural sources of magnesium  5) Beans and lentils- high in fiber, magnesium , and potassium 6) Berries- high in flavonoids 7) Amaranth (whole grain, can be cooked  similarly to rice and oats)- high in magnesium  and fiber 8) Pistachios- even more effective at reducing BP than other nuts 9) Carrots- high in phenolic compounds that relax blood vessels and reduce inflammation 10) Celery- contain phthalides that relax tissues of arterial walls 11) Tomatoes- can also improve cholesterol and reduce risk of heart disease 12) Broccoli- good source of magnesium , calcium, and potassium 13) Greek yogurt: high in potassium and calcium 14) Herbs and spices: Celery seed, cilantro, saffron, lemongrass, black cumin, ginseng, cinnamon, cardamom, sweet basil, and ginger 15) Chia and flax  seeds- also help to lower cholesterol and blood sugar 16) Beets- high levels of nitrates that relax blood vessels  17) spinach and bananas- high in potassium  -Provided lise of supplements that can help with hypertension:  1) magnesium : one high quality brand is Bioptemizers since it contains all 7 types of magnesium , otherwise over the counter magnesium  gluconate 400mg  is a good option 2) B vitamins 3) vitamin D  4) potassium 5) CoQ10 6) L-arginine 7) Vitamin C 8) Beetroot -Educated that goal BP is 120/80. -Made goal to incorporate some of the above foods into diet.      12) Soft tissue swelling right thigh: -discussed that seems like a lipoma -discussed risks and benefits of screening tests and she defers at this time.   13) Active smoker -stp nicotine  patches since they were causing her to crave nicotine   14) Right hip mass: -referred to dermatology -discussed her right hip mass, referred to internal medicine, prescribed Keflex , discussed MRI findings of possible cellulitis and labral tear  15) Allergies: -discussed that Zyrtec is not covered by Medicaid -prescribed hydroxyzine   5 minutes spent in discussion of her right hip mass, referred to internal medicine, prescribed Keflex , discussed MRI findings of possible cellulitis and labral tear

## 2023-12-18 ENCOUNTER — Encounter: Admitting: Physical Medicine and Rehabilitation

## 2023-12-18 ENCOUNTER — Other Ambulatory Visit: Payer: Self-pay | Admitting: Physical Medicine and Rehabilitation

## 2023-12-31 ENCOUNTER — Other Ambulatory Visit: Payer: Self-pay | Admitting: Physical Medicine and Rehabilitation

## 2024-01-29 ENCOUNTER — Other Ambulatory Visit: Payer: Self-pay | Admitting: Physical Medicine and Rehabilitation

## 2024-02-13 ENCOUNTER — Ambulatory Visit: Admitting: Physical Medicine and Rehabilitation

## 2024-03-05 ENCOUNTER — Telehealth: Payer: Self-pay | Admitting: *Deleted

## 2024-03-05 MED ORDER — BUTALBITAL-APAP-CAFFEINE 50-325-40 MG PO TABS: 1.0000 | ORAL_TABLET | Freq: Four times a day (QID) | ORAL | 0 refills | Status: AC | PRN

## 2024-03-05 MED ORDER — BUTALBITAL-APAP-CAFFEINE 50-325-40 MG PO TABS: 1.0000 | ORAL_TABLET | Freq: Four times a day (QID) | ORAL | 0 refills | Status: DC | PRN

## 2024-03-05 NOTE — Telephone Encounter (Signed)
 Refilled for 1 month.

## 2024-03-05 NOTE — Telephone Encounter (Signed)
 Monica Miranda called for a refill on her fiorcet.

## 2024-03-22 ENCOUNTER — Encounter: Admitting: Physical Medicine and Rehabilitation

## 2024-04-16 ENCOUNTER — Encounter: Payer: Self-pay | Attending: Physical Medicine and Rehabilitation | Admitting: Physical Medicine and Rehabilitation

## 2024-05-09 ENCOUNTER — Telehealth: Payer: Self-pay

## 2024-05-10 NOTE — Telephone Encounter (Signed)
 Dr. Lorilee advised of patient calling regarding work restrictions.  Per Dr. Lorilee, provider will call patient to discuss in further details.

## 2024-06-27 NOTE — Progress Notes (Unsigned)
 "  Subjective:    Patient ID: Monica Miranda, female    DOB: Sep 26, 1960, 64 y.o.   MRN: [id number]  HPI   Pain Inventory Average Pain {NUMBERS; 0-10:5044} Pain Right Now {NUMBERS; 0-10:5044} My pain is {PAIN DESCRIPTION:21022940}  In the last 24 hours, has pain interfered with the following? General activity {NUMBERS; 0-10:5044} Relation with others {NUMBERS; 0-10:5044} Enjoyment of life {NUMBERS; 0-10:5044} What TIME of day is your pain at its worst? {time of day:24191} Sleep (in general) {BHH GOOD/FAIR/POOR:22877}  Pain is worse with: {ACTIVITIES:21022942} Pain improves with: {PAIN IMPROVES TPUY:78977056} Relief from Meds: {NUMBERS; 0-10:5044}  No family history on file. 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; marijuana daily   Sexual activity: Not on file  Other Topics Concern   Not on file  Social History Narrative   Not on file   Social Drivers of Health   Tobacco Use: High Risk (09/15/2023)   Patient History    Smoking Tobacco Use: Every Day    Smokeless Tobacco Use: Never    Passive Exposure: Not on file  Financial Resource Strain: Not on file  Food Insecurity: No Food Insecurity (04/11/2022)   Hunger Vital Sign    Worried About Running Out of Food in the Last Year: Never true    Ran Out of Food in the Last Year: Never true  Transportation Needs: No  Transportation Needs (04/11/2022)   PRAPARE - Administrator, Civil Service (Medical): No    Lack of Transportation (Non-Medical): No  Physical Activity: Not on file  Stress: Not on file  Social Connections: Not on file  Depression (PHQ2-9): Low Risk (09/15/2023)   Depression (PHQ2-9)    PHQ-2 Score: 0  Alcohol Screen: Not on file  Housing: Low Risk (04/11/2022)   Housing    Last Housing Risk Score: 0  Utilities: Not At Risk (04/11/2022)   AHC Utilities    Threatened with loss of utilities: No  Health Literacy: Not on file   Past Surgical History:  Procedure Laterality Date   ESOPHAGEAL DILATION     Past Surgical History:  Procedure Laterality Date   ESOPHAGEAL DILATION     Past Medical History:  Diagnosis Date   COPD (chronic obstructive pulmonary disease) (HCC)    Rib fracture    4 months ago   Stroke (HCC)    There were no vitals taken for this visit.  Opioid Risk Score:   Fall Risk Score:  `1  Depression screen [facility] 2/9     09/15/2023   10:38 AM 06/19/2023   12:52 PM 04/22/2022   10:08 AM 11/23/2021   10:02 AM 06/28/2021   10:36 AM  Depression screen PHQ 2/9  Decreased Interest 0 0 0 0  0  Down, Depressed, Hopeless 0 0 0 0 0  PHQ - 2 Score 0 0 0 0 0  Altered sleeping     0  Tired, decreased energy     0  Change in appetite     0  Feeling bad or failure about yourself      0  Trouble concentrating     0  Moving slowly or fidgety/restless     0  Suicidal thoughts     0  PHQ-9 Score     0      Data saved with a previous flowsheet row definition    Review of Systems     Objective:   Physical Exam        Assessment & Plan:    "

## 2024-06-28 ENCOUNTER — Encounter: Payer: Self-pay | Admitting: Physical Medicine and Rehabilitation

## 2024-06-28 ENCOUNTER — Encounter: Payer: Self-pay | Attending: Physical Medicine and Rehabilitation | Admitting: Physical Medicine and Rehabilitation

## 2024-06-28 VITALS — BP 172/89 | HR 65 | Ht 60.0 in | Wt 99.6 lb

## 2024-06-28 DIAGNOSIS — R2241 Localized swelling, mass and lump, right lower limb: Secondary | ICD-10-CM | POA: Diagnosis not present

## 2024-06-28 DIAGNOSIS — R252 Cramp and spasm: Secondary | ICD-10-CM | POA: Diagnosis not present

## 2024-06-28 DIAGNOSIS — I69398 Other sequelae of cerebral infarction: Secondary | ICD-10-CM | POA: Insufficient documentation

## 2024-06-28 DIAGNOSIS — I10 Essential (primary) hypertension: Secondary | ICD-10-CM | POA: Diagnosis not present

## 2024-06-28 DIAGNOSIS — I610 Nontraumatic intracerebral hemorrhage in hemisphere, subcortical: Secondary | ICD-10-CM | POA: Diagnosis not present

## 2024-06-28 MED ORDER — PROPRANOLOL HCL 10 MG PO TABS: 10.0000 mg | ORAL_TABLET | Freq: Three times a day (TID) | ORAL | 3 refills | Status: AC

## 2024-06-28 MED ORDER — NALTREXONE HCL 50 MG PO TABS: 25.0000 mg | ORAL_TABLET | Freq: Every day | ORAL | 3 refills | Status: AC

## 2024-06-28 NOTE — Patient Instructions (Signed)
 Monica Miranda

## 2024-06-28 NOTE — Progress Notes (Signed)
 "  Subjective:    Patient ID: Creighton DELENA Sharps, female    DOB: March 30, 1961, 64 y.o.   MRN: [id number]  HPI  Mrs. Wisner is a 64 year old woman who presents for f/u of migraines and hemorrhagic stroke, and nausea  1) Hemorrhagic stroke -she would like to return to work.  -she is unable to lift more than 15 lbs due to her ICH -she asks about repeat Head CT in September -she has been doing well at home -she has not yet returned to jogging but she hopes to -she has been walking [place] a nature trail by her son's home -motor control of left arm continues to be impaired  2) Left ankle pain: -she is waking up at night with pain due to the PRAFO brace, her daughter-in-law adjusted the brace and she feels this caused her to have pain -she brings in the [facility] boot today.  -she wants to wear the [facility] so her foot does not become inverted.  -her cast is digging into the tibia.  -having swelling over the the medical aspct of the ankle.  -still wearing PRAFo at night and asks for how long she should use it for. -she has an appointment with [facility] for new AFO   3) Left upper extremity weakness -strength has improved  4) Muscle spasms -more frequent -she prefers the baclofen  over the tramadol . It does make her a little sleepy -does not use baclofen   5) Migraines: -she is having cluster headaches this morning -vary depending on her activity -she has a dog and walks for a mile with him every day- half a mile when the weather is bad -had a bad migraine last night -was not able to eat last night -she is not having as frequent migraines as before, but when she gets them, often the Topamax  and Fioricet  are not enough to break them -she usually breaks the Fioricet  into 1/4 tablet as she does not like to take medications for her pain -she also takes NAC for her migraines and this makes her sleepy -she asks if there is anything else we can give her to help with her migraines -she has been having a lot of  family stressors recently and she feels these have worsened the migraines -topamax  is not helping with migraines so she stopped this.  -Fioricet  she cures in half twice -she would rather do the NAC -she does not want to try 100% oxygen.  -she was advised to stop her Vitamin D  and B12  6) Pruritus: -itching on her hands that spreads around her body -she dug a hole in her foot scratching -the pruritus is miserable -she has been taking Benadryl  because she can't sleep -she has bumps on her forehead.   7) Nausea: -nauseous this morning  8) Spasticity: -she asks if this is a goof thing -she is using the TENS unit and likes this -does not feel pain but feels discomfort  9) Active smoker: -discussed that she is still smoking  10) right hip mass -discussed that she does not have much pain -getting larger -hurts to sleep on her right side   Pain Inventory Average Pain 4 Pain Right Now 4 My pain is intermittent, constant, aching, and tingle  In the last 24 hours, has pain interfered with the following? General activity 10 Relation with others 0 Enjoyment of life sometime 0 What TIME of day is your pain at its worst? morning , daytime, evening, and night Sleep (in general) Poor  Pain  is worse with: walking, bending, sitting, standing, and some activites Pain improves with: rest, medication, and heat Relief from Meds: 8  History reviewed. No pertinent family history. 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; marijuana daily   Sexual activity: Not on file  Other Topics Concern   Not on file  Social History Narrative   Not on file   Social Drivers of Health   Tobacco Use: High Risk (09/15/2023)   Patient History    Smoking Tobacco Use: Every Day    Smokeless Tobacco Use: Never    Passive Exposure: Not on file  Financial Resource Strain: Not on file  Food Insecurity: No Food Insecurity (04/11/2022)   Hunger Vital Sign    Worried About Running Out of Food in the Last Year: Never true    Ran Out of Food in the Last Year: Never true  Transportation Needs: No Transportation Needs (04/11/2022)   PRAPARE - Administrator, Civil Service (Medical): No    Lack of Transportation (Non-Medical): No  Physical Activity: Not on file  Stress: Not on file  Social Connections: Not on file  Depression (PHQ2-9): Low Risk (06/28/2024)   Depression (PHQ2-9)    PHQ-2 Score: 2  Alcohol Screen: Not on file  Housing: Low Risk (04/11/2022)   Housing    Last Housing Risk Score: 0  Utilities: Not At Risk (04/11/2022)   AHC Utilities    Threatened with loss of utilities: No  Health Literacy: Not on file   Past Surgical History:  Procedure Laterality Date   ESOPHAGEAL DILATION     Past Surgical History:  Procedure Laterality Date   ESOPHAGEAL DILATION     Past Medical History:  Diagnosis Date   COPD (chronic obstructive pulmonary disease) (HCC)    Rib fracture    4 months ago   Stroke (HCC)    BP (!) 163/87   Pulse 65   Ht 5' (1.524 m)   Wt 99 lb 9.6 oz (45.2 kg)   SpO2 97%   BMI 19.45 kg/m   Opioid Risk Score:   Fall Risk Score:  `1  Depression screen [facility] 2/9     06/28/2024   10:35 AM 09/15/2023   10:38 AM 06/19/2023   12:52 PM 04/22/2022   10:08 AM 11/23/2021   10:02 AM 06/28/2021   10:36 AM  Depression screen PHQ 2/9  Decreased  Interest 1 0 0 0 0 0  Down, Depressed, Hopeless 1 0 0 0 0 0  PHQ - 2 Score 2 0 0 0 0 0  Altered sleeping      0  Tired, decreased energy      0  Change in appetite      0  Feeling bad or failure about yourself       0  Trouble concentrating      0  Moving slowly or fidgety/restless      0  Suicidal thoughts      0  PHQ-9 Score      0      Data saved with a previous flowsheet row definition     History reviewed. No pertinent family history. 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; marijuana daily   Sexual activity: Not on file  Other Topics Concern   Not on file  Social History Narrative   Not on file   Social Drivers of Health   Tobacco Use: High Risk (09/15/2023)   Patient History    Smoking Tobacco Use: Every Day    Smokeless Tobacco Use: Never    Passive Exposure: Not on file  Financial Resource Strain: Not on file  Food Insecurity: No Food Insecurity (04/11/2022)   Hunger Vital Sign    Worried About Running Out of Food in the Last Year: Never true    Ran Out of Food in the Last Year: Never true  Transportation Needs: No Transportation Needs (04/11/2022)   PRAPARE - Administrator, Civil Service (Medical): No    Lack of Transportation (Non-Medical): No  Physical Activity: Not on file  Stress: Not on file  Social Connections: Not on file  Depression  (PHQ2-9): Low Risk (06/28/2024)   Depression (PHQ2-9)    PHQ-2 Score: 2  Alcohol Screen: Not on file  Housing: Low Risk (04/11/2022)   Housing    Last Housing Risk Score: 0  Utilities: Not At Risk (04/11/2022)   AHC Utilities    Threatened with loss of utilities: No  Health Literacy: Not on file   Past Surgical History:  Procedure Laterality Date   ESOPHAGEAL DILATION     Past Medical History:  Diagnosis Date   COPD (chronic obstructive pulmonary disease) (HCC)    Rib fracture    4 months ago   Stroke (HCC)    BP (!) 163/87   Pulse 65   Ht 5' (1.524 m)   Wt 99 lb 9.6 oz (45.2 kg)   SpO2 97%   BMI 19.45 kg/m   Opioid Risk Score:   Fall Risk Score:  `1  Depression screen [facility] 2/9     06/28/2024   10:35 AM 09/15/2023   10:38 AM 06/19/2023   12:52 PM 04/22/2022   10:08 AM 11/23/2021   10:02 AM 06/28/2021   10:36 AM  Depression screen PHQ 2/9  Decreased Interest 1 0 0 0 0 0  Down, Depressed, Hopeless 1 0 0 0 0 0  PHQ - 2 Score 2 0 0 0 0  0  Altered sleeping      0  Tired, decreased energy      0  Change in appetite      0  Feeling bad or failure about yourself       0  Trouble concentrating      0  Moving slowly or fidgety/restless      0  Suicidal thoughts      0  PHQ-9 Score      0      Data saved with a previous flowsheet row definition     Review of Systems  Gastrointestinal:  Positive for nausea.  Musculoskeletal:  Positive for gait problem.       Left leg pain  Neurological:  Positive for weakness and headaches.  All other systems reviewed and are negative.      Objective:   Physical Exam Gen: no distress, normal appearing, 139/90 HEENT: oral mucosa pink and moist, NCAT Cardio: Reg rate Chest: normal effort, normal rate of breathing Abd: soft, non-distended Ext: no edema Psych: pleasant, normal affect Skin: right hip mass Neuro: Alert and oriented, verbose Musculoskeletal: Ambulating without AD, full body tremor- patient states this is due to  pain Assessment & Plan:  1) Muscle spasms/spasticity -discussed that this could be a sign of motor recovery.  -continue baclofen  prn. -discussed the risks and benefits of spasticity -can stop tramadol  -continue  bacopa.  -Provided with a pain relief journal and discussed that it contains foods and lifestyle tips to naturally help to improve pain. Discussed that these lifestyle strategies are also very good for health unlike some medications which can have negative side effects. Discussed that the act of keeping a journal can be therapeutic and helpful to realize patterns what helps to trigger and alleviate pain.   -Provided with a pain relief journal and discussed that it contains foods and lifestyle tips to naturally help to improve pain. Discussed that these lifestyle strategies are also very good for health unlike some medications which can have negative side effects. Discussed that the act of keeping a journal can be therapeutic and helpful to realize patterns what helps to trigger and alleviate pain.    2) Left lower extremity weakness: -adjusted her PRAFO  3) SAH: -referred to neurology -continue home exercise program -shower chair ordered -reviewed recent MRI with her -discussed that she can restart jogging.  -provided dietary and exercise counseling -discussed that hypertension is number one reversible risk factor for stroke.   -recommended only repeating Head CT if needed, we expect blood to resorb  4) Migraines -Discussed current symptoms of pain and history of pain.  -Discussed benefits of exercise in reducing pain. -Discussed following foods that may reduce pain: 1) Ginger (especially studied for arthritis)- reduce leukotriene production to decrease inflammation 2) Blueberries- high in phytonutrients that decrease inflammation 3) Salmon- marine omega-3s reduce joint swelling and pain 4) Pumpkin seeds- reduce inflammation 5) dark chocolate- reduces inflammation 6)  turmeric- reduces inflammation 7) tart cherries - reduce pain and stiffness 8) extra virgin olive oil - its compound olecanthal helps to block prostaglandins  9) chili peppers- can be eaten or applied topically via capsaicin 10) mint- helpful for headache, muscle aches, joint pain, and itching 11) garlic- reduces inflammation  Link to further information on diet for chronic pain: [email or web address]  -prescribed Ubrelvy  50mg , discussed that she can use this to break migraine, and if migraine persists after 2 hours, she can take another dose. Discussed that she should not take more  than 2 doses in 24 hours -discussed that Ubrelvy  has a safer side effect profile than the Triptans  -discussed that Ubrelvy  has a safer side effect profile than the Triptans given her history of stroke. -continue Fioricet , discussed possible withdrawal headaches with this medication.  -currently Topamax  and Fioricet  are not enough to break her migraines -recommended Magnesium  breakthrough -refilled her Fioricet  this week -d/c topamax  since does not appear to help and she does not want decreased appetite.  -recommended trying use of caffeine  to help break migraines -discussed that food allergen testing could be helpful.   6) Smoking cessation: prescribed Naltrexone .   7) Left lower extremity swelling: -vascular ultrasound ordered and shows no evidence of clot, discussed with patient.   8) Pruritus: -discussed referral to allergy and immunology -continue Benadryl  prn -discussed hydroxyzine .   9) Muscular atrophy: -Prescribed Zynex Nexwave, continue use  10) Nausea: scopolamine  patch ordered  11) HTN: -BP is 139/90 -Advised checking BP daily at home and logging results to bring into follow-up appointment with PCP and myself. -Reviewed BP meds today.  -Advised regarding healthy foods that can help lower blood pressure and provided with  a list: 1) citrus foods- high in vitamins and minerals 2) salmon and other fatty fish - reduces inflammation and oxylipins 3) swiss chard (leafy green)- high level of nitrates 4) pumpkin seeds- one of the best natural sources of magnesium  5) Beans and lentils- high in fiber, magnesium , and potassium 6) Berries- high in flavonoids 7) Amaranth (whole grain, can be cooked similarly to rice and oats)- high in magnesium  and fiber 8) Pistachios- even more effective at reducing BP than other nuts 9) Carrots- high in phenolic compounds that relax blood vessels and reduce inflammation 10) Celery- contain phthalides that relax tissues of arterial walls 11) Tomatoes- can also improve cholesterol and reduce risk of heart disease 12) Broccoli- good source of magnesium , calcium, and potassium 13) Greek yogurt: high in potassium and calcium 14) Herbs and spices: Celery seed, cilantro, saffron, lemongrass, black cumin, ginseng, cinnamon, cardamom, sweet basil, and ginger 15) Chia and flax seeds- also help to lower cholesterol and blood sugar 16) Beets- high levels of nitrates that relax blood vessels  17) spinach and bananas- high in potassium  -Provided lise of supplements that can help with hypertension:  1) magnesium : one high quality brand is Bioptemizers since it contains all 7 types of magnesium , otherwise over the counter magnesium  gluconate 400mg  is a good option 2) B vitamins 3) vitamin D  4) potassium 5) CoQ10 6) L-arginine 7) Vitamin C 8) Beetroot -Educated that goal BP is 120/80. -Made goal to incorporate some of the above foods into diet.      12) Soft tissue swelling right thigh: -discussed that seems like a lipoma -discussed risks and benefits of screening tests and she defers at this time.   13) Active smoker -stp nicotine  patches since they were causing her to crave nicotine   14) Right hip mass: -referred to dermatology -CT right hip ordered and discussed that this shows no  pathology -MRI ordered discussed results with patient, recommended following up with PCP regarding this -discussed that when she lays on this side she feels pain  15) Allergies: -discussed that Zyrtec is not covered by Medicaid -prescribed hydroxyzine   16) HTN -discussed her current diet -propanolol 10mg  ordered for for blood pressure ans well as for her full body tremor -advised establishing care with a PCP -Advised checking BP daily at home and logging results to bring into follow-up appointment  with PCP and myself. -Reviewed BP meds today.  -Advised regarding healthy foods that can help lower blood pressure and provided with a list: 1) citrus foods- high in vitamins and minerals 2) salmon and other fatty fish - reduces inflammation and oxylipins 3) swiss chard (leafy green)- high level of nitrates 4) pumpkin seeds- one of the best natural sources of magnesium  5) Beans and lentils- high in fiber, magnesium , and potassium 6) Berries- high in flavonoids 7) Amaranth (whole grain, can be cooked similarly to rice and oats)- high in magnesium  and fiber 8) Pistachios- even more effective at reducing BP than other nuts 9) Carrots- high in phenolic compounds that relax blood vessels and reduce inflammation 10) Celery- contain phthalides that relax tissues of arterial walls 11) Tomatoes- can also improve cholesterol and reduce risk of heart disease 12) Broccoli- good source of magnesium , calcium, and potassium 13) Greek yogurt: high in potassium and calcium 14) Herbs and spices: Celery seed, cilantro, saffron, lemongrass, black cumin, ginseng, cinnamon, cardamom, sweet basil, and ginger 15) Chia and flax seeds- also help to lower cholesterol and blood sugar 16) Beets- high levels of nitrates that relax blood vessels  17) spinach and bananas- high in potassium  -Provided lise of supplements that can help with hypertension:  1) magnesium : one high quality brand is Bioptemizers since it  contains all 7 types of magnesium , otherwise over the counter magnesium  gluconate 400mg  is a good option 2) B vitamins 3) vitamin D  4) potassium 5) CoQ10 6) L-arginine 7) Vitamin C 8) Beetroot -Educated that goal BP is 120/80. -Made goal to incorporate some of the above foods into diet.   40 minutes spent in discussion of her continued smoking, discussed that this could be raising her blood pressure, discussed that we can try naltrexone  for her, discussed that this can cause nausea, recommended Saddie Sacks as a PCP and discussed that she is accepting new patients, referred to neurology for new full body tremor, discussed that she feels this is due to pain, discussed that we can try propanolol to help with her tremor and that it may also help her hypertension, discussed that she mostly eats fruits and seafood, discussed that she continues to have a mass on her right hip, reviewed MRI results with her, provided another referral to dermatology since she never heard from them last time "

## 2024-07-10 ENCOUNTER — Telehealth: Payer: Self-pay

## 2024-07-10 NOTE — Telephone Encounter (Addendum)
 Monica Miranda called: stated she has questions about the referrals Dr. Olla scheduled.   I have tried to reach the patient several times today.   There is no voice ID on her phone.  Therefore a  generic message has been left, for her to call the office back. She may ask for April who handles out-going referrals.  If I can assist in anyway I will.

## 2024-08-06 ENCOUNTER — Encounter: Payer: Self-pay | Admitting: Physical Medicine and Rehabilitation

## 2024-09-27 ENCOUNTER — Encounter: Attending: Physical Medicine and Rehabilitation | Admitting: Physical Medicine and Rehabilitation
